# Patient Record
Sex: Female | Born: 1965
Health system: Southern US, Community
[De-identification: ages and names within clinical notes are randomized; demographics above are authoritative.]

## PROBLEM LIST (undated history)

## (undated) DIAGNOSIS — G40909 Epilepsy, unspecified, not intractable, without status epilepticus: Secondary | ICD-10-CM

## (undated) DIAGNOSIS — F419 Anxiety disorder, unspecified: Secondary | ICD-10-CM

## (undated) DIAGNOSIS — R569 Unspecified convulsions: Secondary | ICD-10-CM

## (undated) DIAGNOSIS — F32A Depression, unspecified: Secondary | ICD-10-CM

## (undated) DIAGNOSIS — G039 Meningitis, unspecified: Secondary | ICD-10-CM

## (undated) DIAGNOSIS — K219 Gastro-esophageal reflux disease without esophagitis: Secondary | ICD-10-CM

## (undated) DIAGNOSIS — G43909 Migraine, unspecified, not intractable, without status migrainosus: Secondary | ICD-10-CM

## (undated) DIAGNOSIS — D496 Neoplasm of unspecified behavior of brain: Secondary | ICD-10-CM

## (undated) DIAGNOSIS — F329 Major depressive disorder, single episode, unspecified: Secondary | ICD-10-CM

## (undated) HISTORY — DX: Gastro-esophageal reflux disease without esophagitis: K21.9

## (undated) HISTORY — DX: Neoplasm of unspecified behavior of brain: D49.6

## (undated) HISTORY — DX: Major depressive disorder, single episode, unspecified: F32.9

## (undated) HISTORY — DX: Anxiety disorder, unspecified: F41.9

## (undated) HISTORY — DX: Epilepsy, unspecified, not intractable, without status epilepticus: G40.909

## (undated) HISTORY — DX: Meningitis, unspecified: G03.9

## (undated) HISTORY — DX: Migraine, unspecified, not intractable, without status migrainosus: G43.909

## (undated) HISTORY — DX: Depression, unspecified: F32.A

---

## 1998-03-03 ENCOUNTER — Other Ambulatory Visit: Admission: RE | Admit: 1998-03-03 | Discharge: 1998-03-03 | Payer: Self-pay | Admitting: Obstetrics and Gynecology

## 1998-09-02 ENCOUNTER — Encounter: Payer: Self-pay | Admitting: Obstetrics and Gynecology

## 1998-09-02 ENCOUNTER — Ambulatory Visit (HOSPITAL_COMMUNITY): Admission: RE | Admit: 1998-09-02 | Discharge: 1998-09-02 | Payer: Self-pay | Admitting: Obstetrics and Gynecology

## 1998-09-04 ENCOUNTER — Inpatient Hospital Stay (HOSPITAL_COMMUNITY): Admission: AD | Admit: 1998-09-04 | Discharge: 1998-09-07 | Payer: Self-pay | Admitting: Obstetrics & Gynecology

## 1998-09-04 ENCOUNTER — Encounter: Payer: Self-pay | Admitting: Obstetrics

## 1998-09-09 ENCOUNTER — Inpatient Hospital Stay (HOSPITAL_COMMUNITY): Admission: AD | Admit: 1998-09-09 | Discharge: 1998-09-09 | Payer: Self-pay | Admitting: Obstetrics & Gynecology

## 1998-09-13 ENCOUNTER — Encounter (HOSPITAL_COMMUNITY): Admission: RE | Admit: 1998-09-13 | Discharge: 1998-09-20 | Payer: Self-pay | Admitting: Obstetrics & Gynecology

## 1998-09-19 ENCOUNTER — Encounter: Payer: Self-pay | Admitting: Obstetrics & Gynecology

## 1998-09-19 ENCOUNTER — Inpatient Hospital Stay (HOSPITAL_COMMUNITY): Admission: AD | Admit: 1998-09-19 | Discharge: 1998-10-13 | Payer: Self-pay | Admitting: Obstetrics & Gynecology

## 1998-09-21 ENCOUNTER — Encounter: Payer: Self-pay | Admitting: Nephrology

## 1998-09-23 ENCOUNTER — Encounter: Payer: Self-pay | Admitting: Nephrology

## 1998-09-24 ENCOUNTER — Encounter: Payer: Self-pay | Admitting: *Deleted

## 1998-09-25 ENCOUNTER — Encounter: Payer: Self-pay | Admitting: Nephrology

## 1998-09-28 ENCOUNTER — Encounter: Payer: Self-pay | Admitting: Obstetrics & Gynecology

## 1998-10-01 ENCOUNTER — Encounter: Payer: Self-pay | Admitting: Nephrology

## 1998-10-19 ENCOUNTER — Encounter: Admission: RE | Admit: 1998-10-19 | Discharge: 1998-10-19 | Payer: Self-pay | Admitting: Family Medicine

## 1998-10-27 ENCOUNTER — Emergency Department (HOSPITAL_COMMUNITY): Admission: EM | Admit: 1998-10-27 | Discharge: 1998-10-27 | Payer: Self-pay | Admitting: Emergency Medicine

## 1998-10-28 ENCOUNTER — Encounter: Admission: RE | Admit: 1998-10-28 | Discharge: 1998-10-28 | Payer: Self-pay | Admitting: Sports Medicine

## 1998-11-03 ENCOUNTER — Encounter: Admission: RE | Admit: 1998-11-03 | Discharge: 1998-11-03 | Payer: Self-pay | Admitting: Family Medicine

## 1998-11-08 ENCOUNTER — Encounter: Admission: RE | Admit: 1998-11-08 | Discharge: 1998-11-08 | Payer: Self-pay | Admitting: Family Medicine

## 1998-11-11 ENCOUNTER — Encounter: Admission: RE | Admit: 1998-11-11 | Discharge: 1998-11-11 | Payer: Self-pay | Admitting: Family Medicine

## 1998-11-16 ENCOUNTER — Encounter: Admission: RE | Admit: 1998-11-16 | Discharge: 1998-11-16 | Payer: Self-pay | Admitting: Family Medicine

## 1999-01-03 ENCOUNTER — Encounter: Admission: RE | Admit: 1999-01-03 | Discharge: 1999-01-03 | Payer: Self-pay | Admitting: Family Medicine

## 1999-01-18 ENCOUNTER — Encounter: Admission: RE | Admit: 1999-01-18 | Discharge: 1999-01-18 | Payer: Self-pay | Admitting: Sports Medicine

## 1999-02-08 ENCOUNTER — Encounter: Admission: RE | Admit: 1999-02-08 | Discharge: 1999-02-08 | Payer: Self-pay | Admitting: Family Medicine

## 1999-03-15 ENCOUNTER — Encounter: Admission: RE | Admit: 1999-03-15 | Discharge: 1999-03-15 | Payer: Self-pay | Admitting: Family Medicine

## 1999-03-31 ENCOUNTER — Other Ambulatory Visit: Admission: RE | Admit: 1999-03-31 | Discharge: 1999-03-31 | Payer: Self-pay | Admitting: Family Medicine

## 1999-03-31 ENCOUNTER — Encounter: Admission: RE | Admit: 1999-03-31 | Discharge: 1999-03-31 | Payer: Self-pay | Admitting: Family Medicine

## 1999-05-09 ENCOUNTER — Encounter: Admission: RE | Admit: 1999-05-09 | Discharge: 1999-05-09 | Payer: Self-pay | Admitting: Family Medicine

## 1999-05-31 ENCOUNTER — Encounter: Admission: RE | Admit: 1999-05-31 | Discharge: 1999-05-31 | Payer: Self-pay | Admitting: Family Medicine

## 1999-05-31 ENCOUNTER — Other Ambulatory Visit: Admission: RE | Admit: 1999-05-31 | Discharge: 1999-05-31 | Payer: Self-pay | Admitting: Family Medicine

## 2000-03-19 ENCOUNTER — Encounter: Admission: RE | Admit: 2000-03-19 | Discharge: 2000-03-19 | Payer: Self-pay | Admitting: Family Medicine

## 2000-05-08 ENCOUNTER — Encounter: Admission: RE | Admit: 2000-05-08 | Discharge: 2000-05-08 | Payer: Self-pay | Admitting: Sports Medicine

## 2000-06-21 ENCOUNTER — Encounter: Admission: RE | Admit: 2000-06-21 | Discharge: 2000-06-21 | Payer: Self-pay | Admitting: Family Medicine

## 2000-10-05 ENCOUNTER — Encounter: Admission: RE | Admit: 2000-10-05 | Discharge: 2000-10-05 | Payer: Self-pay | Admitting: Family Medicine

## 2000-12-06 ENCOUNTER — Emergency Department (HOSPITAL_COMMUNITY): Admission: EM | Admit: 2000-12-06 | Discharge: 2000-12-06 | Payer: Self-pay | Admitting: Emergency Medicine

## 2001-01-01 ENCOUNTER — Encounter: Admission: RE | Admit: 2001-01-01 | Discharge: 2001-01-01 | Payer: Self-pay | Admitting: Family Medicine

## 2001-10-24 ENCOUNTER — Encounter: Admission: RE | Admit: 2001-10-24 | Discharge: 2001-10-24 | Payer: Self-pay | Admitting: Family Medicine

## 2002-01-27 ENCOUNTER — Encounter: Admission: RE | Admit: 2002-01-27 | Discharge: 2002-01-27 | Payer: Self-pay | Admitting: Family Medicine

## 2002-02-20 ENCOUNTER — Ambulatory Visit (HOSPITAL_COMMUNITY): Admission: RE | Admit: 2002-02-20 | Discharge: 2002-02-20 | Payer: Self-pay | Admitting: Family Medicine

## 2002-03-14 ENCOUNTER — Encounter: Admission: RE | Admit: 2002-03-14 | Discharge: 2002-03-14 | Payer: Self-pay | Admitting: Family Medicine

## 2002-03-31 ENCOUNTER — Encounter: Admission: RE | Admit: 2002-03-31 | Discharge: 2002-03-31 | Payer: Self-pay | Admitting: Family Medicine

## 2002-09-01 ENCOUNTER — Emergency Department (HOSPITAL_COMMUNITY): Admission: EM | Admit: 2002-09-01 | Discharge: 2002-09-01 | Payer: Self-pay | Admitting: Emergency Medicine

## 2002-09-12 ENCOUNTER — Encounter: Admission: RE | Admit: 2002-09-12 | Discharge: 2002-09-12 | Payer: Self-pay | Admitting: Family Medicine

## 2002-12-09 ENCOUNTER — Encounter: Admission: RE | Admit: 2002-12-09 | Discharge: 2002-12-09 | Payer: Self-pay | Admitting: Sports Medicine

## 2003-01-08 ENCOUNTER — Encounter: Admission: RE | Admit: 2003-01-08 | Discharge: 2003-01-08 | Payer: Self-pay | Admitting: Family Medicine

## 2003-02-18 ENCOUNTER — Encounter: Admission: RE | Admit: 2003-02-18 | Discharge: 2003-02-18 | Payer: Self-pay | Admitting: Family Medicine

## 2003-02-23 ENCOUNTER — Ambulatory Visit (HOSPITAL_COMMUNITY): Admission: RE | Admit: 2003-02-23 | Discharge: 2003-02-23 | Payer: Self-pay | Admitting: Sports Medicine

## 2003-02-26 ENCOUNTER — Encounter: Admission: RE | Admit: 2003-02-26 | Discharge: 2003-02-26 | Payer: Self-pay | Admitting: Family Medicine

## 2003-03-04 ENCOUNTER — Encounter: Admission: RE | Admit: 2003-03-04 | Discharge: 2003-03-04 | Payer: Self-pay | Admitting: Sports Medicine

## 2003-06-29 ENCOUNTER — Encounter: Admission: RE | Admit: 2003-06-29 | Discharge: 2003-06-29 | Payer: Self-pay | Admitting: Family Medicine

## 2004-05-09 ENCOUNTER — Ambulatory Visit (HOSPITAL_COMMUNITY): Admission: RE | Admit: 2004-05-09 | Discharge: 2004-05-09 | Payer: Self-pay | Admitting: Family Medicine

## 2004-06-27 ENCOUNTER — Emergency Department (HOSPITAL_COMMUNITY): Admission: EM | Admit: 2004-06-27 | Discharge: 2004-06-27 | Payer: Self-pay | Admitting: Emergency Medicine

## 2004-06-27 IMAGING — CT CT HEAD W/O CM
1 of 2 series · 13 of 30 positions shown, 17 images · IV contrast (agent unspecified)
Comparison: Previous study of [DATE] and to the report of the MRI of the head of [DATE].

CLINICAL DATA: Dizzy.  Weakness.  Headache.  Seizure yesterday.  History of seizures. 
 CT HEAD WITHOUT CONTRAST:

[Series 2: brain · axial · 0.47mm/px · z∈[+163,+284]mm · 13 of 28 slices shown, 17 images]
[im 2/28  brain]
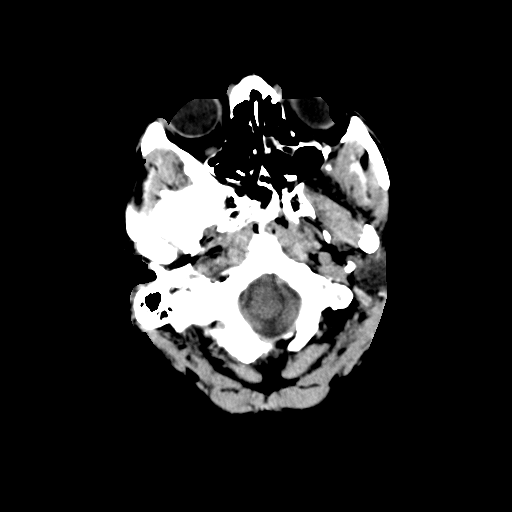
[im 2/28  bone]
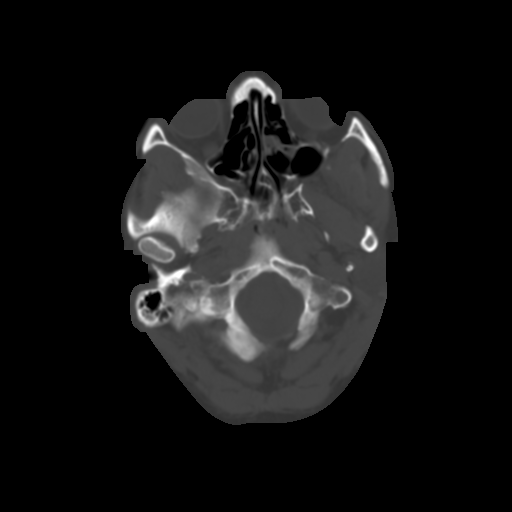
[im 4/28  brain]
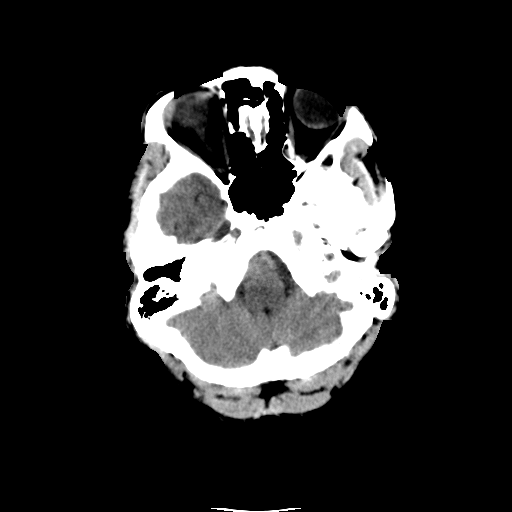
[im 6/28  brain]
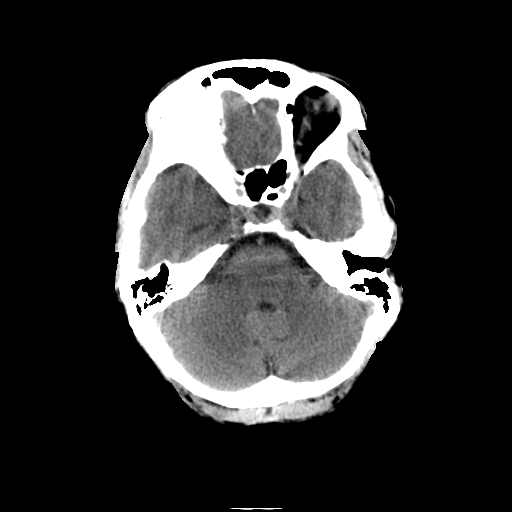
[im 8/28  brain]
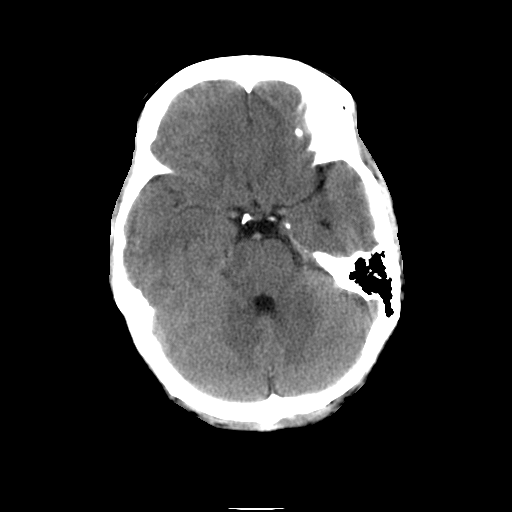
[im 10/28  brain]
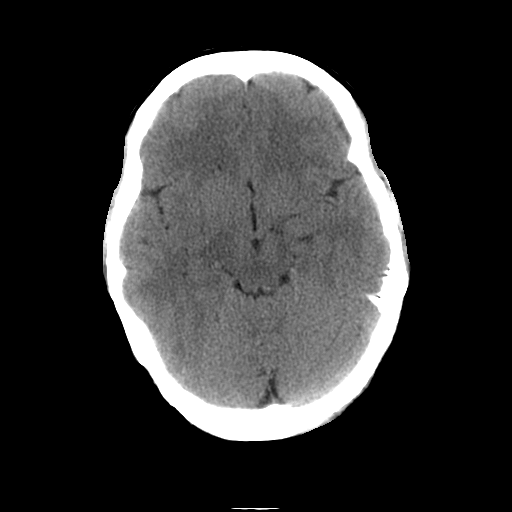
[im 10/28  bone]
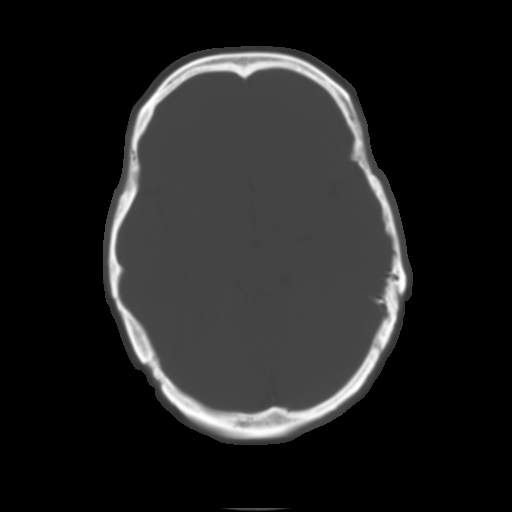
[im 12/28  brain]
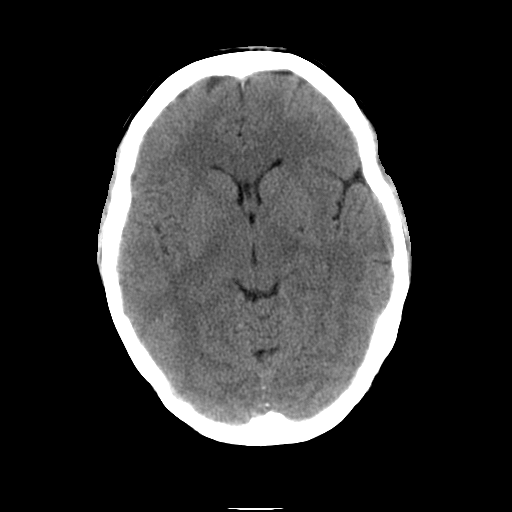
[im 14/28  brain]
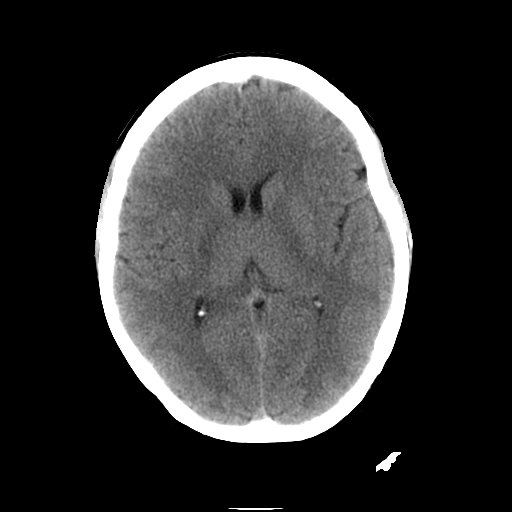
[im 16/28  brain]
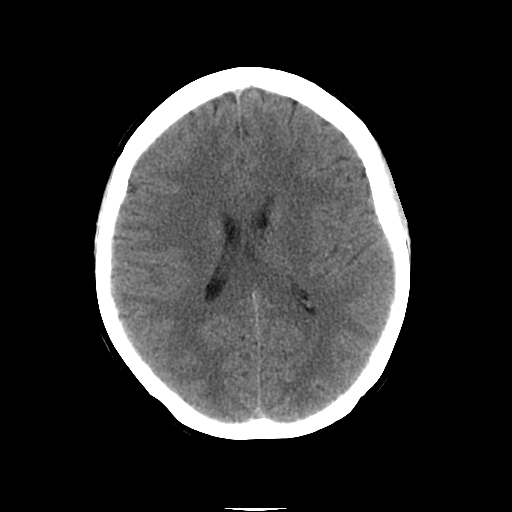
[im 18/28  brain]
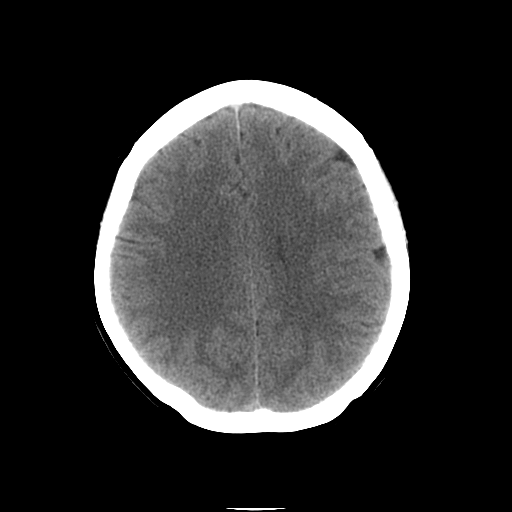
[im 18/28  bone]
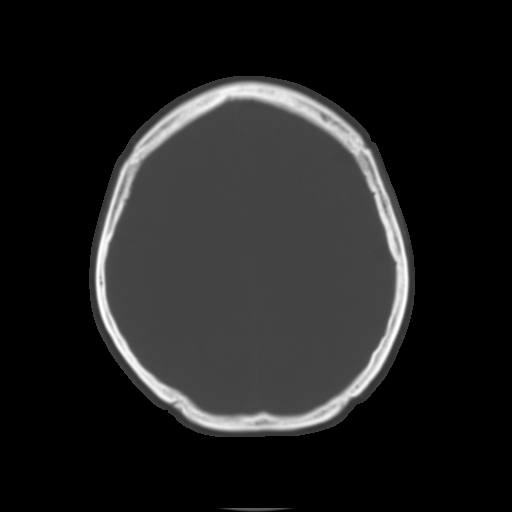
[im 20/28  brain]
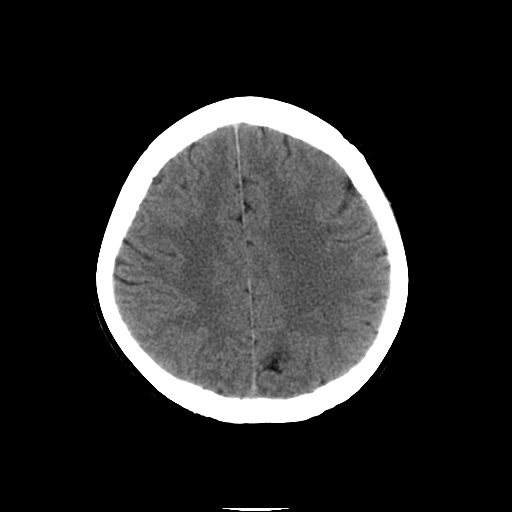
[im 22/28  brain]
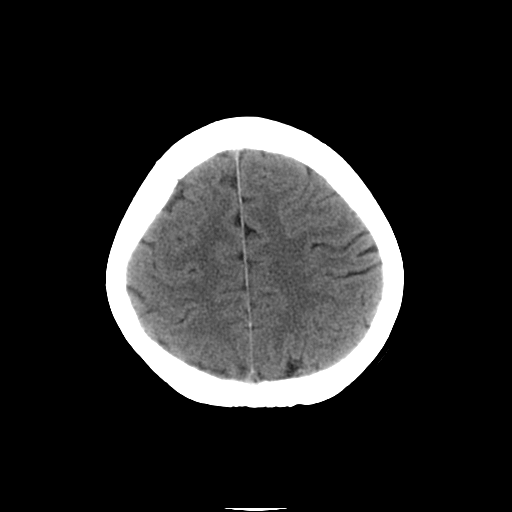
[im 24/28  brain]
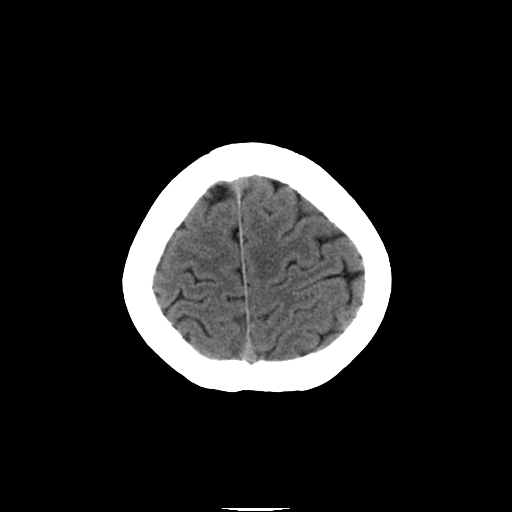
[im 26/28  brain]
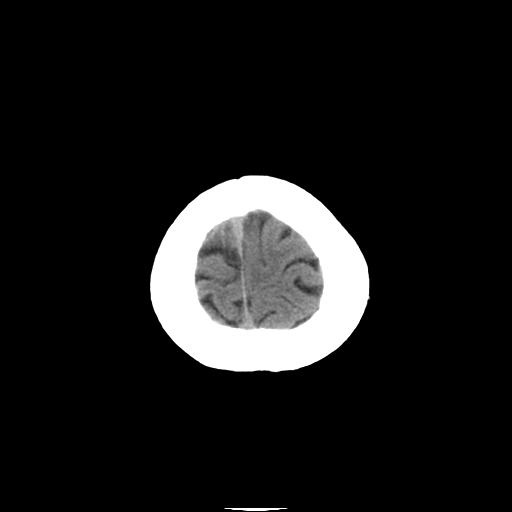
[im 26/28  bone]
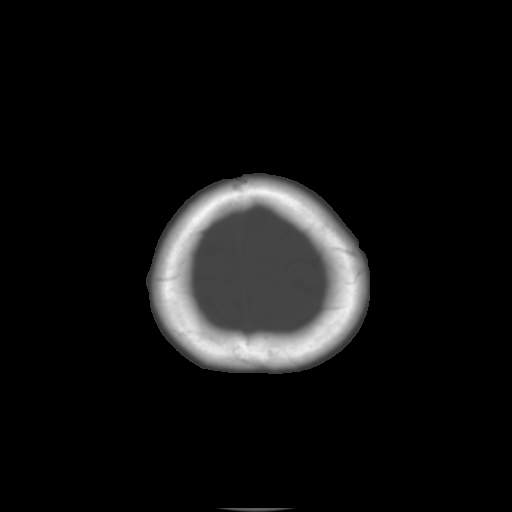

[13 of 30 positions shown; findings below may reference images not displayed]

A series of scans of the entire head are made without contrast and show that the area of low density in the left posteroparietal region near the vertex has not changed since [DATE].  There is no evidence of intracranial hemorrhage or mass.  There is no shift in the midline structures.  The ventricular system appears normal.  The bony calvarium is intact.  The base of the skull, internal auditory canals, and the paranasal sinuses appear normal as far as can be seen.
IMPRESSION: Stable area of low density, which may be an old area of brain atrophy, left posteroparietal region.  No acute intracranial findings are present.

## 2004-09-15 ENCOUNTER — Encounter (INDEPENDENT_AMBULATORY_CARE_PROVIDER_SITE_OTHER): Payer: Self-pay | Admitting: *Deleted

## 2004-09-15 LAB — CONVERTED CEMR LAB

## 2004-09-27 ENCOUNTER — Ambulatory Visit: Payer: Self-pay | Admitting: Family Medicine

## 2005-04-06 ENCOUNTER — Ambulatory Visit: Payer: Self-pay | Admitting: Sports Medicine

## 2005-04-08 ENCOUNTER — Emergency Department (HOSPITAL_COMMUNITY): Admission: EM | Admit: 2005-04-08 | Discharge: 2005-04-08 | Payer: Self-pay | Admitting: Emergency Medicine

## 2005-04-08 IMAGING — CR DG LUMBAR SPINE COMPLETE 4+V
5 series · 5 of 5 positions shown · non-contrast
Comparison: None.
 LUMBAR SPINE - 4 VIEW:
COMPARISON: None.

CLINICAL DATA: Low back pain.

[t l-spine a.p.]
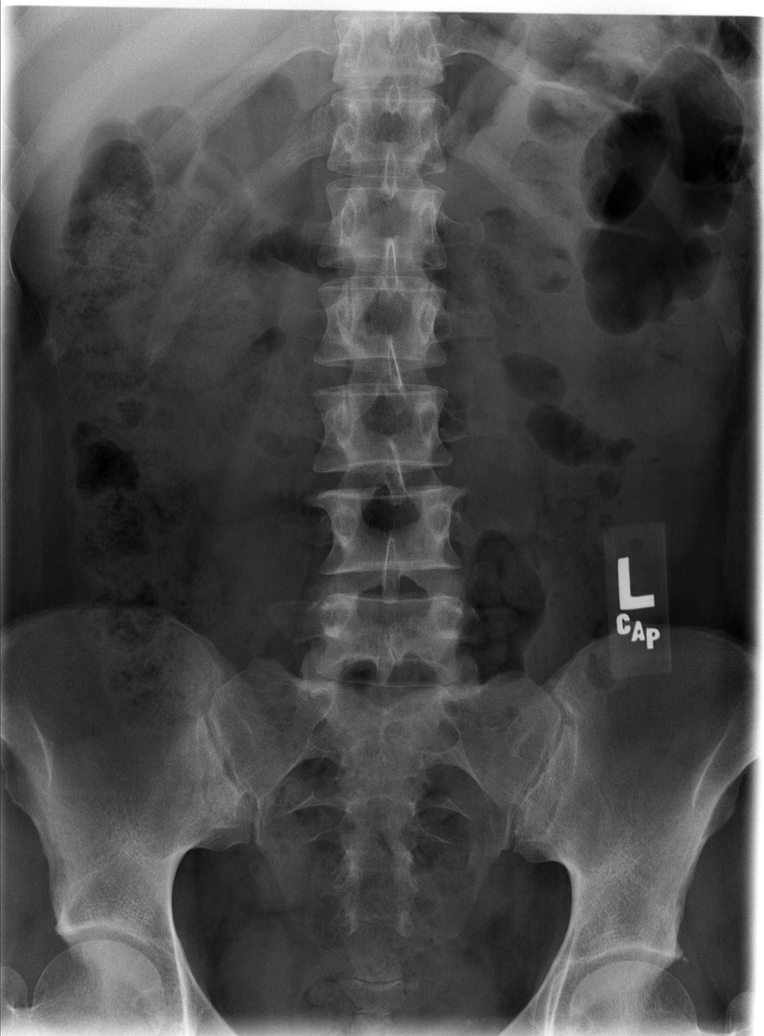

[t l-spine oblique exposure (1 of 2)]
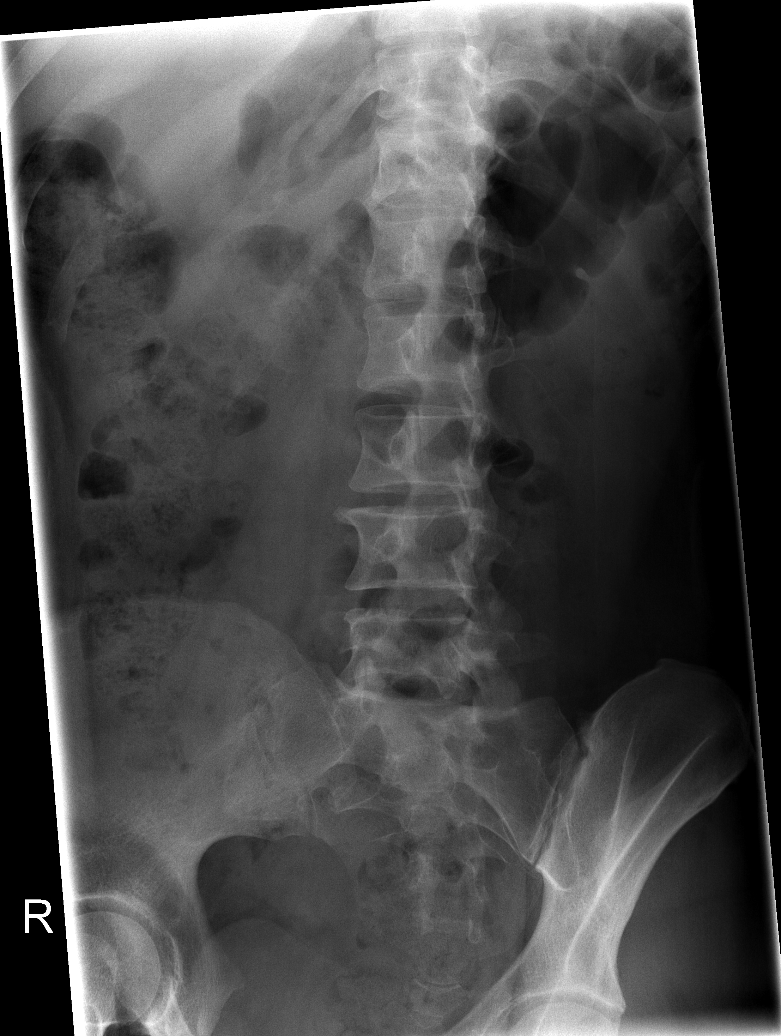

[t l-spine oblique exposure (2 of 2)]
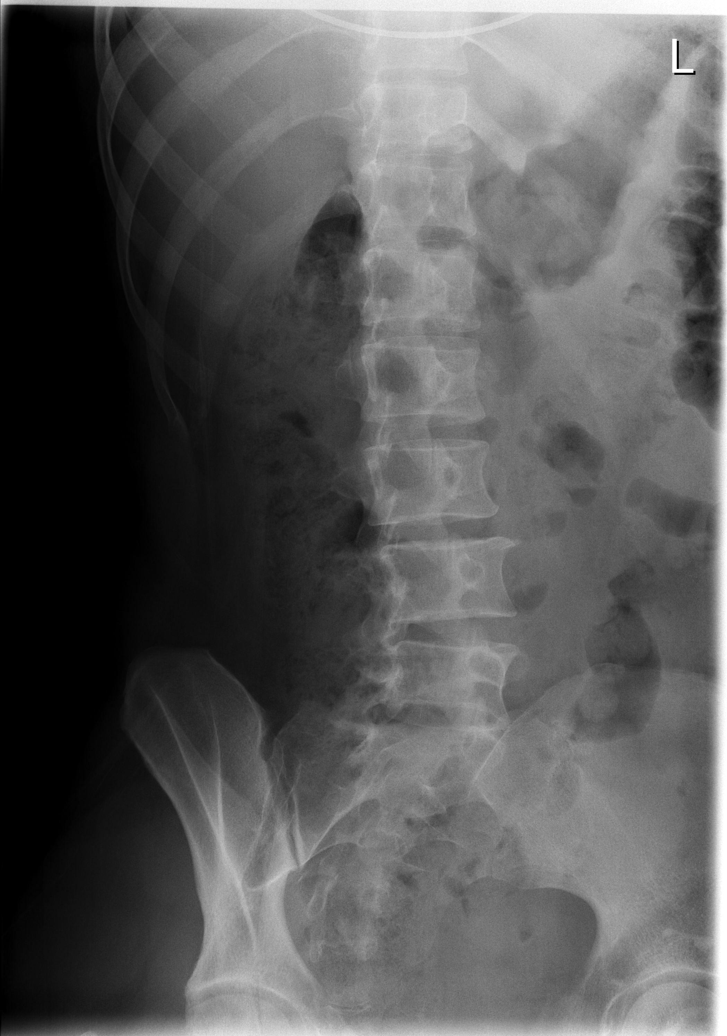

[t l-spine lat]
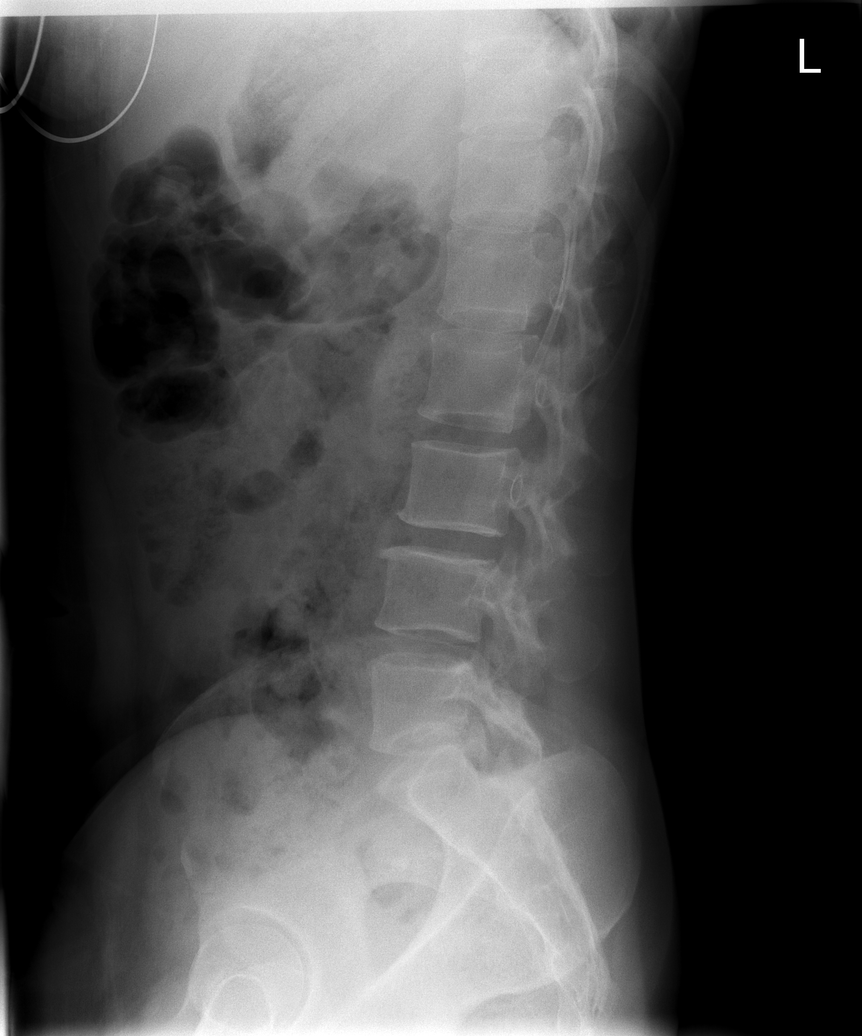

[t l-spine l5-s1 spot]
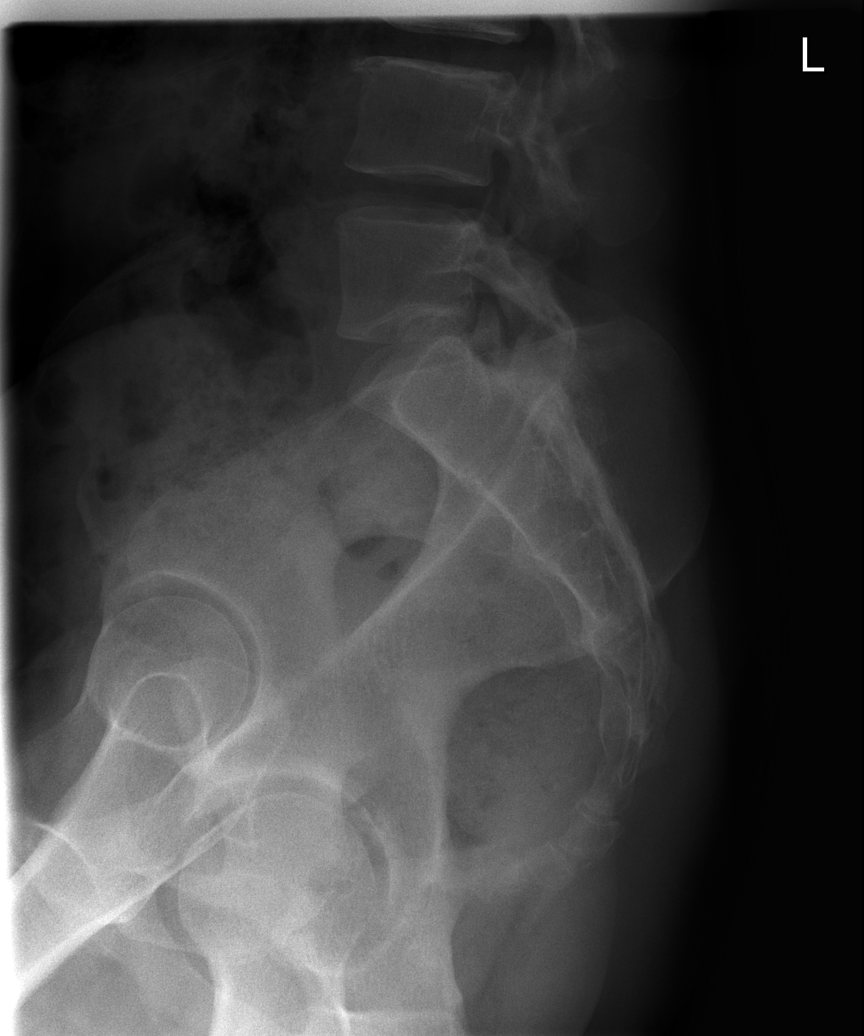

[5 of 5 positions shown; findings below may reference images not displayed]

FINDINGS: The alignment of the lumbar spine is normal.  
 The vertebral body heights and disk spaces are well preserved.
 No acute fractures are identified.
IMPRESSION: No acute findings.

## 2005-05-10 ENCOUNTER — Ambulatory Visit: Payer: Self-pay | Admitting: Family Medicine

## 2005-05-22 ENCOUNTER — Ambulatory Visit: Payer: Self-pay | Admitting: Sports Medicine

## 2005-05-22 ENCOUNTER — Encounter: Admission: RE | Admit: 2005-05-22 | Discharge: 2005-06-13 | Payer: Self-pay | Admitting: Family Medicine

## 2005-08-10 ENCOUNTER — Ambulatory Visit (HOSPITAL_COMMUNITY): Admission: RE | Admit: 2005-08-10 | Discharge: 2005-08-10 | Payer: Self-pay | Admitting: Family Medicine

## 2005-08-10 ENCOUNTER — Ambulatory Visit: Payer: Self-pay | Admitting: Family Medicine

## 2005-08-10 IMAGING — MG MM DIGITAL SCREENING BILAT
4 series · 4 of 4 positions shown · non-contrast
Comparison: none

DG SCREEN MAMMOGRAM BILATERAL
Bilateral CC and MLO view(s) were taken.

SCREENING MAMMOGRAM:
There is a fibroglandular pattern.  No masses or malignant type calcifications are identified.  
Compared with prior studies.

[R CC]
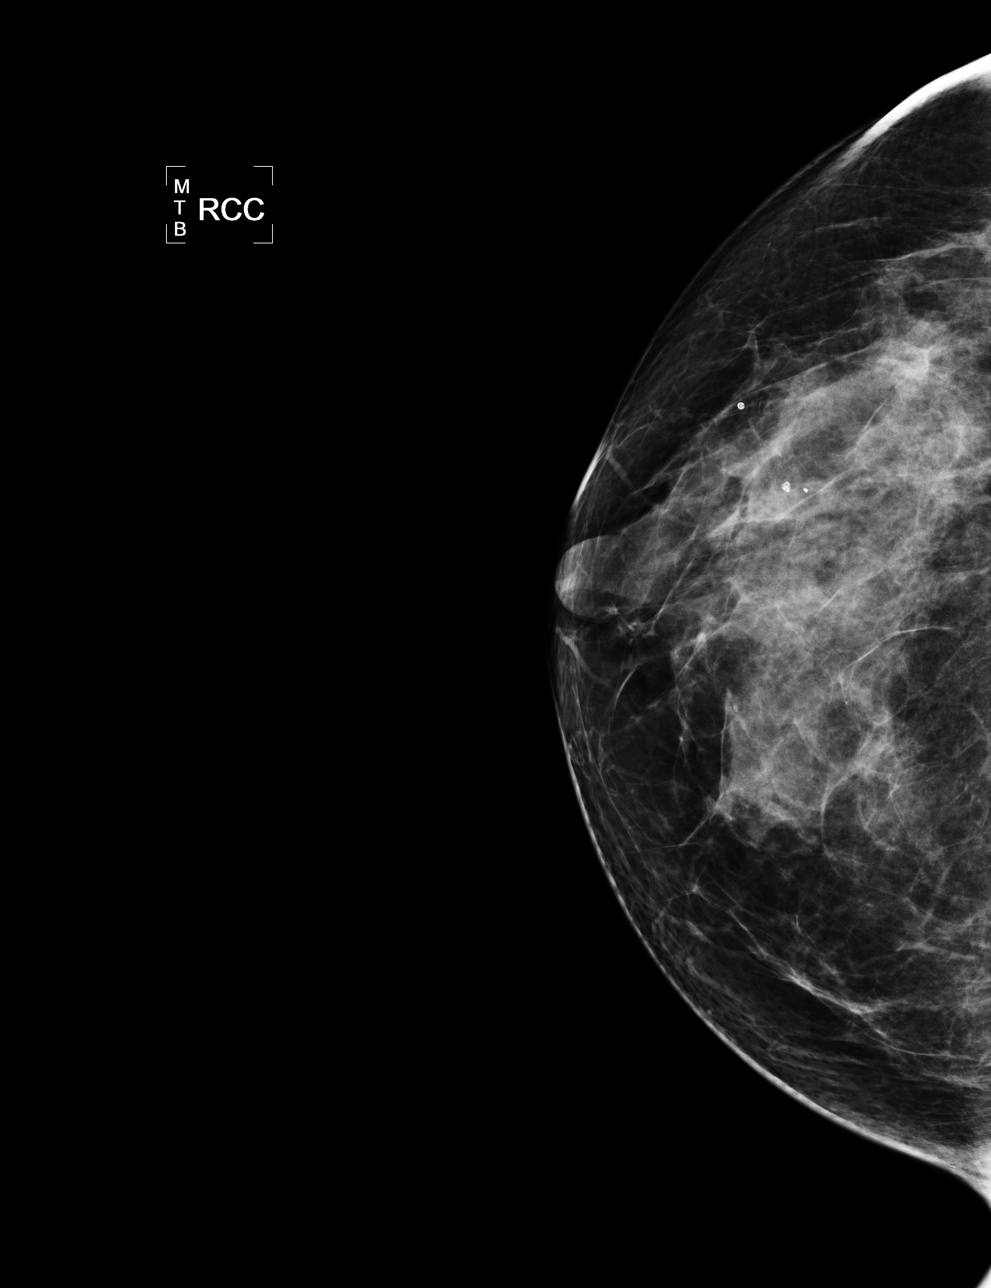

[R MLO]
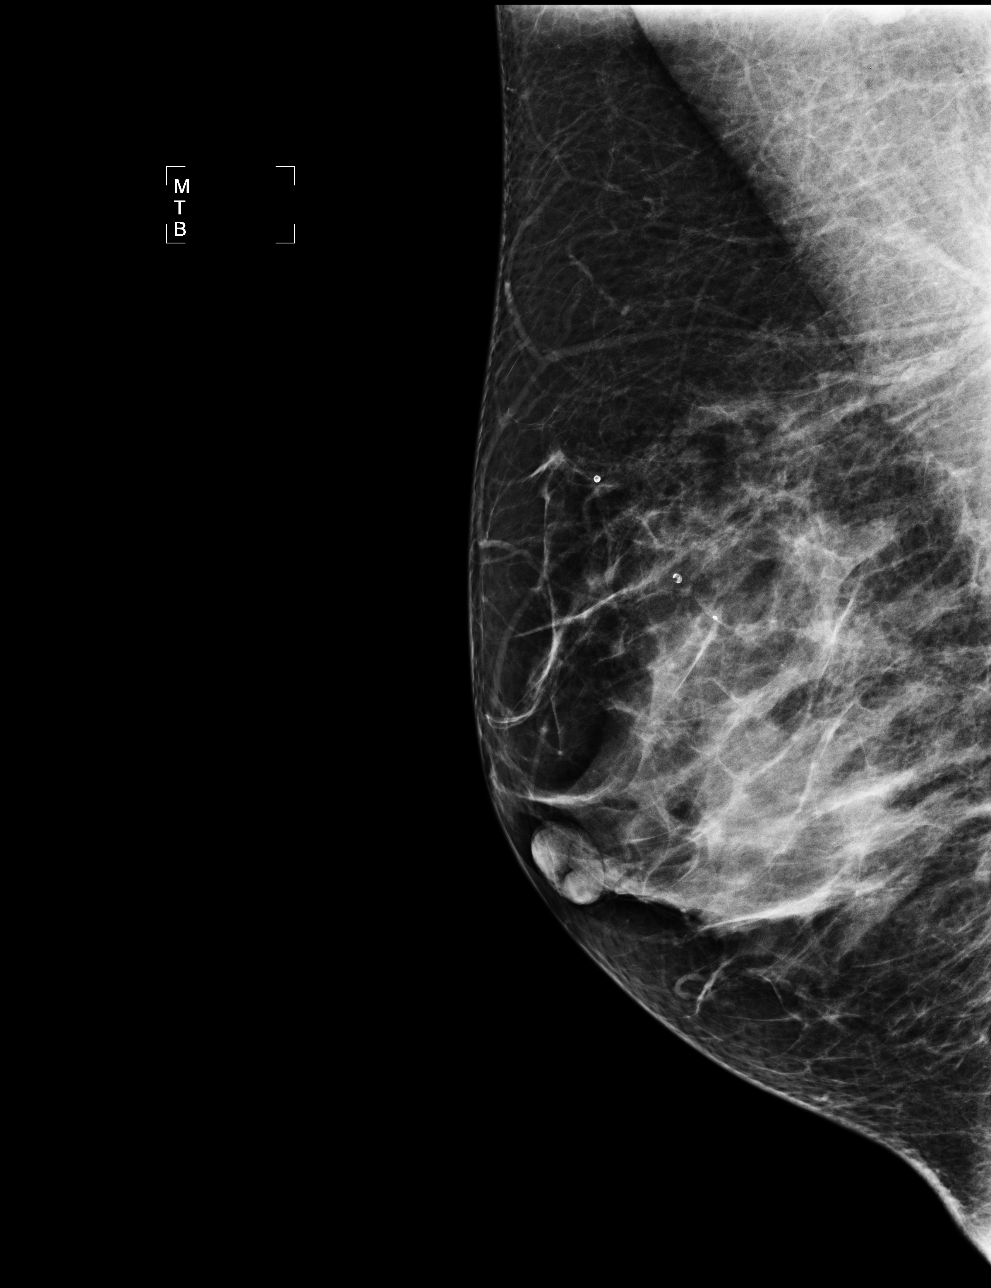

[L CC]
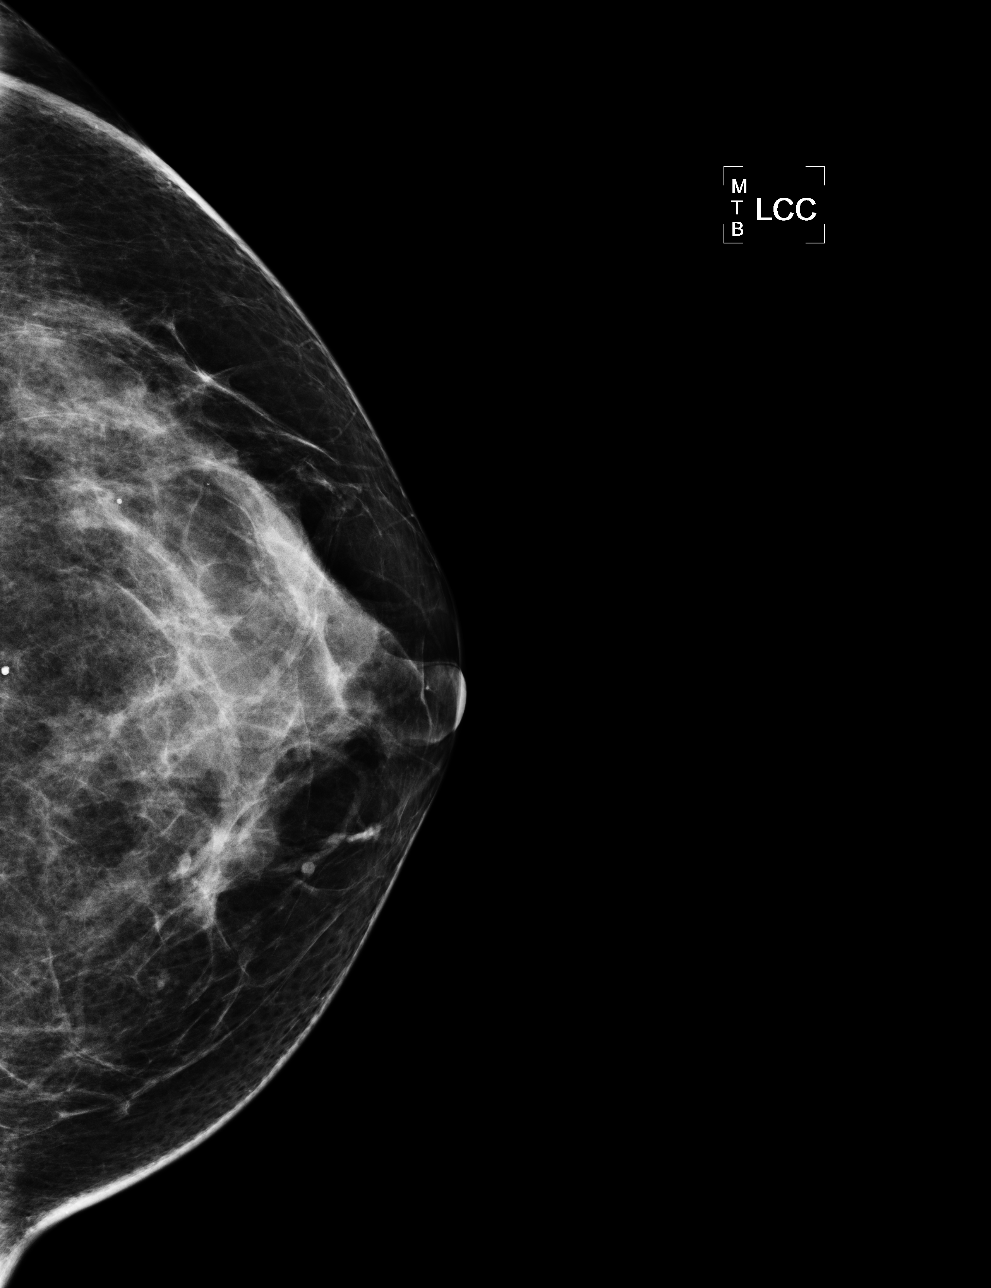

[L MLO]
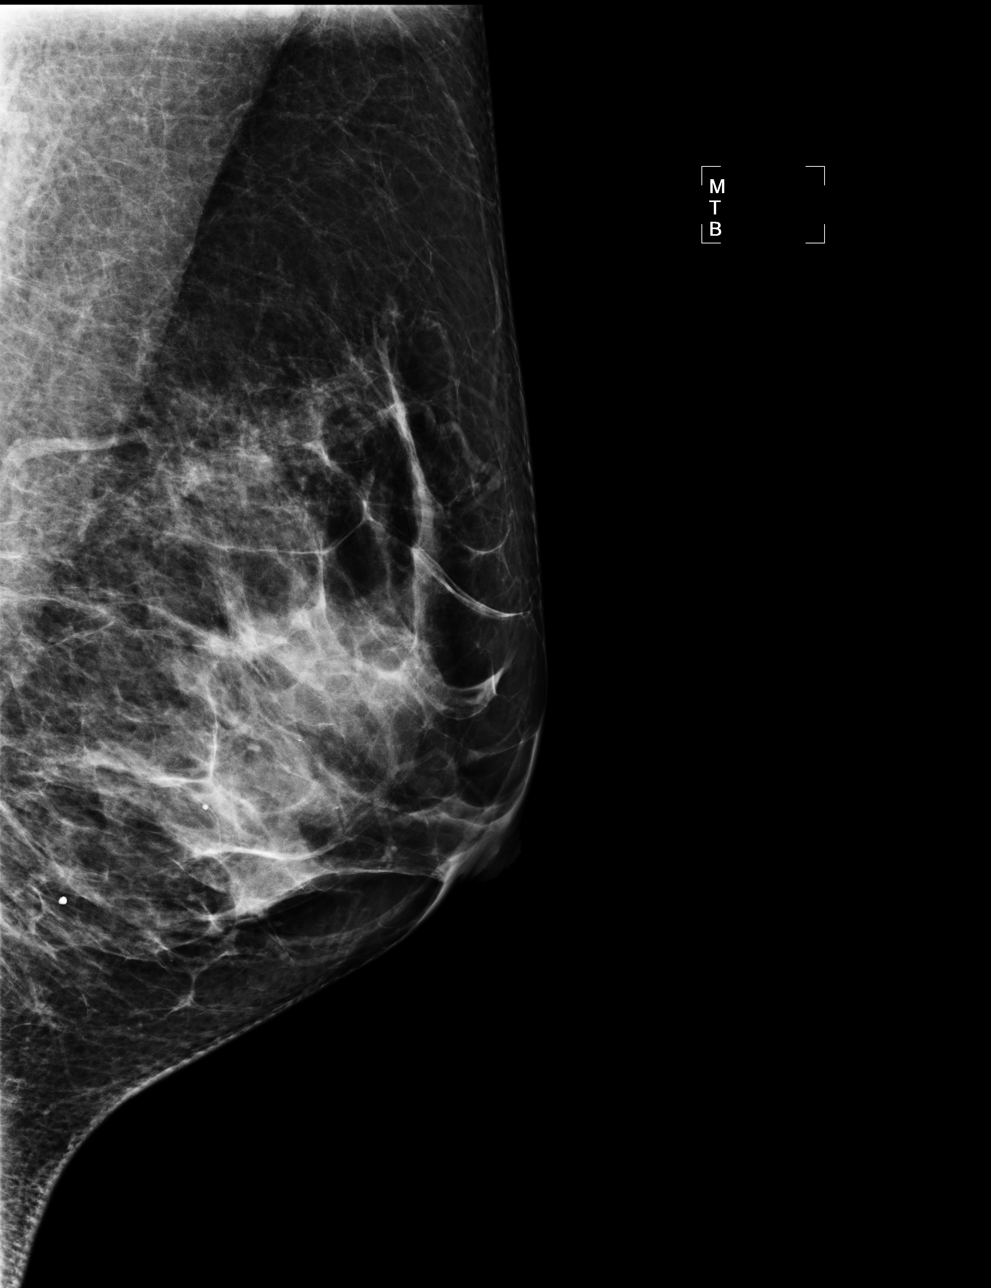

[4 of 4 positions shown; findings below may reference images not displayed]

IMPRESSION: No specific mammographic evidence of malignancy.  Next screening mammogram is recommended in one 
year.

ASSESSMENT: Negative - BI-RADS 1

Screening mammogram in 1 year.
ANALYZED BY COMPUTER AIDED DETECTION. , THIS PROCEDURE WAS A DIGITAL MAMMOGRAM.

## 2005-08-31 ENCOUNTER — Ambulatory Visit (HOSPITAL_COMMUNITY): Admission: RE | Admit: 2005-08-31 | Discharge: 2005-08-31 | Payer: Self-pay | Admitting: Sports Medicine

## 2005-09-05 ENCOUNTER — Ambulatory Visit (HOSPITAL_BASED_OUTPATIENT_CLINIC_OR_DEPARTMENT_OTHER): Admission: RE | Admit: 2005-09-05 | Discharge: 2005-09-05 | Payer: Self-pay | Admitting: Family Medicine

## 2005-09-10 ENCOUNTER — Ambulatory Visit: Payer: Self-pay | Admitting: Internal Medicine

## 2005-11-17 ENCOUNTER — Ambulatory Visit: Payer: Self-pay

## 2006-02-21 ENCOUNTER — Ambulatory Visit: Payer: Self-pay | Admitting: Family Medicine

## 2006-02-26 ENCOUNTER — Ambulatory Visit: Payer: Self-pay | Admitting: Sports Medicine

## 2006-05-11 ENCOUNTER — Encounter (INDEPENDENT_AMBULATORY_CARE_PROVIDER_SITE_OTHER): Payer: Self-pay | Admitting: *Deleted

## 2006-06-29 ENCOUNTER — Telehealth (INDEPENDENT_AMBULATORY_CARE_PROVIDER_SITE_OTHER): Payer: Self-pay | Admitting: Family Medicine

## 2006-07-04 ENCOUNTER — Telehealth (INDEPENDENT_AMBULATORY_CARE_PROVIDER_SITE_OTHER): Payer: Self-pay | Admitting: Family Medicine

## 2006-07-05 ENCOUNTER — Encounter (INDEPENDENT_AMBULATORY_CARE_PROVIDER_SITE_OTHER): Payer: Self-pay | Admitting: Family Medicine

## 2007-12-01 DIAGNOSIS — Z209 Contact with and (suspected) exposure to unspecified communicable disease: Secondary | ICD-10-CM | POA: Insufficient documentation

## 2011-03-14 DIAGNOSIS — G039 Meningitis, unspecified: Secondary | ICD-10-CM

## 2011-03-14 DIAGNOSIS — D496 Neoplasm of unspecified behavior of brain: Secondary | ICD-10-CM

## 2011-03-14 HISTORY — DX: Neoplasm of unspecified behavior of brain: D49.6

## 2011-03-14 HISTORY — DX: Meningitis, unspecified: G03.9

## 2011-04-11 ENCOUNTER — Emergency Department (HOSPITAL_COMMUNITY)
Admission: EM | Admit: 2011-04-11 | Discharge: 2011-04-11 | Disposition: A | Discharge status: Home / Self Care | Payer: PRIVATE HEALTH INSURANCE | Source: Home / Self Care | Attending: Family Medicine | Admitting: Family Medicine

## 2011-04-11 ENCOUNTER — Encounter (HOSPITAL_COMMUNITY): Payer: Self-pay | Admitting: Emergency Medicine

## 2011-04-11 DIAGNOSIS — J069 Acute upper respiratory infection, unspecified: Secondary | ICD-10-CM

## 2011-04-11 HISTORY — DX: Unspecified convulsions: R56.9

## 2011-04-11 MED ORDER — CETIRIZINE HCL 10 MG PO TABS: 10.0000 mg | ORAL_TABLET | Freq: Every day | ORAL | Status: DC

## 2011-04-11 MED ORDER — IPRATROPIUM BROMIDE 0.06 % NA SOLN: 2.0000 | Freq: Four times a day (QID) | NASAL | Status: DC

## 2011-04-11 NOTE — ED Provider Notes (Signed)
History     CSN: [id number]  Arrival date & time 04/11/11  1218   First MD Initiated Contact with Patient 04/11/11 1221      Chief Complaint  Patient presents with  . URI    (Consider location/radiation/quality/duration/timing/severity/associated sxs/prior treatment) Patient is a 46 y.o. female presenting with URI. The history is provided by the patient.  URI Primary symptoms do not include fever, sore throat, cough or wheezing. The current episode started 2 days ago. This is a new problem. The problem has not changed since onset. The onset of the illness is associated with exposure to sick contacts. Symptoms associated with the illness include congestion and rhinorrhea.    History reviewed. No pertinent past medical history.  History reviewed. No pertinent past surgical history.  No family history on file.  History  Substance Use Topics  . Smoking status: Not on file  . Smokeless tobacco: Not on file  . Alcohol Use: Not on file    OB History    Grav Para Term Preterm Abortions TAB SAB Ect Mult Living                  Review of Systems  Constitutional: Negative.  Negative for fever.  HENT: Positive for congestion, rhinorrhea and postnasal drip. Negative for sore throat.   Respiratory: Negative for cough and wheezing.   Gastrointestinal: Negative.     Allergies  Review of patient's allergies indicates no known allergies.  Home Medications   Current Outpatient Rx  Name Route Sig Dispense Refill  . CETIRIZINE HCL 10 MG PO TABS Oral Take 1 tablet (10 mg total) by mouth daily. 30 tablet 1  . IPRATROPIUM BROMIDE 0.06 % NA SOLN Nasal Place 2 sprays into the nose 4 (four) times daily. 15 mL 12    BP 93/58  Pulse 85  Temp(Src) 98.4 F (36.9 C) (Oral)  Resp 18  SpO2 100%  LMP 03/27/2011  Physical Exam  Nursing note and vitals reviewed. Constitutional: She appears well-developed and well-nourished.  HENT:  Head: Normocephalic.  Right Ear: External ear  normal.  Left Ear: External ear normal.  Nose: Mucosal edema and rhinorrhea present.  Mouth/Throat: Oropharynx is clear and moist.  Neck: Normal range of motion. Neck supple.  Pulmonary/Chest: Effort normal and breath sounds normal.  Lymphadenopathy:    She has no cervical adenopathy.  Skin: Skin is warm and dry.    ED Course  Procedures (including critical care time)  Labs Reviewed - No data to display No results found.   1. URI (upper respiratory infection)       MDM          Barkley Bruns, MD 04/11/11 [phone]

## 2011-04-11 NOTE — ED Notes (Signed)
Reports 2 day history of : head and throat congestion, runny nose, post nasal drainage, facial pressure and pain.  Also c/o sore throat, dry cough, denies fever.

## 2012-03-13 HISTORY — PX: CT RADIATION THERAPY GUIDE: HXRAD513

## 2012-03-13 HISTORY — PX: BRAIN SURGERY: SHX531

## 2015-08-13 DIAGNOSIS — K2100 Gastro-esophageal reflux disease with esophagitis, without bleeding: Secondary | ICD-10-CM | POA: Insufficient documentation

## 2015-08-17 DIAGNOSIS — A048 Other specified bacterial intestinal infections: Secondary | ICD-10-CM | POA: Insufficient documentation

## 2015-10-01 DIAGNOSIS — G43909 Migraine, unspecified, not intractable, without status migrainosus: Secondary | ICD-10-CM | POA: Insufficient documentation

## 2015-10-01 DIAGNOSIS — G40109 Localization-related (focal) (partial) symptomatic epilepsy and epileptic syndromes with simple partial seizures, not intractable, without status epilepticus: Secondary | ICD-10-CM | POA: Insufficient documentation

## 2015-10-01 LAB — BASIC METABOLIC PANEL
BUN: 14 (ref 4–21)
Creatinine: 0.8 (ref <=1.1)
GLUCOSE: 92
POTASSIUM: 3.8 (ref 3.4–5.3)
SODIUM: 142 (ref 137–147)

## 2015-10-01 LAB — HEPATIC FUNCTION PANEL
ALT: 20 (ref 7–35)
AST: 19 (ref 13–35)
Alkaline Phosphatase: 51 (ref 25–125)
BILIRUBIN, TOTAL: 0.6

## 2015-10-08 LAB — CBC AND DIFFERENTIAL
HEMATOCRIT: 37 (ref 36–46)
HEMOGLOBIN: 12.4 (ref 12.0–16.0)
Platelets: 269 (ref 150–399)
WBC: 5.4

## 2016-04-21 LAB — TSH: TSH: 1.63 (ref <=5.90)

## 2016-06-23 DIAGNOSIS — M7989 Other specified soft tissue disorders: Secondary | ICD-10-CM | POA: Insufficient documentation

## 2016-06-23 DIAGNOSIS — F339 Major depressive disorder, recurrent, unspecified: Secondary | ICD-10-CM | POA: Insufficient documentation

## 2016-07-07 DIAGNOSIS — D329 Benign neoplasm of meninges, unspecified: Secondary | ICD-10-CM | POA: Insufficient documentation

## 2016-07-07 LAB — HM PAP SMEAR: HM PAP: NEGATIVE

## 2016-11-03 MED FILL — levETIRAcetam 500 MG TABS: 500 | 90 days supply | Qty: 180 | Fill #0

## 2016-11-03 MED FILL — TOPIRAMATE 50 MG TABLET: 50 | 90 days supply | Qty: 180 | Fill #0

## 2017-01-29 MED FILL — TOPIRAMATE 50 MG TABLET: 50 | 90 days supply | Qty: 180 | Fill #1

## 2017-02-12 ENCOUNTER — Ambulatory Visit: Payer: Self-pay | Admitting: Family Medicine

## 2017-02-12 ENCOUNTER — Encounter: Payer: Self-pay | Admitting: Family Medicine

## 2017-02-12 ENCOUNTER — Other Ambulatory Visit: Payer: Self-pay | Admitting: *Deleted

## 2017-02-12 VITALS — BP 92/60 | HR 86 | Temp 98.2°F | Ht 62.5 in | Wt 135.5 lb

## 2017-02-12 DIAGNOSIS — G8929 Other chronic pain: Secondary | ICD-10-CM | POA: Insufficient documentation

## 2017-02-12 DIAGNOSIS — D171 Benign lipomatous neoplasm of skin and subcutaneous tissue of trunk: Secondary | ICD-10-CM | POA: Insufficient documentation

## 2017-02-12 DIAGNOSIS — Z23 Encounter for immunization: Secondary | ICD-10-CM

## 2017-02-12 DIAGNOSIS — G40109 Localization-related (focal) (partial) symptomatic epilepsy and epileptic syndromes with simple partial seizures, not intractable, without status epilepticus: Secondary | ICD-10-CM

## 2017-02-12 DIAGNOSIS — F339 Major depressive disorder, recurrent, unspecified: Secondary | ICD-10-CM

## 2017-02-12 DIAGNOSIS — D329 Benign neoplasm of meninges, unspecified: Secondary | ICD-10-CM

## 2017-02-12 DIAGNOSIS — M545 Low back pain, unspecified: Secondary | ICD-10-CM | POA: Insufficient documentation

## 2017-02-12 MED ORDER — TOPIRAMATE 50 MG PO TABS: 50.0000 mg | ORAL_TABLET | Freq: Two times a day (BID) | ORAL | 1 refills | Status: DC

## 2017-02-12 MED ORDER — SERTRALINE HCL 50 MG PO TABS: 150.0000 mg | ORAL_TABLET | Freq: Every day | ORAL | 5 refills | Status: DC

## 2017-02-12 MED ORDER — SUMATRIPTAN SUCCINATE 100 MG PO TABS: ORAL_TABLET | ORAL | 1 refills | Status: DC

## 2017-02-12 MED FILL — SERTRALINE HCL 50 MG TABLET: 50 | 60 days supply | Qty: 180 | Fill #0

## 2017-02-12 MED FILL — SUMATRIPTAN SUCC 100 MG TAB: 100 | 30 days supply | Qty: 10 | Fill #0

## 2017-02-12 NOTE — Progress Notes (Signed)
Subjective  CC:  Chief Complaint  Patient presents with  . Establish Care    Self pay    HPI: Victoria Bishop is a 51 y.o. female who presents to [place] [facility] today to establish care with me as a new patient. She recently moved back to [place] from [place]. She was followed by [facility] in the past.  She has the following concerns or needs:   New dx of depression and anxiety since march of 2018 - started on sertraline which has been helpful. Reports sxs of low mood but more related to constant worry interfering with sleep. She and daughter relate much of this to being in [place] away from her family who live here. She spent most days alone while her husband was at work. She denies a prior h/o mood disorder; she denies prior RX meds. No SI. The sertraline has improved her anxiety but not completely. Still with sleep problems. Her phq-9 and GAD were positive.  Depression screen PHQ 2/9 02/12/2017  Decreased Interest 2  Down, Depressed, Hopeless 2  PHQ - 2 Score 4  Altered sleeping 2  Tired, decreased energy 1  Change in appetite 1  Feeling bad or failure about yourself  0  Trouble concentrating 1  Moving slowly or fidgety/restless 3  Suicidal thoughts 1  PHQ-9 Score 13  Difficult doing work/chores Very difficult   GAD 7 : Generalized Anxiety Score 02/12/2017  Nervous, Anxious, on Edge 2  Control/stop worrying 2  Worry too much - different things 2  Trouble relaxing 1  Restless 1  Easily annoyed or irritable 2  Afraid - awful might happen 1  Total GAD 7 Score 11  Anxiety Difficulty Very difficult     Seizure d/o - needs to see a neurologist. She stopped keppra due to mood swings; hasn't taken since February. Last sleep deprived EEG was normal: 02/2016. This is secondary to meningioma. She requests [facility], Dr. [name]. She has been to [place] in the distant past.  Migraines on topamax but says are not well controlled. Having 2-3 / month. Unilateral with  n/v. Has imitrex for rescue. Triggers: stress and lack of sleep.  GERD is well controlled with diet. Zantac prn. HM is up to date with last CPE in April but due for mammogram and flu shot. I've personally reviewed recent office visit notes, hospital notes, associated labs and imaging reports and/or pertinent outside office records via chart review or CareEverywhere. She is now working [employer] and reports a neg TB test and td shot then. She will get the records. ROS: + for LBP on right. H/o OA and sciatica. Thinks it is related to "cyst" on right low back - uses voltaren gel prn which helps.   We updated and reviewed the patient's past history in detail and it is documented below.  Patient Active Problem List   Diagnosis Date Noted  . Chronic right-sided low back pain without sciatica 02/12/2017  . Lipoma of torso 02/12/2017  . Meningioma ([place]) 07/07/2016  . Major depression, recurrent, chronic ([place]) 06/23/2016  . Soft tissue mass 06/23/2016  . 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 Migraine without status migrainosus, not intractable 10/01/2015  . Helicobacter pylori (H. pylori) infection 08/17/2015  . Gastroesophageal reflux disease with esophagitis 08/13/2015  . Pterygium 08/23/2009    Health Maintenance  Topic Date Due  . Hepatitis C Screening  Mar 21, 1965  . HIV Screening  01/10/1979  . MAMMOGRAM  03/24/2016  .  PAP SMEAR  06/22/2020  . TETANUS/TDAP  02/09/2021  . COLONOSCOPY  04/29/2025  . INFLUENZA VACCINE  Completed   Immunization History  Administered Date(s) Administered  . Influenza Split 11/29/2007, 01/22/2009, 12/31/2009, 12/23/2010  . Influenza,inj,Quad PF,6+ Mos 12/31/2014, 02/12/2017  . Influenza,inj,quad, With Preservative 01/21/2016  . Tdap 02/10/2011   Current Meds  Medication Sig  . acetaminophen (TYLENOL) 325 MG tablet Take by mouth.  . diclofenac sodium (VOLTAREN) 1 % GEL APPLY 2G THREE TIMES DAILY AS NEEDED FOR PAIN.  . ranitidine (ZANTAC) 150 MG  capsule Take 150 mg by mouth as needed.   . sertraline (ZOLOFT) 50 MG tablet Take 3 tablets (150 mg total) by mouth at bedtime.  . SUMAtriptan (IMITREX) 100 MG tablet Take 1 tab by mouth at first sign of migraine-May repeat in 2 hrs-MAX 2 tabs/24 hrs  . topiramate (TOPAMAX) 50 MG tablet Take 1 tablet (50 mg total) by mouth 2 (two) times daily.  . [DISCONTINUED] sertraline (ZOLOFT) 50 MG tablet Take 75 mg by mouth daily.   . [DISCONTINUED] SUMAtriptan (IMITREX) 100 MG tablet Take 1 tab by mouth at first sign of migraine-May repeat in 2 hrs-MAX 2 tabs/24 hrs  . [DISCONTINUED] topiramate (TOPAMAX) 50 MG tablet TAKE 1 TABLET BY MOUTH EVERY 12 HOURS WILL NEED LOCAL PROVIDER FOR FURTHER REFILLS    Allergies: Patient has No Known Allergies. Past Medical History Patient  has a past medical history of Anxiety, Brain tumor ([place]) (2013), Depression, Epilepsy ([place]), GERD (gastroesophageal reflux disease), Meningitis (2013), Migraines, and Seizures ([place]). Past Surgical History Patient  has a past surgical history that includes Cesarean section; Brain surgery (2014); and CT RADIATION THERAPY GUIDE (2014). Family History: Patient family history includes Anxiety disorder in her daughter and son; Hearing loss in her son; Hyperlipidemia in her father; Hypertension in her father and mother; Other in her daughter. Social History:  Patient  reports that  has never smoked. she has never used smokeless tobacco. She reports that she does not drink alcohol or use drugs.  Review of Systems: Constitutional: negative for fever or malaise Ophthalmic: negative for photophobia, double vision or loss of vision Cardiovascular: negative for chest pain, dyspnea on exertion, or new LE swelling Respiratory: negative for SOB or persistent cough Gastrointestinal: negative for abdominal pain, change in bowel habits or melena Genitourinary: negative for dysuria or gross hematuria Musculoskeletal: negative for new gait disturbance  or muscular weakness Integumentary: negative for new or persistent rashes Neurological: negative for TIA or stroke symptoms Psychiatric: negative for SI or delusions, positive for worry Allergic/Immunologic: negative for hives  Patient Care Team    Relationship Specialty Notifications Start End  Leamon Arnt, MD PCP - General Family Medicine  02/12/17     Objective  Vitals: BP 92/60 (BP Location: Left Arm, Patient Position: Sitting, Cuff Size: Normal)   Pulse 86   Temp 98.2 F (36.8 C) (Oral)   Ht 5' 2.5" (1.588 m)   Wt 135 lb 8 oz (61.5 kg)   LMP 07/12/2014 (LMP Unknown)   SpO2 96%   BMI 24.39 kg/m  General:  Well developed, well nourished, no acute distress  Psych:  Alert and oriented,normal mood and affect HEENT:  Normocephalic, atraumatic, non-icteric sclera, PERRL, oropharynx is without mass or exudate, supple neck without adenopathy, mass or thyromegaly Cardiovascular:  RRR without gallop, rub or murmur, nondisplaced PMI Respiratory:  Good breath sounds bilaterally, CTAB with normal respiratory effort Gastrointestinal: normal bowel sounds, soft, non-tender, no noted masses. No HSM MSK: no  deformities, contusions. Joints are without erythema or swelling, neg sLR bilaterall. Skin:  Warm, no rashes or suspicious lesions noted, right low back with 3cm mobile subdermal mass c/w lipoma, nontender Neurologic:    Mental status is normal. Gross motor and sensory exams are normal. Normal gait  Assessment  1. Major depression, recurrent, chronic ([place])   2. Meningioma ([place])   3. 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 Chronic right-sided low back pain without sciatica   5. Lipoma of torso   6. Need for prophylactic vaccination and inoculation against influenza      Plan    Depression with anxiety:  Recommend increasing zoloft to 150mg  nightly (reports it helps her sleep). Reevaluate in 4-6 weeks. Add doxepin at night if sleep still a problem. See avs  Seizure d/o, meningioma and  migraines: refilled meds and refer to neuro for management recommendations.   LBP likely msk; doubt related to lipoma/cyst. Educated. Tylenol as needed.   Follow up:  Return in about 6 weeks (around 03/26/2017) for follow up on your mood.  Commons side effects, risks, benefits, and alternatives for medications and treatment plan prescribed today were discussed, and the patient expressed understanding of the given instructions. Patient is instructed to call or message via MyChart if he/she has any questions or concerns regarding our treatment plan. No barriers to understanding were identified. We discussed Red Flag symptoms and signs in detail. Patient expressed understanding regarding what to do in case of urgent or emergency type symptoms.   Medication list was reconciled, printed and provided to the patient in AVS. Patient instructions and summary information was reviewed with the patient as documented in the AVS. This note was prepared with assistance of Dragon voice recognition software. Occasional wrong-word or sound-a-like substitutions may have occurred due to the inherent limitations of voice recognition software  Orders Placed This Encounter  Procedures  . Flu Vaccine QUAD 36+ mos IM  . Ambulatory referral to Neurology   Meds ordered this encounter  Medications  . sertraline (ZOLOFT) 50 MG tablet    Sig: Take 3 tablets (150 mg total) by mouth at bedtime.    Dispense:  180 tablet    Refill:  5  . SUMAtriptan (IMITREX) 100 MG tablet    Sig: Take 1 tab by mouth at first sign of migraine-May repeat in 2 hrs-MAX 2 tabs/24 hrs    Dispense:  10 tablet    Refill:  1  . topiramate (TOPAMAX) 50 MG tablet    Sig: Take 1 tablet (50 mg total) by mouth 2 (two) times daily.    Dispense:  60 tablet    Refill:  1

## 2017-02-12 NOTE — Patient Instructions (Signed)
Please return in 4-6 weeks to recheck your mood and medication.   It was a pleasure meeting you! Thank you for choosing [place] to meet your healthcare needs! I truly look forward to working with you.  When you're feeling too down to do anything, try these 10 Little things!  Take a shower. Even if you plan to stay in all day long and not see a soul, take a shower. It takes the most effort to hop in to the shower but once you do, you'll feel immediate results. It will wake you up and you'll be feeling much fresher (and cleaner too).  Brush and floss your teeth. Give your teeth a good brushing with a floss finish. It's a small task but it feels so good and you can check 'taking care of your health' off the list of things to do.  Do something small on your list. Most of [place] have some small thing we would like to get done (load of laundry, sew a button, email a friend). Doing one of these things will make you feel like you've accomplished something.  Drink water. Drinking water is easy right? It's also really beneficial for your health so keep a glass beside you all day and take sips often. It gives you energy and prevents you from boredom eating.  Do some floor exercises. The last thing you want to do is exercise but it might be just the thing you need the most. Keep it simple and do exercises that involve sitting or laying on the floor. Even the smallest of exercises release chemicals in the brain that make you feel good. Yoga stretches or core exercises are going to make you feel good with minimal effort.  Make your bed. Making your bed takes a few minutes but it's productive and you'll feel relieved when it's done. An unmade bed is a huge visual reminder that you're having an unproductive day. Do it and consider it your housework for the day.  Put on some nice clothes. Take the sweatpants off even if you don't plan to go anywhere. Put on clothes that make you feel good. Take a look in the mirror  so your brain recognizes the sweatpants have been replaced with clothes that make you look great. It's an instant confidence booster.  Wash the dishes. A pile of dirty dishes in the sink is a reflection of your mood. It's possible that if you wash up the dishes, your mood will follow suit. It's worth a try.  Cook a real meal. If you have the luxury to have a "do nothing" day, you have time to make a real meal for yourself. Make a meal that you love to eat. The process is good to get you out of the funk and the food will ensure you have more energy for tomorrow.  Write out your thoughts by hand. When you hand write, you stimulate your brain to focus on the moment that you're in so make yourself comfortable and write whatever comes into your mind. Put those thoughts out on paper so they stop spinning around in your head. Those thoughts might be the very thing holding you down.

## 2017-02-22 ENCOUNTER — Encounter: Payer: Self-pay | Admitting: Neurology

## 2017-03-14 ENCOUNTER — Encounter: Payer: Self-pay | Admitting: Family Medicine

## 2017-03-30 ENCOUNTER — Ambulatory Visit: Payer: Self-pay | Admitting: Family Medicine

## 2017-04-02 ENCOUNTER — Ambulatory Visit: Payer: Self-pay | Admitting: Family Medicine

## 2017-04-02 ENCOUNTER — Encounter: Payer: Self-pay | Admitting: Family Medicine

## 2017-04-02 VITALS — BP 110/62 | HR 93 | Temp 98.2°F | Ht 63.0 in | Wt 141.0 lb

## 2017-04-02 DIAGNOSIS — M545 Low back pain, unspecified: Secondary | ICD-10-CM

## 2017-04-02 DIAGNOSIS — G8929 Other chronic pain: Secondary | ICD-10-CM

## 2017-04-02 DIAGNOSIS — F339 Major depressive disorder, recurrent, unspecified: Secondary | ICD-10-CM

## 2017-04-02 DIAGNOSIS — G40109 Localization-related (focal) (partial) symptomatic epilepsy and epileptic syndromes with simple partial seizures, not intractable, without status epilepticus: Secondary | ICD-10-CM

## 2017-04-02 MED ORDER — TOPIRAMATE 50 MG PO TABS: 50.0000 mg | ORAL_TABLET | Freq: Two times a day (BID) | ORAL | 1 refills | Status: DC

## 2017-04-02 NOTE — Progress Notes (Signed)
Subjective  CC:  Chief Complaint  Patient presents with  . Depression    HPI: Victoria Bishop is a 52 y.o. female who presents to the office today to address the problems listed above in the chief complaint, mood problems.  Last visit 6 weeks ago we increased zoloft dose to help with depression. She also is now living with family. She is MUCH better. See phq-9 below. No AEs from zoloft. Sleep is better. C/o of occ headache. Has NP appt with Dr. [name] in march for meningioma and headaches and seizure disorder. No significant seizures since our last visit.  She denies current suicidal or homicidal plan or intent. Feeling much better now that she is no longer alone and has familial support.  Depression screen [facility] 2/9 04/02/2017 02/12/2017  Decreased Interest 0 2  Down, Depressed, Hopeless 0 2  PHQ - 2 Score 0 4  Altered sleeping 1 2  Tired, decreased energy 0 1  Change in appetite 0 1  Feeling bad or failure about yourself  0 0  Trouble concentrating 0 1  Moving slowly or fidgety/restless 1 3  Suicidal thoughts 0 1  PHQ-9 Score 2 13  Difficult doing work/chores Not difficult at all Very difficult    Back pain is much bette as well.   I reviewed the patients updated PMH, FH, and SocHx.    Patient Active Problem List   Diagnosis Date Noted  . Chronic right-sided low back pain without sciatica 02/12/2017  . Lipoma of torso 02/12/2017  . Meningioma ([place]) 07/07/2016  . Major depression, recurrent, chronic ([place]) 06/23/2016  . Soft tissue mass 06/23/2016  . 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 Migraine without status migrainosus, not intractable 10/01/2015  . Helicobacter pylori (H. pylori) infection 08/17/2015  . Gastroesophageal reflux disease with esophagitis 08/13/2015  . Pterygium 08/23/2009   Current Meds  Medication Sig  . acetaminophen (TYLENOL) 325 MG tablet Take by mouth.  . diclofenac sodium (VOLTAREN) 1 % GEL APPLY 2G THREE TIMES DAILY AS NEEDED FOR PAIN.  .  ranitidine (ZANTAC) 150 MG capsule Take 150 mg by mouth as needed.   . sertraline (ZOLOFT) 50 MG tablet Take 3 tablets (150 mg total) by mouth at bedtime.  . SUMAtriptan (IMITREX) 100 MG tablet Take 1 tab by mouth at first sign of migraine-May repeat in 2 hrs-MAX 2 tabs/24 hrs  . topiramate (TOPAMAX) 50 MG tablet Take 1 tablet (50 mg total) by mouth 2 (two) times daily.  . [DISCONTINUED] topiramate (TOPAMAX) 50 MG tablet Take 1 tablet (50 mg total) by mouth 2 (two) times daily.    Allergies: Patient has No Known Allergies. Family history:  Patient family history includes Anxiety disorder in her daughter and son; Hearing loss in her son; Hyperlipidemia in her father; Hypertension in her father and mother; Other in her daughter. Social History   Socioeconomic History  . Marital status: Married    Spouse name: None  . Number of children: None  . Years of education: None  . Highest education level: None  Social Needs  . Financial resource strain: None  . Food insecurity - worry: None  . Food insecurity - inability: None  . Transportation needs - medical: None  . Transportation needs - non-medical: None  Occupational History  . Occupation: food Metallurgist: [employer]  Tobacco Use  . Smoking status: Never Smoker  . Smokeless tobacco: Never Used  Substance and Sexual Activity  . Alcohol use: No  .  Drug use: No  . Sexual activity: Yes    Birth control/protection: None  Other Topics Concern  . None  Social History Narrative   Originally from [place], non smoker and non drinker, lives with husband and a son; 2 children (daughter and son), daughter passed at age 37      Review of Systems: Constitutional: Negative for fever malaise or anorexia Cardiovascular: negative for chest pain Respiratory: negative for SOB or persistent cough Gastrointestinal: negative for abdominal pain  Objective  Vitals: BP 110/62 (BP Location: Left Arm, Patient Position: Sitting,  Cuff Size: Normal)   Pulse 93   Temp 98.2 F (36.8 C) (Oral)   Ht 5\' 3"  (1.6 m)   Wt 141 lb (64 kg)   LMP 07/12/2014 (LMP Unknown)   SpO2 96%   BMI 24.98 kg/m  General: no acute distress, well appearing, no apparent distress, well groomed Psych:  Alert and oriented x 3,normal mood, behavior, speech, dress, and thought processes. normal affect and smiling and more interactive Cardiovascular:  RRR without murmur or gallop. no peripheral edema Respiratory:  Good breath sounds bilaterally, CTAB with normal respiratory effort Skin:  Warm, no rashes   Assessment  1. Major depression, recurrent, chronic (HCC)   2. 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 Chronic right-sided low back pain without sciatica      Plan   Depression:  Much improved. Continue current dose of zoloft x 6-12 months.  F/u with neuro for seizure d/o, headaches, and meningioma.   Back pain is improved. Continue supportive care.  Reviewed concept of mood problems caused by biochemical imbalance of neurotransmitters and rationale for treatment with medications and therapy.   Counseling given: pt was instructed to contact office, on-call physician or crisis Hotline if symptoms worsen significantly. If patient develops any suicidal or homicidal thoughts, she is directed to the ER immediately.   Follow up: defers cpe until gets insurance, hopefully in next 4-6 months    Commons side effects, risks, benefits, and alternatives for medications and treatment plan prescribed today were discussed, and the patient expressed understanding of the given instructions. Patient is instructed to call or message via MyChart if he/she has any questions or concerns regarding our treatment plan. No barriers to understanding were identified. We discussed Red Flag symptoms and signs in detail. Patient expressed understanding regarding what to do in case of urgent or emergency type symptoms.   Medication list was reconciled, printed and provided to  the patient in AVS. Patient instructions and summary information was reviewed with the patient as documented in the AVS. This note was prepared with assistance of Dragon voice recognition software. Occasional wrong-word or sound-a-like substitutions may have occurred due to the inherent limitations of voice recognition software  No orders of the defined types were placed in this encounter.  Meds ordered this encounter  Medications  . topiramate (TOPAMAX) 50 MG tablet    Sig: Take 1 tablet (50 mg total) by mouth 2 (two) times daily.    Dispense:  60 tablet    Refill:  1

## 2017-04-02 NOTE — Patient Instructions (Signed)
Please return in 4-6 months for your complete physical, come fasting.  If you have any questions or concerns, please don't hesitate to send me a message via MyChart or call the office at [phone]. Thank you for visiting with [place] today! It's our pleasure caring for you.  Keep your appointment with the neurologist.

## 2017-04-27 MED FILL — SERTRALINE HCL 50 MG TABLET: 50 | 60 days supply | Qty: 180 | Fill #1

## 2017-05-04 MED FILL — TOPIRAMATE 50 MG TABLET: 50 | 30 days supply | Qty: 60 | Fill #0

## 2017-05-11 ENCOUNTER — Ambulatory Visit (INDEPENDENT_AMBULATORY_CARE_PROVIDER_SITE_OTHER): Payer: Self-pay | Admitting: Neurology

## 2017-05-11 ENCOUNTER — Other Ambulatory Visit: Payer: Self-pay

## 2017-05-11 ENCOUNTER — Encounter: Payer: Self-pay | Admitting: Neurology

## 2017-05-11 VITALS — BP 120/66 | HR 70 | Ht 62.0 in | Wt 140.0 lb

## 2017-05-11 DIAGNOSIS — G43009 Migraine without aura, not intractable, without status migrainosus: Secondary | ICD-10-CM

## 2017-05-11 DIAGNOSIS — G40209 Localization-related (focal) (partial) symptomatic epilepsy and epileptic syndromes with complex partial seizures, not intractable, without status epilepticus: Secondary | ICD-10-CM

## 2017-05-11 MED ORDER — DIVALPROEX SODIUM ER 250 MG PO TB24: ORAL_TABLET | ORAL | 6 refills | Status: DC

## 2017-05-11 MED ORDER — SUMATRIPTAN SUCCINATE 100 MG PO TABS: ORAL_TABLET | ORAL | 5 refills | Status: DC

## 2017-05-11 MED ORDER — TOPIRAMATE 50 MG PO TABS: 50.0000 mg | ORAL_TABLET | Freq: Two times a day (BID) | ORAL | 6 refills | Status: DC

## 2017-05-11 MED FILL — SUMATRIPTAN SUCC 100 MG TAB: 100 | 30 days supply | Qty: 10 | Fill #0

## 2017-05-11 MED FILL — DIVALPROEX SOD ER 250 MG TA: 250 | 30 days supply | Qty: 30 | Fill #0

## 2017-05-11 NOTE — Patient Instructions (Signed)
1. Start Depakote ER 250mg : Take 1 tablet every night 2. Continue Topamax 50mg  twice a day 3. Take sumatriptan as needed for migraine. Do not take more than 2-3 a week 4. Keep a calendar of your seizures 5. Follow-up in 3 months, call for any changes  Seizure Precautions: 1. If medication has been prescribed for you to prevent seizures, take it exactly as directed.  Do not stop taking the medicine without talking to your doctor first, even if you have not had a seizure in a long time.   2. Avoid activities in which a seizure would cause danger to yourself or to others.  Don't operate dangerous machinery, swim alone, or climb in high or dangerous places, such as on ladders, roofs, or girders.  Do not drive unless your doctor says you may.  3. If you have any warning that you may have a seizure, lay down in a safe place where you can't hurt yourself.    4.  No driving for 6 months from last seizure, as per [facility].   Please refer to the following link on the [place] website for more information: http://www.epilepsyfoundation.org/answerplace/Social/driving/drivingu.cfm   5.  Maintain good sleep hygiene. Avoid alcohol.  6.  Contact your doctor if you have any problems that may be related to the medicine you are taking.  7.  Call 911 and bring the patient back to the ED if:        A.  The seizure lasts longer than 5 minutes.       B.  The patient doesn't awaken shortly after the seizure  C.  The patient has new problems such as difficulty seeing, speaking or moving  D.  The patient was injured during the seizure  E.  The patient has a temperature over 102 F (39C)  F.  The patient vomited and now is having trouble breathing

## 2017-05-11 NOTE — Progress Notes (Signed)
NEUROLOGY CONSULTATION NOTE  Victoria Bishop MRN: [id number] DOB: [birth year]  Referring provider: Dr. Billey Chang Primary care provider: Dr. Billey Chang  Reason for consult:  Establish care for epilepsy  Dear Dr Jonni Sanger:  Thank you for your kind referral of Victoria Bishop for consultation of the above symptoms. Although her history is well known to you, please allow me to reiterate it for the purpose of our medical record. The patient was accompanied to the clinic by her daughter who also provides collateral information and helps with Spanish translation. The patient could speak and understand English but needed some help at times. Records and images were personally reviewed where available.  HISTORY OF PRESENT ILLNESS: This is a 52 year old right-handed woman presenting for migraines, meningioma resection, and seizures, presenting to establish care. She recently moved from [place], records from her Neurologist in [place], [place] were reviewed. She started having seizures in July 2000 while still living in [place]. At that time she had eclampsia during delivery, she had pain radiating to her head then lost awareness. She was admitted to the hospital for a month and had renal failure requiring dialysis. Per report, 3 days later, she had seizures. There is an MRI brain report from July 2000 was concerning for PRES, reporting abnormal foci of signal intensity involving the cortical and subcortical regions of the posterior parietal and occipital lobes in a fairly symmetrical fashion. The largest area of abnormal signal involves the left posterior parietal lobe. Initially she was having episodes of loss of consciousness with convulsions. She was initially started on Tegretol and Dilantin immediately post-partum, and had been very well controlled on carbamazepine XR for several years. There is an EEG report from 11/2005 quoted from [facility] reporting "epileptiform activity generalized, although there was not  significant associated clinical motor activity." She had an EEG in [place] in 04/2006 reporting occasional sharp and slow wave complexes that appear epileptiform over the left anterior temporal regions. They deny any convulsions since 2000. Since then, she has been having recurrent stereotyped episodes starting with left-sided numbness and tingling on the arm, leg, face, and left side of her tongue. The left side of her head "feels like it is going weak." She describes electric shocks. There is no jerking or twitching but she describes them as spasms, then she feels weaker on the left side after. She cannot concentrate when this occurs, she speaks but slows down a lot, keeps saying "uhm" for several minutes. Apparently she can still function and do things when this occurs. She is sleepy afterwards and wakes up feeling better, but her daughter notices it takes a couple of days to return to her normal baseline. There is no associated headaches. She has difficulty saying how often they are, sometimes occurring twice a week, other times every other week. Last episode was 1.5 weeks ago. When she has them at night, she has really bad dreams. Her husband thinks she is having nightmares but when she wakes up she feels different. She denies any olfactory/gustatory hallucinations, rising epigastric sensation. She has noticed some gaps in time, her daughter has to repeat things some times.   Records from [place] were reviewed. She was transitioned off carbamazepine to zonisamide in 2010 due to concerns for osteoporosis, however had abdominal pain and stomach burning, and was switched to Levetiracetam. 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 She reports that headaches worsened after the surgery and was started on Topiramate in 2015. She  feels this has helped with the headaches but not the seizures. She is taking Topiramate 50mg  BID. Higher doses in the past caused side effects. She reports stopping the Keppra last February 2018 because her mood swings were horrible and anxiety was heightened. This has increased seizures frequency, but the anxiety and mood swings are better. Her Zoloft dose was also increased. She takes sumatriptan around twice a week on average for the migraines. Sleep is good. She denies any dizziness, diplopia, dysarthria/dysphagia, neck/back pain, bowel/bladder dysfunction. They have noticed she has a cough that would not stop if she has mint or a drink. She  Diagnostic Data: 03/20/2016 EEG normal wake and sleep EEG MRI brain 09/02/2015: Stable to slightly decreased size of rounded, well-circumscribed, homogenously  enhancing, extradural right cerebellar hemisphere lesion, again most consistent with a meningioma; this measures approximately 1.2 x 1.2 x 1.3 cm (AP x ML x CC) today. No new lesions suspicious for meningioma are seen. 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 She had meningioma resection in 2013. Otherwise she had a normal birth and early development.  There is no history of febrile convulsions, CNS infections such as meningitis/encephalitis, significant traumatic brain injury, or family history of seizures.  Prior AEDs: Tegretol, Dilantin, Keppra, Zonisamide  PAST MEDICAL HISTORY: Past Medical History:  Diagnosis Date  . Anxiety   . Brain tumor ([place]) 2013  . Depression   . Epilepsy ([place])   . GERD (gastroesophageal reflux disease)   . Meningitis 2013  . Migraines   . Seizures ([place])     PAST SURGICAL HISTORY: Past Surgical History:    Procedure Laterality Date  . BRAIN SURGERY  2014  . CESAREAN SECTION    . CT RADIATION THERAPY GUIDE  2014    MEDICATIONS: Current Outpatient Medications on File Prior to Visit  Medication Sig Dispense Refill  . acetaminophen (TYLENOL) 325 MG tablet Take by mouth.    . diclofenac sodium (VOLTAREN) 1 % GEL APPLY 2G THREE TIMES DAILY AS NEEDED FOR PAIN.    . ranitidine (ZANTAC) 150 MG capsule Take 150 mg by mouth as needed.     . sertraline (ZOLOFT) 50 MG tablet Take 3 tablets (150 mg total) by mouth at bedtime. 180 tablet 5  . SUMAtriptan (IMITREX) 100 MG tablet Take 1 tab by mouth at first sign of migraine-May repeat in 2 hrs-MAX 2 tabs/24 hrs 10 tablet 1  . topiramate (TOPAMAX) 50 MG tablet Take 1 tablet (50 mg total) by mouth 2 (two) times daily. 60 tablet 1   No current facility-administered medications on file prior to visit.     ALLERGIES: No Known Allergies  FAMILY HISTORY: Family History  Problem Relation Age of Onset  . Hypertension Mother   . Hypertension Father   . Hyperlipidemia Father   . Anxiety disorder Daughter   . Hearing loss Son   . Anxiety disorder Son   . Other Daughter        vader syndrome  . Diabetes Neg Hx   . Cancer Neg Hx     SOCIAL HISTORY: Social History   Socioeconomic History  .  Marital status: Married    Spouse name: Not on file  . Number of children: Not on file  . Years of education: Not on file  . Highest education level: Not on file  Social Needs  . Financial resource strain: Not on file  . Food insecurity - worry: Not on file  . Food insecurity - inability: Not on file  . Transportation needs - medical: Not on file  . Transportation needs - non-medical: Not on file  Occupational History  . Occupation: food Metallurgist: [employer]  Tobacco Use  . Smoking status: Never Smoker  . Smokeless tobacco: Never Used  Substance and Sexual Activity  . Alcohol use: No  . Drug use: No  . Sexual activity: Yes     Birth control/protection: None  Other Topics Concern  . Not on file  Social History Narrative   Originally from [place], non smoker and non drinker, lives with husband and a son; 2 children (daughter and son), daughter passed at age 9     REVIEW OF SYSTEMS: Constitutional: No fevers, chills, or sweats, no generalized fatigue, change in appetite Eyes: No visual changes, double vision, eye pain Ear, nose and throat: No hearing loss, ear pain, nasal congestion, sore throat Cardiovascular: No chest pain, palpitations Respiratory:  No shortness of breath at rest or with exertion, wheezes GastrointestinaI: No nausea, vomiting, diarrhea, abdominal pain, fecal incontinence Genitourinary:  No dysuria, urinary retention or frequency Musculoskeletal:  No neck pain, back pain Integumentary: No rash, pruritus, skin lesions Neurological: as above Psychiatric: No depression, insomnia, anxiety Endocrine: No palpitations, fatigue, diaphoresis, mood swings, change in appetite, change in weight, increased thirst Hematologic/Lymphatic:  No anemia, purpura, petechiae. Allergic/Immunologic: no itchy/runny eyes, nasal congestion, recent allergic reactions, rashes  PHYSICAL EXAM: Vitals:   05/11/17 1403  BP: 120/66  Pulse: 70  SpO2: 97%   General: No acute distress Head:  Normocephalic/atraumatic Eyes: Fundoscopic exam shows bilateral sharp discs, no vessel changes, exudates, or hemorrhages Neck: supple, no paraspinal tenderness, full range of motion Back: No paraspinal tenderness Heart: regular rate and rhythm Lungs: Clear to auscultation bilaterally. Vascular: No carotid bruits. Skin/Extremities: No rash, no edema Neurological Exam: Mental status: alert and oriented to person, place, and time, no dysarthria or aphasia, Fund of knowledge is appropriate.  Recent and remote memory are intact.  Attention and concentration are normal.    Able to name objects and repeat phrases. Cranial nerves: CN I:  not tested CN II: pupils equal, round and reactive to light, visual fields intact, fundi unremarkable. CN III, IV, VI:  full range of motion, no nystagmus, no ptosis CN V: facial sensation intact CN VII: upper and lower face symmetric CN VIII: hearing intact to finger rub CN IX, X: gag intact, uvula midline CN XI: sternocleidomastoid and trapezius muscles intact CN XII: tongue midline Bulk & Tone: normal, no fasciculations. Motor: 5/5 throughout with no pronator drift. Sensation: intact to light touch, cold, pin, vibration and joint position sense.  No extinction to double simultaneous stimulation.  Romberg test negative Deep Tendon Reflexes: asymmetric reflexes brisk +2 on left UE, +1 right UE, +2 both LE, negative Hoffman sign, no ankle clonus Plantar responses: downgoing bilaterally Cerebellar: no incoordination on finger to nose, heel to shin. No dysdiadochokinesia Gait: narrow-based and steady, able to tandem walk adequately. Tremor: none  IMPRESSION: This is a 52 year old right-handed woman with a history of migraines, meningioma resection, and seizures suggestive of focal seizures without impairment  of awareness, possibly arising from the right hemisphere, describing left-sided numbness and post-ictal weakness on the left side. However,a prior EEG reported left anterior temporal epileptiform discharges. 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 She has also had headaches after meningioma surgery. She has been sensitive to various medications, she stopped Keppra and is on Topamaax 50mg  BID. Higher doses caused side effects. We discussed adding on low dose Depakote ER 250mg  qhs for seizure and headache prophylaxis, this may help with mood as well. Side effects were discussed. We may uptitrate as tolerated. She takes prn sumatriptan for migraines and knows to minimize to 2-3 a week to avoid rebound headaches. [place] driving laws were discussed  with the patient, and she knows to stop driving after a seizure with loss of awareness, until 6 months seizure-free. She was advised to keep a calendar of her seizures and follow-up in 3 months. She knows to call for any changes.   Thank you for allowing me to participate in the care of this patient. Please do not hesitate to call for any questions or concerns.   Ellouise Newer, M.D.  CC: Dr. Jonni Sanger

## 2017-05-27 ENCOUNTER — Encounter: Payer: Self-pay | Admitting: Neurology

## 2017-06-01 ENCOUNTER — Telehealth: Payer: Self-pay | Admitting: Neurology

## 2017-06-01 ENCOUNTER — Other Ambulatory Visit: Payer: Self-pay

## 2017-06-01 DIAGNOSIS — G40209 Localization-related (focal) (partial) symptomatic epilepsy and epileptic syndromes with complex partial seizures, not intractable, without status epilepticus: Secondary | ICD-10-CM

## 2017-06-01 DIAGNOSIS — G43009 Migraine without aura, not intractable, without status migrainosus: Secondary | ICD-10-CM

## 2017-06-01 MED ORDER — TOPIRAMATE 50 MG PO TABS: 100.0000 mg | ORAL_TABLET | Freq: Two times a day (BID) | ORAL | 6 refills | Status: DC

## 2017-06-01 NOTE — Telephone Encounter (Signed)
Rx sent to pharmacy  topamax 50mg , #120, 6 refills. Sig - take 2 tablets twice daily.

## 2017-06-01 NOTE — Telephone Encounter (Signed)
If patient is at the pharmacy, call can be transferred to refill team.  1.     Which medications need to be refilled? (please list name of each medication and dose if know) Topamax  2.     Which pharmacy/location (including street and city if local pharmacy) is medication to be sent to? [facility]  3.     Do they need a 30 or 90 day supply? Needing new dosage sen to pharmacy

## 2017-06-06 MED FILL — TOPIRAMATE 50 MG TABLET: 50 | 30 days supply | Qty: 120 | Fill #0

## 2017-07-05 MED FILL — SERTRALINE HCL 50 MG TABLET: 50 | 60 days supply | Qty: 180 | Fill #2

## 2017-07-18 MED FILL — TOPIRAMATE 50 MG TABLET: 50 | 30 days supply | Qty: 120 | Fill #1

## 2017-08-15 ENCOUNTER — Ambulatory Visit: Payer: Self-pay | Admitting: Neurology

## 2017-08-20 ENCOUNTER — Encounter: Payer: Self-pay | Admitting: Neurology

## 2017-08-24 ENCOUNTER — Ambulatory Visit (INDEPENDENT_AMBULATORY_CARE_PROVIDER_SITE_OTHER): Payer: Self-pay | Admitting: Neurology

## 2017-08-24 ENCOUNTER — Other Ambulatory Visit: Payer: Self-pay

## 2017-08-24 ENCOUNTER — Encounter: Payer: Self-pay | Admitting: Neurology

## 2017-08-24 DIAGNOSIS — G43009 Migraine without aura, not intractable, without status migrainosus: Secondary | ICD-10-CM

## 2017-08-24 DIAGNOSIS — G40209 Localization-related (focal) (partial) symptomatic epilepsy and epileptic syndromes with complex partial seizures, not intractable, without status epilepticus: Secondary | ICD-10-CM

## 2017-08-24 MED ORDER — TOPIRAMATE 50 MG PO TABS: 100.0000 mg | ORAL_TABLET | Freq: Two times a day (BID) | ORAL | 6 refills | Status: DC

## 2017-08-24 MED FILL — TOPIRAMATE 50 MG TABLET: 50 | 30 days supply | Qty: 120 | Fill #0

## 2017-08-24 NOTE — Patient Instructions (Signed)
1. Continue Topamax 50mg : Take 2 tablets twice a day 2. Keep a calendar of your headaches and seizures. If more than 4 times a month, would add on low dose gabapentin. Call our office for an update in 3 months 3. Follow-up in 6 months, call for any changes  Seizure Precautions: 1. If medication has been prescribed for you to prevent seizures, take it exactly as directed.  Do not stop taking the medicine without talking to your doctor first, even if you have not had a seizure in a long time.   2. Avoid activities in which a seizure would cause danger to yourself or to others.  Don't operate dangerous machinery, swim alone, or climb in high or dangerous places, such as on ladders, roofs, or girders.  Do not drive unless your doctor says you may.  3. If you have any warning that you may have a seizure, lay down in a safe place where you can't hurt yourself.    4.  No driving for 6 months from last seizure, as per [facility].   Please refer to the following link on the [place] website for more information: http://www.epilepsyfoundation.org/answerplace/Social/driving/drivingu.cfm   5.  Maintain good sleep hygiene. Avoid alcohol.  6.  Contact your doctor if you have any problems that may be related to the medicine you are taking.  7.  Call 911 and bring the patient back to the ED if:        A.  The seizure lasts longer than 5 minutes.       B.  The patient doesn't awaken shortly after the seizure  C.  The patient has new problems such as difficulty seeing, speaking or moving  D.  The patient was injured during the seizure  E.  The patient has a temperature over 102 F (39C)  F.  The patient vomited and now is having trouble breathing

## 2017-08-24 NOTE — Progress Notes (Signed)
NEUROLOGY FOLLOW UP OFFICE NOTE  ROUX BRANDY [id number] 08-Aug-1965  HISTORY OF PRESENT ILLNESS: I had the pleasure of seeing Victoria Bishop in follow-up in the neurology clinic on 08/24/2017.  The patient was last seen 3 months ago for recurrent seizures and migraines. She is alone in the office today. On her last visit, she reported continued seizures and migraines on Topamax 50mg  BID. Depakote was added, but she stopped it after 2 weeks due to fatigue, inability to function at work, drowsiness. Topamax dose was increased to 100mg  BID. She reports the episodes of left-sided symptoms are better. She feels the headaches are also better, sometimes she can go with only 2 headaches a month, sometimes more frequent. Las week she had 2 migraines in a week, took Ibuprofen, then Imitrex, which helped. She sometimes wakes up with her head feeling heavy and dizzy. She has not been able to have follow-up MRI for meningioma due to insurance issues. She denies any vision changes, she has occasional tingling in both hands, no falls.   HPI 05/11/2017: This is a 52 yo RH woman presenting for migraines, meningioma resection, and seizures. She recently moved from [place], records from her Neurologist in [place], [place] were reviewed. She started having seizures in July 2000 while still living in [place]. At that time she had eclampsia during delivery, she had pain radiating to her head then lost awareness. She was admitted to the hospital for a month and had renal failure requiring dialysis. Per report, 3 days later, she had seizures. There is an MRI brain report from July 2000 was concerning for PRES, reporting abnormal foci of signal intensity involving the cortical and subcortical regions of the posterior parietal and occipital lobes in a fairly symmetrical fashion. The largest area of abnormal signal involves the left posterior parietal lobe. Initially she was having episodes of loss of consciousness with convulsions.  She was initially started on Tegretol and Dilantin immediately post-partum, and had been very well controlled on carbamazepine XR for several years. There is an EEG report from 11/2005 quoted from [place] reporting "epileptiform activity generalized, although there was not significant associated clinical motor activity." She had an EEG in [place] in 04/2006 reporting occasional sharp and slow wave complexes that appear epileptiform over the left anterior temporal regions. They deny any convulsions since 2000. Since then, she has been having recurrent stereotyped episodes starting with left-sided numbness and tingling on the arm, leg, face, and left side of her tongue. The left side of her head "feels like it is going weak." She describes electric shocks. There is no jerking or twitching but she describes them as spasms, then she feels weaker on the left side after. She cannot concentrate when this occurs, she speaks but slows down a lot, keeps saying "uhm" for several minutes. Apparently she can still function and do things when this occurs. She is sleepy afterwards and wakes up feeling better, but her daughter notices it takes a couple of days to return to her normal baseline. There is no associated headaches. She has difficulty saying how often they are, sometimes occurring twice a week, other times every other week. Last episode was 1.5 weeks ago. When she has them at night, she has really bad dreams. Her husband thinks she is having nightmares but when she wakes up she feels different. She denies any olfactory/gustatory hallucinations, rising epigastric sensation. She has noticed some gaps in time, her daughter has to repeat things some times.   Records from [place]  were reviewed. She was transitioned off carbamazepine to zonisamide in 2010 due to concerns for osteoporosis, however had abdominal pain and stomach burning, and was switched to Levetiracetam. 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 She reports that headaches worsened after the surgery and was started on Topiramate in 2015. She feels this has helped with the headaches but not the seizures. She is taking Topiramate 50mg  BID. Higher doses in the past caused side effects. She reports stopping the Keppra last February 2018 because her mood swings were horrible and anxiety was heightened. This has increased seizures frequency, but the anxiety and mood swings are better. Her Zoloft dose was also increased. She takes sumatriptan around twice a week on average for the migraines. Sleep is good. She denies any dizziness, diplopia, dysarthria/dysphagia, neck/back pain, bowel/bladder dysfunction. They have noticed she has a cough that would not stop if she has mint or a drink. She  Diagnostic Data: 03/20/2016 EEG normal wake and sleep EEG MRI brain 09/02/2015: Stable to slightly decreased size of rounded, well-circumscribed, homogenously  enhancing, extradural right cerebellar hemisphere lesion, again most consistent with a meningioma; this measures approximately 1.2 x 1.2 x 1.3 cm (AP x ML x CC) today. No new lesions suspicious for meningioma are seen. 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 She had meningioma resection in 2013. Otherwise she had a normal birth and early development.  There is no history of febrile convulsions, CNS infections such as meningitis/encephalitis, significant traumatic brain injury, or family history of seizures.  Prior AEDs: Tegretol, Dilantin, Keppra, Zonisamide  PAST MEDICAL  HISTORY: Past Medical History:  Diagnosis Date  . Anxiety   . Brain tumor ([place]) 2013  . Depression   . Epilepsy ([place])   . GERD (gastroesophageal reflux disease)   . Meningitis 2013  . Migraines   . Seizures ([facility])     MEDICATIONS: Current Outpatient Medications on File Prior to Visit  Medication Sig Dispense Refill  . acetaminophen (TYLENOL) 325 MG tablet Take by mouth.    . diclofenac sodium (VOLTAREN) 1 % GEL APPLY 2G THREE TIMES DAILY AS NEEDED FOR PAIN.    Marland Kitchen divalproex (DEPAKOTE ER) 250 MG 24 hr tablet Take 1 tablet every night 30 tablet 6  . ranitidine (ZANTAC) 150 MG capsule Take 150 mg by mouth as needed.     . sertraline (ZOLOFT) 50 MG tablet Take 3 tablets (150 mg total) by mouth at bedtime. 180 tablet 5  . SUMAtriptan (IMITREX) 100 MG tablet Take 1 tab by mouth at first sign of migraine-May repeat in 2 hrs-MAX 2 tabs/24 hrs 10 tablet 5  . topiramate (TOPAMAX) 50 MG tablet Take 2 tablets (100 mg total) by mouth 2 (two) times daily. 120 tablet 6   No current facility-administered medications on file prior to visit.     ALLERGIES: No Known Allergies  FAMILY HISTORY: Family History  Problem Relation Age of Onset  . Hypertension Mother   . Hypertension Father   . Hyperlipidemia Father   . Anxiety disorder Daughter   . Hearing loss Son   . Anxiety disorder  Son   . Other Daughter        vader syndrome  . Diabetes Neg Hx   . Cancer Neg Hx     SOCIAL HISTORY: Social History   Socioeconomic History  . Marital status: Married    Spouse name: Not on file  . Number of children: Not on file  . Years of education: Not on file  . Highest education level: Not on file  Occupational History  . Occupation: food Metallurgist: [employer]  . Financial resource strain: Not on file  . Food insecurity:    Worry: Not on file    Inability: Not on file  . Transportation needs:    Medical: Not on file    Non-medical: Not on file  Tobacco  Use  . Smoking status: Never Smoker  . Smokeless tobacco: Never Used  Substance and Sexual Activity  . Alcohol use: No  . Drug use: No  . Sexual activity: Yes    Birth control/protection: None  Lifestyle  . Physical activity:    Days per week: Not on file    Minutes per session: Not on file  . Stress: Not on file  Relationships  . Social connections:    Talks on phone: Not on file    Gets together: Not on file    Attends religious service: Not on file    Active member of club or organization: Not on file    Attends meetings of clubs or organizations: Not on file    Relationship status: Not on file  . Intimate partner violence:    Fear of current or ex partner: Not on file    Emotionally abused: Not on file    Physically abused: Not on file    Forced sexual activity: Not on file  Other Topics Concern  . Not on file  Social History Narrative   Originally from [place], non smoker and non drinker, lives with husband and a son; 3 children (2 daughters and 1 son), 1 daughter passed at age 81       Lives in 1 story home   9th grade education   Works with [employer]: Constitutional: No fevers, chills, or sweats, no generalized fatigue, change in appetite Eyes: No visual changes, double vision, eye pain Ear, nose and throat: No hearing loss, ear pain, nasal congestion, sore throat Cardiovascular: No chest pain, palpitations Respiratory:  No shortness of breath at rest or with exertion, wheezes GastrointestinaI: No nausea, vomiting, diarrhea, abdominal pain, fecal incontinence Genitourinary:  No dysuria, urinary retention or frequency Musculoskeletal:  No neck pain, back pain Integumentary: No rash, pruritus, skin lesions Neurological: as above Psychiatric: No depression, insomnia, anxiety Endocrine: No palpitations, fatigue, diaphoresis, mood swings, change in appetite, change in weight, increased thirst Hematologic/Lymphatic:  No anemia,  purpura, petechiae. Allergic/Immunologic: no itchy/runny eyes, nasal congestion, recent allergic reactions, rashes  PHYSICAL EXAM: Vitals:   08/24/17 1521  BP: 92/60  Pulse: 88  SpO2: 98%   General: No acute distress Head:  Normocephalic/atraumatic Neck: supple, no paraspinal tenderness, full range of motion Back: No paraspinal tenderness Heart: regular rate and rhythm Lungs: Clear to auscultation bilaterally. Vascular: No carotid bruits. Skin/Extremities: No rash, no edema Neurological Exam: Mental status: alert and oriented to person, place, and time, no dysarthria or aphasia, Fund of knowledge is appropriate.  Recent and remote memory are intact.  Attention and concentration are normal.  Able to name objects and repeat phrases. Cranial nerves: CN I: not tested CN II: pupils equal, round and reactive to light, visual fields intact CN III, IV, VI:  full range of motion, no nystagmus, no ptosis CN V: facial sensation intact CN VII: upper and lower face symmetric CN VIII: hearing intact to finger rub CN IX, X: gag intact, uvula midline CN XI: sternocleidomastoid and trapezius muscles intact CN XII: tongue midline Bulk & Tone: normal, no fasciculations. Motor: 5/5 throughout with no pronator drift. Sensation: intact to light touch.  Romberg test negative Deep Tendon Reflexes: +2 throughout Plantar responses: downgoing bilaterally Cerebellar: no incoordination on finger to nose testing Gait: narrow-based and steady, able to tandem walk adequately. Tremor: none  IMPRESSION: This is a 52 yo RH woman with a history of migraines, meningioma resection, and seizures suggestive of focal seizures without impairment of awareness, possibly arising from the right hemisphere, describing left-sided numbness and post-ictal weakness on the left side. However,a prior EEG reported left anterior temporal epileptiform discharges. 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 She has also had headaches after meningioma surgery.She reports an improvement in symptoms with Topamax 100mg  BID. She had side effects on higher doses in the past. We discussed keeping a calendar of her headaches and seizures, she will update [place] in 3 months, if increasing in frequency, we will add on low dose gabapentin qhs. She is aware of [place] driving laws to stop driving  after a seizure with loss of awareness, until 6 months seizure-free. She will follow-up in 6 months and knows to call for any changes.   Thank you for allowing me to participate in her care.  Please do not hesitate to call for any questions or concerns.  The duration of this appointment visit was 26 minutes of face-to-face time with the patient.  Greater than 50% of this time was spent in counseling, explanation of diagnosis, planning of further management, and coordination of care.   Victoria Bishop, M.D.   CC: Dr. Jonni Sanger

## 2017-08-28 MED FILL — SERTRALINE HCL 50 MG TABLET: 50 | 60 days supply | Qty: 180 | Fill #3

## 2017-09-14 MED FILL — TOPIRAMATE 50 MG TABLET: 50 | 30 days supply | Qty: 120 | Fill #1

## 2017-10-30 MED FILL — SERTRALINE HCL 50 MG TABLET: 50 | 60 days supply | Qty: 180 | Fill #4

## 2017-11-15 MED FILL — TOPIRAMATE 50 MG TABLET: 50 | 30 days supply | Qty: 120 | Fill #2

## 2017-12-31 MED FILL — SERTRALINE HCL 50 MG TABLET: 50 | 60 days supply | Qty: 180 | Fill #5

## 2018-01-07 MED FILL — TOPIRAMATE 50 MG TABLET: 50 | 30 days supply | Qty: 120 | Fill #3

## 2018-01-08 ENCOUNTER — Encounter: Payer: Self-pay | Admitting: Family Medicine

## 2018-01-08 ENCOUNTER — Other Ambulatory Visit: Payer: Self-pay

## 2018-01-08 ENCOUNTER — Ambulatory Visit: Payer: 59 | Admitting: Family Medicine

## 2018-01-08 VITALS — BP 96/62 | HR 75 | Temp 98.2°F | Ht 62.0 in | Wt 143.0 lb

## 2018-01-08 DIAGNOSIS — L509 Urticaria, unspecified: Secondary | ICD-10-CM

## 2018-01-08 DIAGNOSIS — Z23 Encounter for immunization: Secondary | ICD-10-CM

## 2018-01-08 MED ORDER — RANITIDINE HCL 150 MG PO CAPS: 150.0000 mg | ORAL_CAPSULE | Freq: Two times a day (BID) | ORAL | 2 refills | Status: DC

## 2018-01-08 MED FILL — raNITIdine HCL 150 MG TABS: 150 | 30 days supply | Qty: 60 | Fill #0

## 2018-01-08 NOTE — Progress Notes (Signed)
Subjective  CC:  Chief Complaint  Patient presents with  . Rash    on stomach and sides x 1 day, had some SOB yesterday but none today, treated with Benadryl     HPI: Victoria Bishop is a 52 y.o. female who presents to the office today to address the problems listed above in the chief complaint.  Pt has hives. Started yesterday after her shower. She used a new body wash. No other possible allergens identified: no new foods or meds. Reports she felt some tightness in her throat yesterday but resolved with benadryl. She took two doses. Hives responded but are back today. Itching. Otherwise feels fine. No h/o urticaria.    Assessment  1. Urticaria   2. Need for immunization against influenza      Plan   urticaria:  Possible due to body wash. Stable now. Start zyrtec, zantac and benadryl. F/u if worsens. To ER for SOB.   Follow up: Return in about 6 weeks (around 02/19/2018) for complete physical.  She now has insurance   Orders Placed This Encounter  Procedures  . Flu Vaccine QUAD 36+ mos IM   Meds ordered this encounter  Medications  . ranitidine (ZANTAC) 150 MG capsule    Sig: Take 1 capsule (150 mg total) by mouth 2 (two) times daily.    Dispense:  60 capsule    Refill:  2      I reviewed the patients updated PMH, FH, and SocHx.    Patient Active Problem List   Diagnosis Date Noted  . Chronic right-sided low back pain without sciatica 02/12/2017  . Lipoma of torso 02/12/2017  . Meningioma ([place]) 07/07/2016  . Major depression, recurrent, chronic ([place]) 06/23/2016  . Soft tissue mass 06/23/2016  . 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 Migraine without status migrainosus, not intractable 10/01/2015  . Helicobacter pylori (H. pylori) infection 08/17/2015  . Gastroesophageal reflux disease with esophagitis 08/13/2015  . Pterygium 08/23/2009   Current Meds  Medication Sig  . ranitidine (ZANTAC) 150 MG capsule Take 1 capsule (150 mg total) by mouth 2 (two) times  daily.  . sertraline (ZOLOFT) 50 MG tablet Take 3 tablets (150 mg total) by mouth at bedtime.  . SUMAtriptan (IMITREX) 100 MG tablet Take 1 tab by mouth at first sign of migraine-May repeat in 2 hrs-MAX 2 tabs/24 hrs  . topiramate (TOPAMAX) 50 MG tablet Take 2 tablets (100 mg total) by mouth 2 (two) times daily.  . [DISCONTINUED] ranitidine (ZANTAC) 150 MG capsule Take 150 mg by mouth as needed.     Allergies: Patient has No Known Allergies. Family History: Patient family history includes Anxiety disorder in her daughter and son; Hearing loss in her son; Hyperlipidemia in her father; Hypertension in her father and mother; Other in her daughter. Social History:  Patient  reports that she has never smoked. She has never used smokeless tobacco. She reports that she does not drink alcohol or use drugs.  Review of Systems: Constitutional: Negative for fever malaise or anorexia Cardiovascular: negative for chest pain Respiratory: negative for SOB or persistent cough Gastrointestinal: negative for abdominal pain  Objective  Vitals: BP 96/62   Pulse 75   Temp 98.2 F (36.8 C)   Ht 5\' 2"  (1.575 m)   Wt 143 lb (64.9 kg)   LMP 07/12/2014 (LMP Unknown)   BMI 26.16 kg/m  General: no acute distress , A&Ox3 HEENT: PEERL, conjunctiva normal, no angioedema, Oropharynx moist,neck is supple Cardiovascular:  RRR without  murmur or gallop.  Respiratory:  Good breath sounds bilaterally, CTAB with normal respiratory effort Skin:  Warm, hives in patches on torso. Scratching.      Commons side effects, risks, benefits, and alternatives for medications and treatment plan prescribed today were discussed, and the patient expressed understanding of the given instructions. Patient is instructed to call or message via MyChart if he/she has any questions or concerns regarding our treatment plan. No barriers to understanding were identified. We discussed Red Flag symptoms and signs in detail. Patient expressed  understanding regarding what to do in case of urgent or emergency type symptoms.   Medication list was reconciled, printed and provided to the patient in AVS. Patient instructions and summary information was reviewed with the patient as documented in the AVS. This note was prepared with assistance of Dragon voice recognition software. Occasional wrong-word or sound-a-like substitutions may have occurred due to the inherent limitations of voice recognition software

## 2018-01-08 NOTE — Patient Instructions (Addendum)
Please return in 1-3 months for your annual complete physical; please come fasting.  Start Zyrtec 10mg  nightly ranitidine twice a day for a week.  You may add benadryl every 4 hours if needed.    If you have any questions or concerns, please don't hesitate to send me a message via MyChart or call the office at [phone]. Thank you for visiting with [place] today! It's our pleasure caring for you.   Ronchas. Hives Las ronchas (urticaria) son reas enrojecidas e hinchadas en la piel que ocasionan picazn. Las ronchas pueden aparecer en cualquier parte del cuerpo y variar en tamao. Pueden ser del tamao de la punta de un bolgrafo o mucho ms grandes. Las ronchas suelen mejorar en el transcurso de 24horas (ronchas [place]). En otros casos, despus de que desaparecen, aparecen ronchas nuevas. [place] prolongarse [place] (ronchas crnicas). Las ronchas aparecen como consecuencia de una reaccin del cuerpo a un agente irritante o a algo a lo que usted es alrgico (factor desencadenante). Cuando se expone a un factor desencadenante, su cuerpo libera una sustancia qumica (histamina) que causa enrojecimiento, picazn e hinchazn. Las ronchas se pueden formar inmediatamente despus de exponerse a un factor desencadenante o bien al cabo de varias horas. No se transmiten de persona a persona (no son contagiosas). Las ronchas pueden empeorar al rascarse, Field seismologist ejercicios y por estrs emocional. Cules son las causas? Las causas de esta afeccin incluyen lo siguiente:  [place] a determinadas comidas o ingredientes.  Las picaduras o mordeduras de insectos.  Exposicin al polen o a la caspa de las mascotas.  Contacto con el ltex o con sustancias qumicas.  Exposicin a la luz del sol, al calor o al fro.  Actividad fsica.  El estrs.  Algunas enfermedades y tratamientos pueden ocasionar ronchas. Estos incluyen los siguientes:  Virus, incluido el virus de la  gripe.  Infecciones bacterianas, como infecciones en las vas urinarias y Teacher, adult education.  Trastornos, como la vasculitis, el lupus o la enfermedad tiroidea.  Determinados medicamentos.  Vacunas contra la Buyer, retail.  Transfusiones de [place].  Algunas veces, se desconocen los factores que causan las ronchas (ronchas idiopticas). Qu incrementa el riesgo? Es ms probable que esta afeccin se manifieste en:  Mujeres.  Personas que padecen [employer], en especial a los ctricos, la [place], los [place], el man, los frutos secos o los mariscos.  Personas que son alrgicas a lo siguiente: ? Medicamentos. ? Ltex. ? Insectos. ? Animales. ? Polen.  Personas que padecen ciertas afecciones, como el lupus o la enfermedad tiroidea.  Cules son los signos o los sntomas? El principal sntoma de esta afeccin es la presencia de parches o pequeos bultos elevados de color rojo o blanco en la piel que ocasionan picazn. Estas reas:  Pueden convertirse en ronchas grandes e inflamadas.  Pueden cambiar de forma y [place] rpida y repetida.  Pueden aparecer en forma de ronchas separadas o ronchas que se conectan y abarcan una zona extensa de la piel.  Pueden causar escozor o volverse dolorosas.  Pueden volverse blancas (palidecer) al presionar en el centro.  En casos graves, las [place], los pies y el rostro tambin pueden hincharse. Esto podra ocurrir si las ronchas se desarrollan ms profundamente en la piel. Cmo se diagnostica? Esta afeccin se diagnostica en funcin de los sntomas, los antecedentes mdicos y un examen fsico. Podran realizarle pruebas de [place], [place] o piel para descubrir qu es lo que causa las ronchas y Market researcher  problemas de salud. El mdico tambin podra tomar una pequea [place] de piel del rea afectada y analizarla con un microscopio (biopsia). Cmo se trata? El tratamiento depende de la gravedad de la enfermedad. El  mdico podra recomendarle aplicar paos mojados con agua fra (compresas fras) o tomar duchas con agua fra para Barrister's clerk. A veces, las ronchas se tratan con medicamentos, como por ejemplo:  Antihistamnicos.  Corticoides.  Antibiticos.  Medicamentos inyectables (omalizumab). El mdico podra recetar esto si usted presenta ronchas idiopticas y contina teniendo sntomas aun despus del tratamiento con antihistamnicos.  En los casos ms graves, podra ser necesario aplicar una inyeccin de adrenalina (epinefrina) para prevenir una reaccin alrgica potencialmente mortal (anafilaxis). Siga estas instrucciones en su casa: Medicamentos  Tome o aplquese los medicamentos de venta libre y recetados solamente como se lo haya indicado el mdico.  Si le recetaron un antibitico, tmelo como se lo haya indicado el mdico. No deje de tomar los antibiticos aunque comience a Sports administrator. Cuidado de la piel  Aplique compresas fras en las zonas afectadas.  No se rasque ni se refriegue la piel. Instrucciones generales  No se duche ni tome baos de inmersin con agua caliente. Podra empeorar la picazn.  No use ropa ajustada.  Use pantalla solar y ropa protectora cuando est al Alexis Goodell.  Evite las sustancias que causan las ronchas. Lleve un registro para Patent attorney de aquello que le ocasiona ronchas. Escriba los siguientes datos: ? Los medicamentos que toma. ? Lo que usted come y bebe. ? Los productos que [place] en la piel.  Concurra a todas las visitas de control como se lo haya indicado el mdico. Esto es importante. Comunquese con un mdico si:  Los sntomas no se [employer].  Le duelen las articulaciones o estas se inflaman. Solicite ayuda de inmediato si:  Tiene fiebre.  Siente dolor en el abdomen.  Tiene la lengua o los labios hinchados.  Tiene los prpados hinchados.  Siente el pecho o la garganta cerrados.  Tiene  problemas para respirar o tragar. Estos sntomas pueden representar un problema grave que constituye Engineer, maintenance (IT). No espere hasta que los sntomas desaparezcan. Solicite atencin mdica de inmediato. Comunquese con el servicio de emergencias de su localidad (911 en los [place]). No conduzca por sus propios medios [employer] Financial. Esta informacin no tiene Marine scientist el consejo del mdico. Asegrese de hacerle al mdico cualquier pregunta que tenga. Document Released: 02/27/2005 Document Revised: 06/02/2016 Document Reviewed: 12/16/2014 Elsevier Interactive Patient Education  2018 [employer].

## 2018-01-22 ENCOUNTER — Telehealth: Payer: 59 | Admitting: Nurse Practitioner

## 2018-01-22 DIAGNOSIS — M545 Low back pain, unspecified: Secondary | ICD-10-CM

## 2018-01-22 MED ORDER — CYCLOBENZAPRINE HCL 10 MG PO TABS: 10.0000 mg | ORAL_TABLET | Freq: Three times a day (TID) | ORAL | 1 refills | Status: DC | PRN

## 2018-01-22 MED ORDER — NAPROXEN 500 MG PO TABS: 500.0000 mg | ORAL_TABLET | Freq: Two times a day (BID) | ORAL | 1 refills | Status: DC

## 2018-01-22 NOTE — Progress Notes (Signed)

## 2018-01-23 MED ORDER — NAPROXEN 500 MG PO TABS: 500.0000 mg | ORAL_TABLET | Freq: Two times a day (BID) | ORAL | 1 refills | Status: DC

## 2018-01-23 MED ORDER — CYCLOBENZAPRINE HCL 10 MG PO TABS: 10.0000 mg | ORAL_TABLET | Freq: Three times a day (TID) | ORAL | 1 refills | Status: DC | PRN

## 2018-01-23 MED FILL — NAPROXEN 500 MG TABLET: 500 | 30 days supply | Qty: 60 | Fill #0

## 2018-01-23 MED FILL — CYCLOBENZAPRINE 10 MG TAB: 10 | 10 days supply | Qty: 30 | Fill #0

## 2018-01-23 NOTE — Addendum Note (Signed)
Addended by: Chevis Pretty on: 01/23/2018 08:11 AM   Modules accepted: Orders

## 2018-02-20 ENCOUNTER — Other Ambulatory Visit: Payer: Self-pay | Admitting: Family Medicine

## 2018-02-20 MED FILL — TOPIRAMATE 50 MG TABLET: 50 | 30 days supply | Qty: 120 | Fill #4

## 2018-02-20 MED FILL — SERTRALINE HCL 50 MG TABLET: 50 | 10 days supply | Qty: 30 | Fill #0

## 2018-03-14 ENCOUNTER — Encounter: Payer: Self-pay | Admitting: Family Medicine

## 2018-03-14 ENCOUNTER — Ambulatory Visit (INDEPENDENT_AMBULATORY_CARE_PROVIDER_SITE_OTHER): Payer: 59 | Admitting: Family Medicine

## 2018-03-14 ENCOUNTER — Other Ambulatory Visit: Payer: Self-pay

## 2018-03-14 VITALS — BP 116/60 | HR 88 | Temp 97.8°F | Resp 16 | Ht 62.25 in | Wt 146.6 lb

## 2018-03-14 DIAGNOSIS — Z1239 Encounter for other screening for malignant neoplasm of breast: Secondary | ICD-10-CM

## 2018-03-14 DIAGNOSIS — K2100 Gastro-esophageal reflux disease with esophagitis, without bleeding: Secondary | ICD-10-CM

## 2018-03-14 DIAGNOSIS — Z0000 Encounter for general adult medical examination without abnormal findings: Secondary | ICD-10-CM

## 2018-03-14 DIAGNOSIS — G40109 Localization-related (focal) (partial) symptomatic epilepsy and epileptic syndromes with simple partial seizures, not intractable, without status epilepticus: Secondary | ICD-10-CM

## 2018-03-14 DIAGNOSIS — F419 Anxiety disorder, unspecified: Secondary | ICD-10-CM

## 2018-03-14 DIAGNOSIS — F339 Major depressive disorder, recurrent, unspecified: Secondary | ICD-10-CM

## 2018-03-14 MED ORDER — HYDROXYZINE HCL 25 MG PO TABS: 25.0000 mg | ORAL_TABLET | Freq: Three times a day (TID) | ORAL | 2 refills | Status: DC | PRN

## 2018-03-14 MED ORDER — SERTRALINE HCL 50 MG PO TABS: 150.0000 mg | ORAL_TABLET | Freq: Every day | ORAL | 3 refills | Status: DC

## 2018-03-14 NOTE — Progress Notes (Signed)
Subjective  Chief Complaint  Patient presents with  . Annual Exam  . Depression    HPI: Victoria Bishop is a 53 y.o. female who presents to [place] at [facility] today for a Female Wellness Visit. She also has the concerns and/or needs as listed above in the chief complaint. These will be addressed in addition to the Health Maintenance Visit.   Wellness Visit: annual visit with health maintenance review and exam without Pap   HM: due mammogram. imms are up to date. Eating well. Has gained a few pounds.   Has paperwork that needs to be filled out to get her license renewed. Likely due to seizure d/o.  Chronic disease f/u and/or acute problem visit: (deemed necessary to be done in addition to the wellness visit):  Depression; reports depression is well controlled with sertraline but having anxiety at work. Becomes itchy; no rash. Not sure why anxiety is acting up.  GAD 7 : Generalized Anxiety Score 03/14/2018 02/12/2017  Nervous, Anxious, on Edge 2 2  Control/stop worrying 0 2  Worry too much - different things 0 2  Trouble relaxing 0 1  Restless 0 1  Easily annoyed or irritable 0 2  Afraid - awful might happen 0 1  Total GAD 7 Score 2 11  Anxiety Difficulty Not difficult at all Very difficult    Depression screen [facility] 2/9 03/14/2018 04/02/2017 02/12/2017  Decreased Interest 0 0 2  Down, Depressed, Hopeless 0 0 2  PHQ - 2 Score 0 0 4  Altered sleeping 0 1 2  Tired, decreased energy 0 0 1  Change in appetite 1 0 1  Feeling bad or failure about yourself  0 0 0  Trouble concentrating 1 0 1  Moving slowly or fidgety/restless 1 1 3   Suicidal thoughts 0 0 1  PHQ-9 Score 3 2 13   Difficult doing work/chores Somewhat difficult Not difficult at all Very difficult    GERD: doing well on meds.   Assessment  1. Annual physical exam   2. Breast cancer screening   3. Gastroesophageal reflux disease with esophagitis   4. Major depression, recurrent, chronic ([place])     5. Anxiety   6. Partial epilepsy [facility])      Plan  Female Wellness Visit:  Age appropriate Health Maintenance and Prevention measures were discussed with patient. Included topics are cancer screening recommendations, ways to keep healthy (see AVS) including dietary and exercise recommendations, regular eye and dental care, use of seat belts, and avoidance of moderate alcohol use and tobacco use. Ordered a mammogram.  BMI: discussed patient's BMI and encouraged positive lifestyle modifications to help get to or maintain a target BMI.  HM needs and immunizations were addressed and ordered. See below for orders. See HM and immunization section for updates.  Routine labs and screening tests ordered including cmp, cbc and lipids where appropriate.  Discussed recommendations regarding Vit D and calcium supplementation (see AVS)  Chronic disease management visit and/or acute problem visit:  Depression and anxiety. Refilled sertraline. Added hydroxyzine.  GERD:This medical condition is well controlled. There are no signs of complications, medication side effects, or red flags. Patient is instructed to continue the current treatment plan without change in therapies or medications.   Seizure d/o: per neuro.   Follow up: No follow-ups on file.  Orders Placed This Encounter  Procedures  . MM DIGITAL SCREENING BILATERAL  . CBC with Differential/Platelet  . Comprehensive metabolic panel  . Lipid panel  .  HIV Antibody (routine testing w rflx)  . Hepatitis C antibody  . TSH   Meds ordered this encounter  Medications  . sertraline (ZOLOFT) 50 MG tablet    Sig: Take 3 tablets (150 mg total) by mouth at bedtime.    Dispense:  270 tablet    Refill:  3  . hydrOXYzine (ATARAX/VISTARIL) 25 MG tablet    Sig: Take 1 tablet (25 mg total) by mouth 3 (three) times daily as needed for anxiety or itching.    Dispense:  60 tablet    Refill:  2      Lifestyle: Body mass index is 26.6 kg/m. Wt  Readings from Last 3 Encounters:  03/14/18 146 lb 9.6 oz (66.5 kg)  01/08/18 143 lb (64.9 kg)  08/24/17 140 lb (63.5 kg)    Patient Active Problem List   Diagnosis Date Noted  . Chronic right-sided low back pain without sciatica 02/12/2017  . Lipoma of torso 02/12/2017  . Meningioma ([place]) 07/07/2016    Overview:  MRI 08/2015 [facility] Findings: Stable to slightly decreased size of rounded, well-circumscribed,  homogenously  enhancing, extradural right cerebellar hemisphere lesion, again most  consistent  with a meningioma; this measures approximately 1.2 x 1.2 x 1.3 cm (AP x ML  x CC)  today. No new lesions suspicious for meningioma are seen. 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 The ventricles are of normal size, shape, and contour for the patient's  age. The  brain stem, cerebellum, and cerebral hemispheres have a normal  morphologic  appearance as well as MR signal intensity on all pulse sequences. No  abnormal  enhancement is evident. There are no areas of restricted diffusion on  diffusion  weighted imaging to suggest an acute infarct. The visualized portions of  the  orbits, calvarium, paranasal sinuses, and skull base demonstrate no other  significant abnormality. Overview:  MRI 08/2015 [facility]  Findings: Stable to slightly decreased size of rounded, well-circumscribed, homogenously  enhancing, extradural right cerebellar hemisphere lesion, again most consistent  with a meningioma; this measures approximately 1.2 x 1.2 x 1.3 cm (AP x ML x CC)  today. No new lesions suspicious for meningioma are seen. 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  The ventricles are of normal size, shape, and contour for the patient's age. The  brain stem, cerebellum, and cerebral hemispheres have a normal morphologic  appearance as well as MR signal intensity on all pulse sequences. No abnormal  enhancement is evident. There are no areas of restricted diffusion on diffusion  weighted imaging to suggest an acute infarct. The visualized portions of the  orbits, calvarium, paranasal sinuses, and skull base demonstrate no other  significant abnormality.   . Major depression, recurrent, chronic ([place]) 06/23/2016  . Soft tissue mass 06/23/2016    Overview:  Left low back  Overview:  Left low back    . Partial epilepsy ([place]) 10/01/2015  . Migraine without status migrainosus, not intractable 10/01/2015  . Helicobacter pylori (H. pylori) infection 08/17/2015  . Gastroesophageal reflux disease with esophagitis 08/13/2015  . Pterygium 08/23/2009   Health Maintenance  Topic Date Due  . Hepatitis C Screening  1966/01/25  .  HIV Screening  01/10/1979  . MAMMOGRAM  03/24/2016  . TETANUS/TDAP  02/09/2021  . PAP SMEAR-Modifier  07/07/2021  . COLONOSCOPY  04/29/2025  . INFLUENZA VACCINE  Completed   Immunization History  Administered Date(s) Administered  . Influenza Split 11/29/2007, 01/22/2009, 12/31/2009, 12/23/2010  . Influenza,inj,Quad PF,6+ Mos 12/31/2014, 02/12/2017, 01/08/2018  . Influenza,inj,quad, With Preservative 01/21/2016  . Influenza-Unspecified 03/17/2008  . PPD Test 11/29/2007  . Tdap 02/10/2011   We updated and reviewed the patient's past history in detail and it is documented below. Allergies: Patient has No Known Allergies. Past Medical History Patient  has a past medical history of Anxiety, Brain tumor ([place]) (2013), Depression, Epilepsy ([place]), GERD (gastroesophageal reflux disease), Meningitis (2013), Migraines, and Seizures ([place]). Past Surgical History Patient  has a past surgical history that includes Cesarean  section; Brain surgery (2014); and CT RADIATION THERAPY GUIDE (2014). Family History: Patient family history includes Anxiety disorder in her daughter and son; Hearing loss in her son; Hyperlipidemia in her father; Hypertension in her father and mother; Other in her daughter. Social History:  Patient  reports that she has never smoked. She has never used smokeless tobacco. She reports that she does not drink alcohol or use drugs.  Review of Systems: Constitutional: negative for fever or malaise Ophthalmic: negative for photophobia, double vision or loss of vision Cardiovascular: negative for chest pain, dyspnea on exertion, or new LE swelling Respiratory: negative for SOB or persistent cough Gastrointestinal: negative for abdominal pain, change in bowel habits or melena Genitourinary: negative for dysuria or gross hematuria, no abnormal uterine bleeding or disharge Musculoskeletal: negative for new gait disturbance or muscular weakness Integumentary: negative for new or persistent rashes, no breast lumps Neurological: negative for TIA or stroke symptoms Psychiatric: negative for SI or delusions Allergic/Immunologic: negative for hives  Patient Care Team    Relationship Specialty Notifications Start End  Leamon Arnt, MD PCP - General Family Medicine  02/12/17   Cameron Sprang, MD Consulting Physician Neurology  03/14/18     Objective  Vitals: BP 116/60   Pulse 88   Temp 97.8 F (36.6 C)   Resp 16   Ht 5' 2.25" (1.581 m)   Wt 146 lb 9.6 oz (66.5 kg)   LMP 07/12/2014 (LMP Unknown)   SpO2 98%   BMI 26.60 kg/m  General:  Well developed, well nourished, no acute distress  Psych:  Alert and orientedx3,normal mood and affect HEENT:  Normocephalic, atraumatic, non-icteric sclera, PERRL, oropharynx is clear without mass or exudate, supple neck without adenopathy, mass or thyromegaly Cardiovascular:  Normal S1, S2, RRR without gallop, rub or murmur, nondisplaced PMI Respiratory:  Good  breath sounds bilaterally, CTAB with normal respiratory effort Gastrointestinal: normal bowel sounds, soft, non-tender, no noted masses. No HSM MSK: no deformities, contusions. Joints are without erythema or swelling. Spine and CVA region are nontender Skin:  Warm, no rashes or suspicious lesions noted Neurologic:    Mental status is normal. CN 2-11 are normal. Gross motor and sensory exams are normal. Normal gait. No tremor Breast Exam: No mass, skin retraction or nipple discharge is appreciated in either breast.inverted nipples bilaterally. No axillary adenopathy. Fibrocystic changes are not noted    Commons side effects, risks, benefits, and alternatives for medications and treatment plan prescribed today were discussed, and the patient expressed understanding of the given instructions. Patient is instructed to call or message via MyChart if he/she has any questions or concerns regarding our treatment plan. No barriers to understanding were  identified. We discussed Red Flag symptoms and signs in detail. Patient expressed understanding regarding what to do in case of urgent or emergency type symptoms.   Medication list was reconciled, printed and provided to the patient in AVS. Patient instructions and summary information was reviewed with the patient as documented in the AVS. This note was prepared with assistance of Dragon voice recognition software. Occasional wrong-word or sound-a-like substitutions may have occurred due to the inherent limitations of voice recognition software

## 2018-03-14 NOTE — Patient Instructions (Signed)
Please return in 3 months for Recheck of your anxiety.  You may start hydroxyzine up to 3x/day as needed for anxiety or itching.   If you have any questions or concerns, please don't hesitate to send me a message via MyChart or call the office at [phone]. Thank you for visiting with [place] today! It's our pleasure caring for you.  Please do these things to maintain good health!   Exercise at least 30-45 minutes a day,  4-5 days a week.   Eat a low-fat diet with lots of fruits and vegetables, up to 7-9 servings per day.  Drink plenty of water daily. Try to drink 8 8oz glasses per day.  Seatbelts can save your life. Always wear your seatbelt.  Place Smoke Detectors on every level of your home and check batteries every year.  Schedule an appointment with an eye doctor for an eye exam every 1-2 years  Safe sex - use condoms to protect yourself from STDs if you could be exposed to these types of infections. Use birth control if you do not want to become pregnant and are sexually active.  Avoid heavy alcohol use. If you drink, keep it to less than 2 drinks/day and not every day.  [place].  Choose someone you trust that could speak for you if you became unable to speak for yourself.  Depression is common in our stressful world.If you're feeling down or losing interest in things you normally enjoy, please come in for a visit.  If anyone is threatening or hurting you, please get help. Physical or Emotional Violence is never OK.

## 2018-03-15 ENCOUNTER — Encounter: Payer: Self-pay | Admitting: *Deleted

## 2018-03-15 ENCOUNTER — Other Ambulatory Visit: Payer: Self-pay | Admitting: Family Medicine

## 2018-03-15 LAB — CBC WITH DIFFERENTIAL/PLATELET
BASOS PCT: 0.9 % (ref 0.0–3.0)
Basophils Absolute: 0 10*3/uL (ref 0.0–0.1)
EOS PCT: 0.9 % (ref 0.0–5.0)
Eosinophils Absolute: 0 10*3/uL (ref 0.0–0.7)
HEMATOCRIT: 37.8 % (ref 36.0–46.0)
Hemoglobin: 12.9 g/dL (ref 12.0–15.0)
LYMPHS PCT: 39.9 % (ref 12.0–46.0)
Lymphs Abs: 2.2 10*3/uL (ref 0.7–4.0)
MCHC: 34.2 g/dL (ref 30.0–36.0)
MCV: 91.1 fl (ref 78.0–100.0)
MONOS PCT: 5.1 % (ref 3.0–12.0)
Monocytes Absolute: 0.3 10*3/uL (ref 0.1–1.0)
NEUTROS ABS: 2.9 10*3/uL (ref 1.4–7.7)
Neutrophils Relative %: 53.2 % (ref 43.0–77.0)
Platelets: 278 10*3/uL (ref 150.0–400.0)
RBC: 4.15 Mil/uL (ref 3.87–5.11)
RDW: 12.8 % (ref 11.5–15.5)
WBC: 5.5 10*3/uL (ref 4.0–10.5)

## 2018-03-15 LAB — HEPATITIS C ANTIBODY
Hepatitis C Ab: NONREACTIVE
SIGNAL TO CUT-OFF: 0.02 (ref <1.00)

## 2018-03-15 LAB — LIPID PANEL
CHOL/HDL RATIO: 6
CHOLESTEROL: 264 mg/dL — AB (ref 0–200)
HDL: 46.3 mg/dL (ref >39.00)
NonHDL: 217.78
TRIGLYCERIDES: 310 mg/dL — AB (ref 0.0–149.0)
VLDL: 62 mg/dL — ABNORMAL HIGH (ref 0.0–40.0)

## 2018-03-15 LAB — COMPREHENSIVE METABOLIC PANEL
ALT: 18 U/L (ref 0–35)
AST: 20 U/L (ref 0–37)
Albumin: 4.3 g/dL (ref 3.5–5.2)
Alkaline Phosphatase: 77 U/L (ref 39–117)
BUN: 14 mg/dL (ref 6–23)
CALCIUM: 9.4 mg/dL (ref 8.4–10.5)
CHLORIDE: 107 meq/L (ref 96–112)
CO2: 24 mEq/L (ref 19–32)
CREATININE: 0.87 mg/dL (ref 0.40–1.20)
GFR: 72.07 mL/min (ref >60.00)
Glucose, Bld: 92 mg/dL (ref 70–99)
POTASSIUM: 3.8 meq/L (ref 3.5–5.1)
Sodium: 140 mEq/L (ref 135–145)
Total Bilirubin: 0.4 mg/dL (ref 0.2–1.2)
Total Protein: 6.8 g/dL (ref 6.0–8.3)

## 2018-03-15 LAB — HIV ANTIBODY (ROUTINE TESTING W REFLEX): HIV 1&2 Ab, 4th Generation: NONREACTIVE

## 2018-03-15 LAB — LDL CHOLESTEROL, DIRECT: Direct LDL: 173 mg/dL

## 2018-03-15 LAB — TSH: TSH: 1.38 u[IU]/mL (ref 0.35–4.50)

## 2018-03-18 ENCOUNTER — Other Ambulatory Visit: Payer: Self-pay | Admitting: *Deleted

## 2018-03-18 MED ORDER — SERTRALINE HCL 50 MG PO TABS: 150.0000 mg | ORAL_TABLET | Freq: Every day | ORAL | 3 refills | Status: DC

## 2018-03-18 MED ORDER — HYDROXYZINE HCL 25 MG PO TABS: 25.0000 mg | ORAL_TABLET | Freq: Three times a day (TID) | ORAL | 2 refills | Status: DC | PRN

## 2018-03-18 MED FILL — SERTRALINE HCL 50 MG TABLET: 50 | 90 days supply | Qty: 270 | Fill #0

## 2018-03-18 MED FILL — hydrOXYzine HCL 25 MG TABS: 25 | 20 days supply | Qty: 60 | Fill #0

## 2018-03-22 ENCOUNTER — Telehealth: Payer: Self-pay | Admitting: Family Medicine

## 2018-03-22 NOTE — Progress Notes (Signed)
Pt's daughter, Baxter Kail, given results per Dr Rosendo Gros Andy,"Please call patient: lab results look mostly good; however cholesterol is elevated: recommend a low fat diet and starting simvastatin 10mg  nightly. Please order if she is willing to take. We will recheck in office in 3 months. Please schedule OV if not already scheduled (f/u lipids and anxiety)."; she verbalizes understanding and verifies that the pt uses [facility]; she also schedules 3 month follow up appointment for lipids and anxiety with Dr Jonni Sanger, Adah Salvage, 06/17/2018 at 0845; will route to office for notification; unable to chart in result note because no encounter created.

## 2018-03-22 NOTE — Telephone Encounter (Signed)
Please advise 

## 2018-03-22 NOTE — Telephone Encounter (Signed)
Pt's daughter, Baxter Kail, given results per Dr Rosendo Gros Andy,"Please call patient: lab results look mostly good; however cholesterol is elevated: recommend a low fat diet and starting simvastatin 10mg  nightly. Please order if she is willing to take. We will recheck in office in 3 months. Please schedule OV if not already scheduled (f/u lipids and anxiety)."; she verbalizes understanding and verifies that the pt uses [facility]; she also schedules 3 month follow up appointment for lipids and anxiety with Dr Jonni Sanger, Adah Salvage, 06/17/2018 at 0845; will route to office for notification; unable to chart in result note because no encounter created.

## 2018-03-27 ENCOUNTER — Telehealth: Payer: Self-pay

## 2018-03-27 NOTE — Telephone Encounter (Signed)
Attempted to contact pt to let her know that DMV forms have been completed.  They need to be signed by pt prior to be sent to Department of Transportation. Number listed on chart is not working.  Will send MyChart message.

## 2018-03-29 ENCOUNTER — Encounter: Payer: Self-pay | Admitting: Family Medicine

## 2018-04-02 MED FILL — TOPIRAMATE 50 MG TABLET: 50 | 30 days supply | Qty: 120 | Fill #5

## 2018-04-04 ENCOUNTER — Other Ambulatory Visit: Payer: Self-pay

## 2018-04-04 ENCOUNTER — Encounter (HOSPITAL_COMMUNITY): Payer: Self-pay

## 2018-04-04 ENCOUNTER — Ambulatory Visit (HOSPITAL_COMMUNITY)
Admission: EM | Admit: 2018-04-04 | Discharge: 2018-04-04 | Disposition: A | Discharge status: Home / Self Care | Payer: 59 | Attending: Internal Medicine | Admitting: Internal Medicine

## 2018-04-04 DIAGNOSIS — E876 Hypokalemia: Secondary | ICD-10-CM

## 2018-04-04 DIAGNOSIS — R51 Headache: Secondary | ICD-10-CM

## 2018-04-04 DIAGNOSIS — B9789 Other viral agents as the cause of diseases classified elsewhere: Secondary | ICD-10-CM

## 2018-04-04 DIAGNOSIS — E860 Dehydration: Secondary | ICD-10-CM

## 2018-04-04 DIAGNOSIS — R42 Dizziness and giddiness: Secondary | ICD-10-CM

## 2018-04-04 DIAGNOSIS — R05 Cough: Secondary | ICD-10-CM

## 2018-04-04 DIAGNOSIS — J060 Acute laryngopharyngitis: Secondary | ICD-10-CM | POA: Insufficient documentation

## 2018-04-04 LAB — BASIC METABOLIC PANEL
Anion gap: 7 (ref 5–15)
BUN: 11 mg/dL (ref 6–20)
CO2: 22 mmol/L (ref 22–32)
Calcium: 8.7 mg/dL — ABNORMAL LOW (ref 8.9–10.3)
Chloride: 110 mmol/L (ref 98–111)
Creatinine, Ser: 0.87 mg/dL (ref 0.44–1.00)
GFR calc Af Amer: >60 mL/min (ref >60)
GFR calc non Af Amer: >60 mL/min (ref >60)
Glucose, Bld: 122 mg/dL — ABNORMAL HIGH (ref 70–99)
Potassium: 3.3 mmol/L — ABNORMAL LOW (ref 3.5–5.1)
Sodium: 139 mmol/L (ref 135–145)

## 2018-04-04 LAB — CBC
HCT: 37.6 % (ref 36.0–46.0)
Hemoglobin: 12.5 g/dL (ref 12.0–15.0)
MCH: 29.8 pg (ref 26.0–34.0)
MCHC: 33.2 g/dL (ref 30.0–36.0)
MCV: 89.7 fL (ref 80.0–100.0)
Platelets: 210 10*3/uL (ref 150–400)
RBC: 4.19 MIL/uL (ref 3.87–5.11)
RDW: 12.4 % (ref 11.5–15.5)
WBC: 6.7 10*3/uL (ref 4.0–10.5)
nRBC: 0 % (ref 0.0–0.2)

## 2018-04-04 LAB — POCT RAPID STREP A: Streptococcus, Group A Screen (Direct): NEGATIVE

## 2018-04-04 MED ORDER — ACETAMINOPHEN 325 MG PO TABS
ORAL_TABLET | ORAL | Status: AC
  Filled 2018-04-04: qty 2

## 2018-04-04 MED ORDER — ACETAMINOPHEN 325 MG PO TABS
650.0000 mg | ORAL_TABLET | Freq: Once | ORAL | Status: AC
  Administered 2018-04-04: 650 mg via ORAL

## 2018-04-04 MED ORDER — SALINE SPRAY 0.65 % NA SOLN: 1.0000 | NASAL | 0 refills | Status: DC | PRN

## 2018-04-04 MED ORDER — BENZOCAINE-MENTHOL 15-2.3 MG MT LOZG: 1.0000 | LOZENGE | OROMUCOSAL | 0 refills | Status: DC | PRN

## 2018-04-04 MED ORDER — SODIUM CHLORIDE 0.9 % IV BOLUS
1000.0000 mL | Freq: Once | INTRAVENOUS | Status: AC
  Administered 2018-04-04: 1000 mL via INTRAVENOUS

## 2018-04-04 NOTE — ED Triage Notes (Signed)
Pt cc sore throat, nausea, body aches, coughing and fever x 4 days.

## 2018-04-04 NOTE — ED Provider Notes (Signed)
[place]    CSN: [id number] Arrival date & time: 04/04/18  0803     History   Chief Complaint Chief Complaint  Patient presents with  . Sore Throat    HPI Victoria Bishop is a 53 y.o. female with a history of seizures controlled with Topamax, depression controlled with Zoloft comes to the urgent care with a four-day history of sore throat, headaches, dizziness and cough not productive of sputum.  Patient symptoms started insidiously and is getting progressively worse.  She describes subjective fever without chills.  She has not tried any medications so far.  Sore throat is not associated with nausea, vomiting or diarrhea.  She admits to sick contact (patient's husband was sick for about 7 days and is beginning to get better).Marland Kitchen   HPI  Past Medical History:  Diagnosis Date  . Anxiety   . Brain tumor ([place]) 2013  . Depression   . Epilepsy ([place])   . GERD (gastroesophageal reflux disease)   . Meningitis 2013  . Migraines   . Seizures [facility] [address])     Patient Active Problem List   Diagnosis Date Noted  . Chronic right-sided low back pain without sciatica 02/12/2017  . Lipoma of torso 02/12/2017  . Meningioma ([place]) 07/07/2016  . Major depression, recurrent, chronic ([place]) 06/23/2016  . Soft tissue mass 06/23/2016  . Partial epilepsy ([place]) 10/01/2015  . Migraine without status migrainosus, not intractable 10/01/2015  . Helicobacter pylori (H. pylori) infection 08/17/2015  . Gastroesophageal reflux disease with esophagitis 08/13/2015  . Pterygium 08/23/2009    Past Surgical History:  Procedure Laterality Date  . BRAIN SURGERY  2014  . CESAREAN SECTION    . CT RADIATION THERAPY GUIDE  2014    OB History   No obstetric history on file.      Home Medications    Prior to Admission medications   Medication Sig Start Date End Date Taking? Authorizing Provider  acetaminophen (TYLENOL) 325 MG tablet Take by mouth.    [provider]  Benzocaine-Menthol  (CEPACOL) 15-2.3 MG LOZG Use as directed 1 tablet in the mouth or throat as needed. 04/04/18   Rayya Yagi, Myrene Galas, MD  cyclobenzaprine (FLEXERIL) 10 MG tablet Take 1 tablet (10 mg total) by mouth 3 (three) times daily as needed for muscle spasms. 01/23/18   Hassell Done Mary-Margaret, FNP  hydrOXYzine (ATARAX/VISTARIL) 25 MG tablet Take 1 tablet (25 mg total) by mouth 3 (three) times daily as needed for anxiety or itching. 03/18/18   Leamon Arnt, MD  naproxen (NAPROSYN) 500 MG tablet Take 1 tablet (500 mg total) by mouth 2 (two) times daily with a meal. 01/23/18   Hassell Done, Mary-Margaret, FNP  ranitidine (ZANTAC) 150 MG capsule Take 1 capsule (150 mg total) by mouth 2 (two) times daily. 01/08/18   Leamon Arnt, MD  sertraline (ZOLOFT) 50 MG tablet Take 3 tablets (150 mg total) by mouth at bedtime. 03/18/18   Leamon Arnt, MD  sodium chloride (OCEAN) 0.65 % SOLN nasal spray Place 1 spray into both nostrils as needed for congestion. 04/04/18   LampteyMyrene Galas, MD  SUMAtriptan (IMITREX) 100 MG tablet Take 1 tab by mouth at first sign of migraine-May repeat in 2 hrs-MAX 2 tabs/24 hrs 05/11/17   Cameron Sprang, MD  topiramate (TOPAMAX) 50 MG tablet Take 2 tablets (100 mg total) by mouth 2 (two) times daily. 08/24/17   Cameron Sprang, MD    Family History Family History  Problem Relation  Age of Onset  . Hypertension Mother   . Hypertension Father   . Hyperlipidemia Father   . Anxiety disorder Daughter   . Hearing loss Son   . Anxiety disorder Son   . Other Daughter        vader syndrome  . Diabetes Neg Hx   . Cancer Neg Hx     Social History Social History   Tobacco Use  . Smoking status: Never Smoker  . Smokeless tobacco: Never Used  Substance Use Topics  . Alcohol use: No  . Drug use: No     Allergies   Patient has no known allergies.   Review of Systems Review of Systems  Constitutional: Negative for diaphoresis and unexpected weight change.  HENT: Positive for rhinorrhea.  Negative for sinus pressure and tinnitus.   Eyes: Negative for photophobia and pain.  Respiratory: Negative for stridor.   Cardiovascular: Negative for chest pain and leg swelling.  Gastrointestinal: Negative for constipation and diarrhea.  Endocrine: Negative for cold intolerance and heat intolerance.  Genitourinary: Negative for difficulty urinating and dysuria.  Neurological: Negative for tremors, seizures and speech difficulty.  Hematological: Bruises/bleeds easily.  Psychiatric/Behavioral: Negative for confusion, dysphoric mood and hallucinations.     Physical Exam Triage Vital Signs ED Triage Vitals  Enc Vitals Group     BP 04/04/18 0823 100/68     Pulse Rate 04/04/18 0823 (!) 110     Resp 04/04/18 0823 16     Temp 04/04/18 0823 99.8 F (37.7 C)     Temp Source 04/04/18 0823 Oral     SpO2 04/04/18 0823 98 %     Weight 04/04/18 0822 144 lb (65.3 kg)     Height --      Head Circumference --      Peak Flow --      Pain Score 04/04/18 0822 8     Pain Loc --      Pain Edu? --      Excl. in [place]? --    No data found.  Updated Vital Signs BP 100/68 (BP Location: Left Arm)   Pulse (!) 110   Temp 99.8 F (37.7 C) (Oral)   Resp 16   Wt 65.3 kg   LMP 07/12/2014 (LMP Unknown)   SpO2 98%   BMI 26.13 kg/m   Visual Acuity Right Eye Distance:   Left Eye Distance:   Bilateral Distance:    Right Eye Near:   Left Eye Near:    Bilateral Near:     Physical Exam Constitutional:      General: She is in acute distress.     Appearance: She is ill-appearing.  HENT:     Head: Normocephalic.     Right Ear: Tympanic membrane normal.     Nose: Congestion present.     Mouth/Throat:     Mouth: Mucous membranes are moist.     Pharynx: Uvula midline. Posterior oropharyngeal erythema present.     Tonsils: Swelling: 0 on the right. 0 on the left.  Eyes:     Conjunctiva/sclera: Conjunctivae normal.  Neck:     Musculoskeletal: Normal range of motion.     Thyroid: No thyromegaly.   Cardiovascular:     Rate and Rhythm: Tachycardia present.     Heart sounds: Normal heart sounds. No murmur. No friction rub. No gallop.   Pulmonary:     Effort: No respiratory distress.     Breath sounds: No wheezing, rhonchi or rales.  Abdominal:  General: There is no distension.     Palpations: Abdomen is soft.     Tenderness: There is no abdominal tenderness. There is no rebound.  Lymphadenopathy:     Cervical: No cervical adenopathy.  Skin:    General: Skin is warm.     Capillary Refill: Capillary refill takes less than 2 seconds.     Findings: No rash.  Neurological:     Mental Status: She is alert and oriented to person, place, and time.  Psychiatric:        Behavior: Behavior normal.    Acute viral illness with clinical dehydration: -IV fluids-normal saline 1 L bolus - Cultures pending - Symptom management with over-the-counter Tylenol/NSAIDs -Cepacol lozenges, saline nasal spray  Hypokalemia: Encourage Gatorade Liberalize oral fluid intake  Treatment in urgent care: Normal saline bolus was given CBC showed a normal WBC, basic metabolic panel showed hypokalemia of 3.3 Patient advised to optimize fluid intake  UC Treatments / Results  Labs (all labs ordered are listed, but only abnormal results are displayed) Labs Reviewed  BASIC METABOLIC PANEL - Abnormal; Notable for the following components:      Result Value   Potassium 3.3 (*)    Glucose, Bld 122 (*)    Calcium 8.7 (*)    All other components within normal limits  CULTURE, GROUP A STREP [facility])  CBC  POCT RAPID STREP A    EKG None  Radiology No results found.  Procedures Procedures (including critical care time)  Medications Ordered in UC Medications  sodium chloride 0.9 % bolus 1,000 mL (0 mLs Intravenous Stopped 04/04/18 1009)  acetaminophen (TYLENOL) tablet 650 mg (650 mg Oral Given 04/04/18 0941)    Initial Impression / Assessment and Plan / UC Course  I have reviewed the triage  vital signs and the nursing notes.  Pertinent labs & imaging results that were available during my care of the patient were reviewed by me and considered in my medical decision making (see chart for details).     1. Final Clinical Impressions(s) / UC Diagnoses   Final diagnoses:  Acute laryngopharyngitis   Discharge Instructions   None    ED Prescriptions    Medication Sig Dispense Auth. Provider   sodium chloride (OCEAN) 0.65 % SOLN nasal spray Place 1 spray into both nostrils as needed for congestion. 60 mL Seraj Dunnam, Myrene Galas, MD   Benzocaine-Menthol (CEPACOL) 15-2.3 MG LOZG Use as directed 1 tablet in the mouth or throat as needed. 16 lozenge Joram Venson, Myrene Galas, MD     Controlled Substance Prescriptions [place] Controlled Substance Registry consulted? No   Chase Picket, MD 04/04/18 1640

## 2018-04-05 MED FILL — SUMAtriptan SUCCINATE 100 M: 100 | 30 days supply | Qty: 10 | Fill #1

## 2018-04-06 LAB — CULTURE, GROUP A STREP (THRC)

## 2018-04-09 ENCOUNTER — Encounter: Payer: Self-pay | Admitting: Neurology

## 2018-04-09 ENCOUNTER — Ambulatory Visit: Payer: 59 | Admitting: Neurology

## 2018-04-09 VITALS — BP 90/60 | HR 94 | Ht 62.0 in | Wt 142.2 lb

## 2018-04-09 DIAGNOSIS — G43009 Migraine without aura, not intractable, without status migrainosus: Secondary | ICD-10-CM | POA: Diagnosis not present

## 2018-04-09 DIAGNOSIS — G40209 Localization-related (focal) (partial) symptomatic epilepsy and epileptic syndromes with complex partial seizures, not intractable, without status epilepticus: Secondary | ICD-10-CM | POA: Diagnosis not present

## 2018-04-09 MED ORDER — SUMATRIPTAN SUCCINATE 100 MG PO TABS: ORAL_TABLET | ORAL | 5 refills | Status: DC

## 2018-04-09 MED ORDER — TOPIRAMATE 50 MG PO TABS: ORAL_TABLET | ORAL | 11 refills | Status: DC

## 2018-04-09 NOTE — Patient Instructions (Signed)
1. Increase Topamax 50mg : Take 3 tablets twice a day 2. Keep a calendar of your seizures 3. Follow-up in 6 months, call for any changes  Seizure Precautions: 1. If medication has been prescribed for you to prevent seizures, take it exactly as directed.  Do not stop taking the medicine without talking to your doctor first, even if you have not had a seizure in a long time.   2. Avoid activities in which a seizure would cause danger to yourself or to others.  Don't operate dangerous machinery, swim alone, or climb in high or dangerous places, such as on ladders, roofs, or girders.  Do not drive unless your doctor says you may.  3. If you have any warning that you may have a seizure, lay down in a safe place where you can't hurt yourself.    4.  No driving for 6 months from last seizure, as per [facility].   Please refer to the following link on the [place] website for more information: http://www.epilepsyfoundation.org/answerplace/Social/driving/drivingu.cfm   5.  Maintain good sleep hygiene. Avoid alcohol.  6.  Contact your doctor if you have any problems that may be related to the medicine you are taking.  7.  Call 911 and bring the patient back to the ED if:        A.  The seizure lasts longer than 5 minutes.       B.  The patient doesn't awaken shortly after the seizure  C.  The patient has new problems such as difficulty seeing, speaking or moving  D.  The patient was injured during the seizure  E.  The patient has a temperature over 102 F (39C)  F.  The patient vomited and now is having trouble breathing

## 2018-04-09 NOTE — Progress Notes (Signed)
NEUROLOGY FOLLOW UP OFFICE NOTE  Victoria Bishop [id number]  HISTORY OF PRESENT ILLNESS: I had the pleasure of seeing Victoria Bishop in follow-up in the neurology clinic on 04/09/2018.  The patient was last seen 7 months ago for recurrent seizures and migraines. She is alone in the office today and is not clearly forthcoming/poor historian. She is taking Topamax 100mg  BID without side effects. She reports continued seizures which she thinks occurs when she misses a dose. She was told by her husband that she had a seizure in her sleep last 12/27. Prior to this was 3-4 months ago. She was reporting episodes of left face and arm tingling/numbness followed by drowsiness. She reports this last occurred 3 weeks ago, occurring around once a month. The headaches are not as bad. She is having a bad one now due to sinus issues. She denies any episodes of confusion, she denies any seizures at work. No falls.   HPI 05/11/2017: This is a 53 yo RH woman presenting for migraines, meningioma resection, and seizures. She recently moved from [place], records from her Neurologist in [place], [place] were reviewed. She started having seizures in July 2000 while still living in [place]. At that time she had eclampsia during delivery, she had pain radiating to her head then lost awareness. She was admitted to the hospital for a month and had renal failure requiring dialysis. Per report, 3 days later, she had seizures. There is an MRI brain report from July 2000 was concerning for PRES, reporting abnormal foci of signal intensity involving the cortical and subcortical regions of the posterior parietal and occipital lobes in a fairly symmetrical fashion. The largest area of abnormal signal involves the left posterior parietal lobe. Initially she was having episodes of loss of consciousness with convulsions. She was initially started on Tegretol and Dilantin immediately post-partum, and had been very well controlled on  carbamazepine XR for several years. There is an EEG report from 11/2005 quoted from [facility] reporting "epileptiform activity generalized, although there was not significant associated clinical motor activity." She had an EEG in [place] in 04/2006 reporting occasional sharp and slow wave complexes that appear epileptiform over the left anterior temporal regions. They deny any convulsions since 2000. Since then, she has been having recurrent stereotyped episodes starting with left-sided numbness and tingling on the arm, leg, face, and left side of her tongue. The left side of her head "feels like it is going weak." She describes electric shocks. There is no jerking or twitching but she describes them as spasms, then she feels weaker on the left side after. She cannot concentrate when this occurs, she speaks but slows down a lot, keeps saying "uhm" for several minutes. Apparently she can still function and do things when this occurs. She is sleepy afterwards and wakes up feeling better, but her daughter notices it takes a couple of days to return to her normal baseline. There is no associated headaches. She has difficulty saying how often they are, sometimes occurring twice a week, other times every other week. Last episode was 1.5 weeks ago. When she has them at night, she has really bad dreams. Her husband thinks she is having nightmares but when she wakes up she feels different. She denies any olfactory/gustatory hallucinations, rising epigastric sensation. She has noticed some gaps in time, her daughter has to repeat things some times.   Records from [place] were reviewed. She was transitioned off carbamazepine to zonisamide in 2010 due to concerns for osteoporosis,  however had abdominal pain and stomach burning, and was switched to Levetiracetam. 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 She reports that headaches worsened after the surgery and was started on Topiramate in 2015. She feels this has helped with the headaches but not the seizures. She is taking Topiramate 50mg  BID. Higher doses in the past caused side effects. She reports stopping the Keppra last February 2018 because her mood swings were horrible and anxiety was heightened. This has increased seizures frequency, but the anxiety and mood swings are better. Her Zoloft dose was also increased. She takes sumatriptan around twice a week on average for the migraines. Sleep is good. She denies any dizziness, diplopia, dysarthria/dysphagia, neck/back pain, bowel/bladder dysfunction. They have noticed she has a cough that would not stop if she has mint or a drink. She  Diagnostic Data: 03/20/2016 EEG normal wake and sleep EEG MRI brain 09/02/2015: Stable to slightly decreased size of rounded, well-circumscribed, homogenously  enhancing, extradural right cerebellar hemisphere lesion, again most consistent with a meningioma; this measures approximately 1.2 x 1.2 x 1.3 cm (AP x ML x CC) today. No new lesions suspicious for meningioma are seen. 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 She had meningioma resection in 2013. Otherwise she had a normal birth and early development.  There is no history of febrile convulsions, CNS infections such as meningitis/encephalitis, significant traumatic brain injury, or family history of seizures.  Prior AEDs: Tegretol, Dilantin, Keppra, Zonisamide  PAST MEDICAL HISTORY: Past Medical History:  Diagnosis Date  . Anxiety   . Brain tumor ([place]) 2013  . Depression   . Epilepsy  ([place])   . GERD (gastroesophageal reflux disease)   . Meningitis 2013  . Migraines   . Seizures ([place])     MEDICATIONS: Current Outpatient Medications on File Prior to Visit  Medication Sig Dispense Refill  . acetaminophen (TYLENOL) 325 MG tablet Take by mouth.    . Benzocaine-Menthol (CEPACOL) 15-2.3 MG LOZG Use as directed 1 tablet in the mouth or throat as needed. 16 lozenge 0  . cyclobenzaprine (FLEXERIL) 10 MG tablet Take 1 tablet (10 mg total) by mouth 3 (three) times daily as needed for muscle spasms. 30 tablet 1  . hydrOXYzine (ATARAX/VISTARIL) 25 MG tablet Take 1 tablet (25 mg total) by mouth 3 (three) times daily as needed for anxiety or itching. 60 tablet 2  . naproxen (NAPROSYN) 500 MG tablet Take 1 tablet (500 mg total) by mouth 2 (two) times daily with a meal. 60 tablet 1  . ranitidine (ZANTAC) 150 MG capsule Take 1 capsule (150 mg total) by mouth 2 (two) times daily. 60 capsule 2  . sertraline (ZOLOFT) 50 MG tablet Take 3 tablets (150 mg total) by mouth at bedtime. 270 tablet 3  . sodium chloride (OCEAN) 0.65 % SOLN nasal spray Place 1 spray into both nostrils as needed for congestion. 60 mL 0  . SUMAtriptan (IMITREX) 100 MG tablet Take 1 tab by mouth at first sign of migraine-May repeat in 2 hrs-MAX 2 tabs/24 hrs 10 tablet 5  . topiramate (TOPAMAX) 50 MG  tablet Take 2 tablets (100 mg total) by mouth 2 (two) times daily. 120 tablet 6   No current facility-administered medications on file prior to visit.     ALLERGIES: No Known Allergies  FAMILY HISTORY: Family History  Problem Relation Age of Onset  . Hypertension Mother   . Hypertension Father   . Hyperlipidemia Father   . Anxiety disorder Daughter   . Hearing loss Son   . Anxiety disorder Son   . Other Daughter        vader syndrome  . Diabetes Neg Hx   . Cancer Neg Hx     SOCIAL HISTORY: Social History   Socioeconomic History  . Marital status: Married    Spouse name: Not on file  . Number of children:  Not on file  . Years of education: Not on file  . Highest education level: Not on file  Occupational History  . Occupation: food Metallurgist: [employer]  . Financial resource strain: Not on file  . Food insecurity:    Worry: Not on file    Inability: Not on file  . Transportation needs:    Medical: Not on file    Non-medical: Not on file  Tobacco Use  . Smoking status: Never Smoker  . Smokeless tobacco: Never Used  Substance and Sexual Activity  . Alcohol use: No  . Drug use: No  . Sexual activity: Yes    Birth control/protection: None  Lifestyle  . Physical activity:    Days per week: Not on file    Minutes per session: Not on file  . Stress: Not on file  Relationships  . Social connections:    Talks on phone: Not on file    Gets together: Not on file    Attends religious service: Not on file    Active member of club or organization: Not on file    Attends meetings of clubs or organizations: Not on file    Relationship status: Not on file  . Intimate partner violence:    Fear of current or ex partner: Not on file    Emotionally abused: Not on file    Physically abused: Not on file    Forced sexual activity: Not on file  Other Topics Concern  . Not on file  Social History Narrative   Originally from [place], non smoker and non drinker, lives with husband and a son; 3 children (2 daughters and 1 son), 1 daughter passed at age 70       Lives in 1 story home   9th grade education   Works with [employer]: Constitutional: No fevers, chills, or sweats, no generalized fatigue, change in appetite Eyes: No visual changes, double vision, eye pain Ear, nose and throat: No hearing loss, ear pain, nasal congestion, sore throat Cardiovascular: No chest pain, palpitations Respiratory:  No shortness of breath at rest or with exertion, wheezes GastrointestinaI: No nausea, vomiting, diarrhea, abdominal pain, fecal  incontinence Genitourinary:  No dysuria, urinary retention or frequency Musculoskeletal:  No neck pain, back pain Integumentary: No rash, pruritus, skin lesions Neurological: as above Psychiatric: No depression, insomnia, anxiety Endocrine: No palpitations, fatigue, diaphoresis, mood swings, change in appetite, change in weight, increased thirst Hematologic/Lymphatic:  No anemia, purpura, petechiae. Allergic/Immunologic: no itchy/runny eyes, nasal congestion, recent allergic reactions, rashes  PHYSICAL EXAM: Vitals:   04/09/18 1449  BP: 90/60  Pulse: 94  SpO2: 97%  General: No acute distress Head:  Normocephalic/atraumatic Neck: supple, no paraspinal tenderness, full range of motion Back: No paraspinal tenderness Heart: regular rate and rhythm Lungs: Clear to auscultation bilaterally. Vascular: No carotid bruits. Skin/Extremities: No rash, no edema Neurological Exam: Mental status: alert and oriented to person, place, and time, no dysarthria or aphasia, Fund of knowledge is appropriate.  Recent and remote memory are intact.  Attention and concentration are normal.    Able to name objects and repeat phrases. Cranial nerves: CN I: not tested CN II: pupils equal, round and reactive to light, visual fields intact CN III, IV, VI:  full range of motion, no nystagmus, no ptosis CN V: facial sensation intact CN VII: upper and lower face symmetric CN VIII: hearing intact to conversation Bulk & Tone: normal, no fasciculations. Motor: 5/5 throughout with no pronator drift. Sensation: intact to light touch.  Romberg test negative Deep Tendon Reflexes: +2 throughout Plantar responses: downgoing bilaterally Cerebellar: no incoordination on finger to nose testing Gait: narrow-based and steady, able to tandem walk adequately. Tremor: none  IMPRESSION: This is a 53 yo RH woman with a history of migraines, meningioma resection, and seizures suggestive of focal seizures without impairment  of awareness, possibly arising from the right hemisphere, describing left-sided numbness and post-ictal weakness on the left side. However,a prior EEG reported left anterior temporal epileptiform discharges. 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 She is taking Topamax 100mg  BID with continued report of seizures, both nocturnal (unclear if convulsive) and focal seizures in the daytime. Increase Topamax to 150mg  BID. She reports the headaches are better. She is aware of [place] driving laws to stop driving  after a seizure with loss of awareness, until 6 months seizure-free. She will follow-up in 6 months and knows to call for any changes.   Thank you for allowing me to participate in her care.  Please do not hesitate to call for any questions or concerns.  The duration of this appointment visit was 25 minutes of face-to-face time with the patient.  Greater than 50% of this time was spent in counseling, explanation of diagnosis, planning of further management, and coordination of care.   Ellouise Newer, M.D.   CC: Dr. Jonni Sanger

## 2018-04-17 ENCOUNTER — Encounter: Payer: Self-pay | Admitting: Neurology

## 2018-04-24 ENCOUNTER — Encounter: Payer: Self-pay | Admitting: Neurology

## 2018-04-29 MED FILL — TOPIRAMATE 50 MG TABLET: 50 | 30 days supply | Qty: 120 | Fill #6

## 2018-05-24 MED FILL — TOPIRAMATE 50 MG TABLET: 50 | 30 days supply | Qty: 120 | Fill #2

## 2018-05-27 ENCOUNTER — Telehealth: Payer: Self-pay

## 2018-05-27 DIAGNOSIS — G43009 Migraine without aura, not intractable, without status migrainosus: Secondary | ICD-10-CM

## 2018-05-27 DIAGNOSIS — G40209 Localization-related (focal) (partial) symptomatic epilepsy and epileptic syndromes with complex partial seizures, not intractable, without status epilepticus: Secondary | ICD-10-CM

## 2018-05-27 MED ORDER — TOPIRAMATE 50 MG PO TABS: ORAL_TABLET | ORAL | 11 refills | Status: DC

## 2018-05-27 NOTE — Telephone Encounter (Signed)
pls see mychart exchange

## 2018-05-30 ENCOUNTER — Encounter: Payer: Self-pay | Admitting: Family Medicine

## 2018-06-17 ENCOUNTER — Ambulatory Visit: Payer: 59 | Admitting: Family Medicine

## 2018-06-18 ENCOUNTER — Other Ambulatory Visit: Payer: Self-pay | Admitting: Neurology

## 2018-06-18 DIAGNOSIS — G40209 Localization-related (focal) (partial) symptomatic epilepsy and epileptic syndromes with complex partial seizures, not intractable, without status epilepticus: Secondary | ICD-10-CM

## 2018-06-18 MED FILL — TOPIRAMATE 50 MG TABLET: 50 | 30 days supply | Qty: 180 | Fill #0

## 2018-06-18 MED FILL — SUMATRIPTAN SUCC 100 MG TAB: 100 | 30 days supply | Qty: 10 | Fill #0

## 2018-06-18 MED FILL — SERTRALINE HCL 50 MG TABLET: 50 | 90 days supply | Qty: 270 | Fill #0

## 2018-07-25 MED FILL — TOPIRAMATE 50 MG TABLET: 50 | 30 days supply | Qty: 180 | Fill #1

## 2018-09-06 MED FILL — SUMAtriptan SUCCINATE 100 M: 100 | 30 days supply | Qty: 10 | Fill #0

## 2018-09-06 MED FILL — TOPIRAMATE 50 MG TABLET: 50 | 30 days supply | Qty: 180 | Fill #0

## 2018-09-06 MED FILL — SERTRALINE HCL 50 MG TABLET: 50 | 90 days supply | Qty: 270 | Fill #0

## 2018-10-14 MED FILL — TOPIRAMATE 50 MG TABLET: 50 | 90 days supply | Qty: 540 | Fill #0

## 2018-10-25 ENCOUNTER — Ambulatory Visit: Payer: 59 | Admitting: Family Medicine

## 2018-11-08 ENCOUNTER — Ambulatory Visit: Payer: 59 | Admitting: Family Medicine

## 2018-11-08 ENCOUNTER — Other Ambulatory Visit: Payer: Self-pay

## 2018-11-08 ENCOUNTER — Ambulatory Visit: Payer: 59 | Admitting: Neurology

## 2018-11-08 ENCOUNTER — Encounter: Payer: Self-pay | Admitting: Family Medicine

## 2018-11-08 VITALS — BP 118/74 | HR 71 | Temp 98.0°F | Ht 62.0 in | Wt 148.0 lb

## 2018-11-08 DIAGNOSIS — M79671 Pain in right foot: Secondary | ICD-10-CM | POA: Diagnosis not present

## 2018-11-08 DIAGNOSIS — E782 Mixed hyperlipidemia: Secondary | ICD-10-CM

## 2018-11-08 DIAGNOSIS — F339 Major depressive disorder, recurrent, unspecified: Secondary | ICD-10-CM

## 2018-11-08 DIAGNOSIS — Z1239 Encounter for other screening for malignant neoplasm of breast: Secondary | ICD-10-CM | POA: Diagnosis not present

## 2018-11-08 DIAGNOSIS — Z23 Encounter for immunization: Secondary | ICD-10-CM | POA: Diagnosis not present

## 2018-11-08 LAB — COMPREHENSIVE METABOLIC PANEL
ALT: 12 U/L (ref 0–35)
AST: 18 U/L (ref 0–37)
Albumin: 4.3 g/dL (ref 3.5–5.2)
Alkaline Phosphatase: 71 U/L (ref 39–117)
BUN: 13 mg/dL (ref 6–23)
CO2: 25 mEq/L (ref 19–32)
Calcium: 9.5 mg/dL (ref 8.4–10.5)
Chloride: 107 mEq/L (ref 96–112)
Creatinine, Ser: 0.77 mg/dL (ref 0.40–1.20)
GFR: 77.88 mL/min (ref >60.00)
Glucose, Bld: 81 mg/dL (ref 70–99)
Potassium: 3.5 mEq/L (ref 3.5–5.1)
Sodium: 139 mEq/L (ref 135–145)
Total Bilirubin: 0.4 mg/dL (ref 0.2–1.2)
Total Protein: 7 g/dL (ref 6.0–8.3)

## 2018-11-08 LAB — LIPID PANEL
Cholesterol: 225 mg/dL — ABNORMAL HIGH (ref 0–200)
HDL: 51.7 mg/dL (ref >39.00)
LDL Cholesterol: 154 mg/dL — ABNORMAL HIGH (ref 0–99)
NonHDL: 173.71
Total CHOL/HDL Ratio: 4
Triglycerides: 101 mg/dL (ref 0.0–149.0)
VLDL: 20.2 mg/dL (ref 0.0–40.0)

## 2018-11-08 MED ORDER — HYDROXYZINE HCL 25 MG PO TABS: 25.0000 mg | ORAL_TABLET | Freq: Three times a day (TID) | ORAL | 2 refills | Status: DC | PRN

## 2018-11-08 NOTE — Patient Instructions (Addendum)
Please return in January for your complete physical.  Please stop drinking sweet tea; this will help you with your weight and cholesterol.  Please schedule your mammogram. I've placed a referral.   Use the hydroxyzine as needed for your anxiety.  Use Naproxen as needed for heel pain.  Ice your heel and shoulder as needed.   Today you were given your flu vaccination.   If you have any questions or concerns, please don't hesitate to send me a message via MyChart or call the office at [phone]. Thank you for visiting with [place] today! It's our pleasure caring for you.

## 2018-11-08 NOTE — Progress Notes (Signed)
Subjective  CC:  Chief Complaint  Patient presents with  . Cholesterol Check  . Right Foot Pain    Heel pain x1+ month  . Depression  . Anxiety    HPI: Victoria Bishop is a 53 y.o. female who presents to the office today to address the problems listed above in the chief complaint.  HLD: was to start statin in January but never did; instead worked on diet. Eating better but still daily sweet tea. Says she would start statin if it is still up/indicated.  Lab Results  Component Value Date   CHOL 264 (H) 03/14/2018   Lab Results  Component Value Date   HDL 46.30 03/14/2018   No results found for: [facility] Lab Results  Component Value Date   TRIG 310.0 (H) 03/14/2018   Lab Results  Component Value Date   CHOLHDL 6 03/14/2018   Lab Results  Component Value Date   LDLDIRECT 173.0 03/14/2018    Depression with anxiety f/u: mood is mostly well controlled but does get anxious at times. Never started /tried the hydroxyzine for this as we discussed at her last visit. No panic sxs. Doing well at home and work.   Due for mammogram.   C/o intermittent right heel pain;right on the bottom of the heel in one spot and worse with prolonged standing at work if wearing her sketchers.  No posterior pain. No swelling. No injury. No ankle pain. No mid foot pain.   Assessment  1. Mixed hyperlipidemia   2. Major depression, recurrent, chronic ([place])   3. Breast cancer screening   4. Pain of right heel   5. Need for immunization against influenza      Plan   HLD:  Likely will need statin- recheck nonfasting today. Stop sweet tea. Start statin if needed.   Mood:doing well; counseled on trial of prn atarax if needed. Continue zoloft.   HM: flu and mammogram ordered.   Heel pain: seems aching due to prolonged standing on hard surfaces and due to shoes. Ice, nsaids and heel pad if needed.   Follow up: jan for cpe  Visit date not found  Orders Placed This Encounter  Procedures  .  MM DIGITAL SCREENING BILATERAL  . Flu Vaccine QUAD 36+ mos IM  . Comprehensive metabolic panel  . Lipid panel   Meds ordered this encounter  Medications  . hydrOXYzine (ATARAX/VISTARIL) 25 MG tablet    Sig: Take 1 tablet (25 mg total) by mouth 3 (three) times daily as needed for anxiety or itching.    Dispense:  60 tablet    Refill:  2      I reviewed the patients updated PMH, FH, and SocHx.    Patient Active Problem List   Diagnosis Date Noted  . Chronic right-sided low back pain without sciatica 02/12/2017  . Lipoma of torso 02/12/2017  . Meningioma ([place]) 07/07/2016  . Major depression, recurrent, chronic ([place]) 06/23/2016  . Soft tissue mass 06/23/2016  . 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 Migraine without status migrainosus, not intractable 10/01/2015  . Helicobacter pylori (H. pylori) infection 08/17/2015  . Gastroesophageal reflux disease with esophagitis 08/13/2015  . Pterygium 08/23/2009   Current Meds  Medication Sig  . acetaminophen (TYLENOL) 325 MG tablet Take by mouth.  . Benzocaine-Menthol (CEPACOL) 15-2.3 MG LOZG Use as directed 1 tablet in the mouth or throat as needed.  . cyclobenzaprine (FLEXERIL) 10 MG tablet Take 1 tablet (10 mg total) by mouth 3 (three) times  daily as needed for muscle spasms.  . hydrOXYzine (ATARAX/VISTARIL) 25 MG tablet Take 1 tablet (25 mg total) by mouth 3 (three) times daily as needed for anxiety or itching.  . naproxen (NAPROSYN) 500 MG tablet Take 1 tablet (500 mg total) by mouth 2 (two) times daily with a meal.  . ranitidine (ZANTAC) 150 MG capsule Take 1 capsule (150 mg total) by mouth 2 (two) times daily.  . sertraline (ZOLOFT) 50 MG tablet Take 3 tablets (150 mg total) by mouth at bedtime.  . sodium chloride (OCEAN) 0.65 % SOLN nasal spray Place 1 spray into both nostrils as needed for congestion.  . SUMAtriptan (IMITREX) 100 MG tablet TAKE 1 TABLET BY MOUTH AT FIRST SIGN OF MIGRAINE-MAY REPEAT IN 2 HRS-MAX 2 TABS/24 HRS  .  topiramate (TOPAMAX) 50 MG tablet Take 3 tablets twice a day  . [DISCONTINUED] hydrOXYzine (ATARAX/VISTARIL) 25 MG tablet Take 1 tablet (25 mg total) by mouth 3 (three) times daily as needed for anxiety or itching.    Allergies: Patient has No Known Allergies. Family History: Patient family history includes Anxiety disorder in her daughter and son; Hearing loss in her son; Hyperlipidemia in her father; Hypertension in her father and mother; Other in her daughter. Social History:  Patient  reports that she has never smoked. She has never used smokeless tobacco. She reports that she does not drink alcohol or use drugs.  Review of Systems: Constitutional: Negative for fever malaise or anorexia Cardiovascular: negative for chest pain Respiratory: negative for SOB or persistent cough Gastrointestinal: negative for abdominal pain  Objective  Vitals: BP 118/74 (BP Location: Left Arm, Patient Position: Sitting, Cuff Size: Normal)   Pulse 71   Temp 98 F (36.7 C)   Ht 5\' 2"  (1.575 m)   Wt 148 lb (67.1 kg)   LMP 07/12/2014 (LMP Unknown)   SpO2 97%   BMI 27.07 kg/m  General: no acute distress , A&Ox3 HEENT: PEERL, conjunctiva normal, Oropharynx moist,neck is supple Cardiovascular:  RRR without murmur or gallop.  Respiratory:  Good breath sounds bilaterally, CTAB with normal respiratory effort Skin:  Warm, no rashes Left foot: normal exam     Commons side effects, risks, benefits, and alternatives for medications and treatment plan prescribed today were discussed, and the patient expressed understanding of the given instructions. Patient is instructed to call or message via MyChart if he/she has any questions or concerns regarding our treatment plan. No barriers to understanding were identified. We discussed Red Flag symptoms and signs in detail. Patient expressed understanding regarding what to do in case of urgent or emergency type symptoms.   Medication list was reconciled, printed and  provided to the patient in AVS. Patient instructions and summary information was reviewed with the patient as documented in the AVS. This note was prepared with assistance of Dragon voice recognition software. Occasional wrong-word or sound-a-like substitutions may have occurred due to the inherent limitations of voice recognition software

## 2018-11-12 MED FILL — hydrOXYzine HCL 25 MG TABS: 25 | 20 days supply | Qty: 60 | Fill #1

## 2018-11-21 ENCOUNTER — Other Ambulatory Visit: Payer: Self-pay

## 2018-11-21 ENCOUNTER — Telehealth (INDEPENDENT_AMBULATORY_CARE_PROVIDER_SITE_OTHER): Payer: 59 | Admitting: Neurology

## 2018-11-21 ENCOUNTER — Encounter: Payer: Self-pay | Admitting: Neurology

## 2018-11-21 VITALS — Ht 62.0 in | Wt 147.0 lb

## 2018-11-21 DIAGNOSIS — G43009 Migraine without aura, not intractable, without status migrainosus: Secondary | ICD-10-CM

## 2018-11-21 DIAGNOSIS — G40209 Localization-related (focal) (partial) symptomatic epilepsy and epileptic syndromes with complex partial seizures, not intractable, without status epilepticus: Secondary | ICD-10-CM

## 2018-11-21 MED ORDER — TOPIRAMATE 200 MG PO TABS: 200.0000 mg | ORAL_TABLET | Freq: Two times a day (BID) | ORAL | 3 refills | Status: DC

## 2018-11-21 MED ORDER — SUMATRIPTAN SUCCINATE 100 MG PO TABS: ORAL_TABLET | ORAL | 11 refills | Status: DC

## 2018-11-21 MED FILL — TOPIRAMATE 200 MG TABLET: 200 | 90 days supply | Qty: 180 | Fill #0

## 2018-11-21 MED FILL — SUMAtriptan SUCCINATE 100 M: 100 | 30 days supply | Qty: 10 | Fill #0

## 2018-11-21 NOTE — Progress Notes (Signed)
Virtual Visit via Video Note The purpose of this virtual visit is to provide medical care while limiting exposure to the novel coronavirus.    Consent was obtained for video visit:  Yes.   Answered questions that patient had about telehealth interaction:  Yes.   I discussed the limitations, risks, security and privacy concerns of performing an evaluation and management service by telemedicine. I also discussed with the patient that there may be a patient responsible charge related to this service. The patient expressed understanding and agreed to proceed.  Pt location: Home Physician Location: office Name of referring provider:  Leamon Arnt, MD I connected with Victoria Bishop at patients initiation/request on 11/21/2018 at  8:30 AM EDT by video enabled telemedicine application and verified that I am speaking with the correct person using two identifiers. Pt MRN:  [id number] Pt DOB:  1966/01/01 Video Participants:  Victoria Bishop   History of Present Illness:  The patient was seen as a virtual video visit on 11/21/2018. She was last seen 7 months ago for recurrent seizures and migraines. She is alone today. Since her last visit, she reports 2 small seizures on 6/14 and 8/12 where her husband said she was not responding and she felt very tired and sleepy. She had a bigger seizure with loss of consciousness while alone at a restaurant in [place]. There is not much information available about the event, she was told she was on the floor for 3 minutes. She feels weaker on her left side after the seizures. She was very sleep deprived and not sure if she missed her Topiramate but thinks she is doing good taking it. She is on Topiramate 150mg  BID without side effects. The migraines are better with less stress since school has been online ("kids were stressing me out"). She takes Imitrex around twice a month, last intake was 3 weeks ago. She has occasional dizziness. No diplopia, focal  numbness/tingling/weakness, olfactory/gustatory hallucinations. She reports her drivers license has been revoked.    History on Initial Assessment 05/11/2017: This is a 53 yo RH woman presenting for migraines, meningioma resection, and seizures. She recently moved from [place], records from her Neurologist in [place], [place] were reviewed. She started having seizures in July 2000 while still living in [place]. At that time she had eclampsia during delivery, she had pain radiating to her head then lost awareness. She was admitted to the hospital for a month and had renal failure requiring dialysis. Per report, 3 days later, she had seizures. There is an MRI brain report from July 2000 was concerning for PRES, reporting abnormal foci of signal intensity involving the cortical and subcortical regions of the posterior parietal and occipital lobes in a fairly symmetrical fashion. The largest area of abnormal signal involves the left posterior parietal lobe. Initially she was having episodes of loss of consciousness with convulsions. She was initially started on Tegretol and Dilantin immediately post-partum, and had been very well controlled on carbamazepine XR for several years. There is an EEG report from 11/2005 quoted from [facility] reporting "epileptiform activity generalized, although there was not significant associated clinical motor activity." She had an EEG in [place] in 04/2006 reporting occasional sharp and slow wave complexes that appear epileptiform over the left anterior temporal regions. They deny any convulsions since 2000. Since then, she has been having recurrent stereotyped episodes starting with left-sided numbness and tingling on the arm, leg, face, and left side of her tongue. The left side of her head "  feels like it is going weak." She describes electric shocks. There is no jerking or twitching but she describes them as spasms, then she feels weaker on the left side after. She cannot concentrate when this  occurs, she speaks but slows down a lot, keeps saying "uhm" for several minutes. Apparently she can still function and do things when this occurs. She is sleepy afterwards and wakes up feeling better, but her daughter notices it takes a couple of days to return to her normal baseline. There is no associated headaches. She has difficulty saying how often they are, sometimes occurring twice a week, other times every other week. Last episode was 1.5 weeks ago. When she has them at night, she has really bad dreams. Her husband thinks she is having nightmares but when she wakes up she feels different. She denies any olfactory/gustatory hallucinations, rising epigastric sensation. She has noticed some gaps in time, her daughter has to repeat things some times.   Records from [place] were reviewed. She was transitioned off carbamazepine to zonisamide in 2010 due to concerns for osteoporosis, however had abdominal pain and stomach burning, and was switched to Levetiracetam. 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 She reports that headaches worsened after the surgery and was started on Topiramate in 2015. She feels this has helped with the headaches but not the seizures. She is taking Topiramate 50mg  BID. Higher doses in the past caused side effects. She reports stopping the Keppra last February 2018 because her mood swings were horrible and anxiety was heightened. This has increased seizures frequency, but the anxiety and mood swings are better. Her Zoloft dose was also increased. She takes sumatriptan around twice a week on average for the migraines. Sleep is good. She denies any dizziness, diplopia, dysarthria/dysphagia, neck/back pain, bowel/bladder dysfunction. They have noticed she has a cough that would not stop if she has mint  or a drink. She  Diagnostic Data: 03/20/2016 EEG normal wake and sleep EEG MRI brain 09/02/2015: Stable to slightly decreased size of rounded, well-circumscribed, homogenously  enhancing, extradural right cerebellar hemisphere lesion, again most consistent with a meningioma; this measures approximately 1.2 x 1.2 x 1.3 cm (AP x ML x CC) today. No new lesions suspicious for meningioma are seen. 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 She had meningioma resection in 2013. Otherwise she had a normal birth and early development.  There is no history of febrile convulsions, CNS infections such as meningitis/encephalitis, significant traumatic brain injury, or family history of seizures.  Prior AEDs: Tegretol, Dilantin, Keppra, Zonisamide    Current Outpatient Medications on File Prior to Visit  Medication Sig Dispense Refill   acetaminophen (TYLENOL) 325 MG tablet Take by mouth every 6 (six) hours as needed.      Benzocaine-Menthol (CEPACOL) 15-2.3 MG LOZG Use as directed 1 tablet in the mouth or throat as needed. 16 lozenge 0   cyclobenzaprine (FLEXERIL) 10 MG tablet Take 1 tablet (10 mg total) by mouth 3 (three) times daily as needed for muscle spasms. 30 tablet 1   hydrOXYzine (ATARAX/VISTARIL) 25 MG tablet Take 1  tablet (25 mg total) by mouth 3 (three) times daily as needed for anxiety or itching. 60 tablet 2   naproxen (NAPROSYN) 500 MG tablet Take 1 tablet (500 mg total) by mouth 2 (two) times daily with a meal. 60 tablet 1   ranitidine (ZANTAC) 150 MG capsule Take 1 capsule (150 mg total) by mouth 2 (two) times daily. 60 capsule 2   sertraline (ZOLOFT) 50 MG tablet Take 3 tablets (150 mg total) by mouth at bedtime. 270 tablet  3   sodium chloride (OCEAN) 0.65 % SOLN nasal spray Place 1 spray into both nostrils as needed for congestion. 60 mL 0   SUMAtriptan (IMITREX) 100 MG tablet TAKE 1 TABLET BY MOUTH AT FIRST SIGN OF MIGRAINE-MAY REPEAT IN 2 HRS-MAX 2 TABS/24 HRS 10 tablet 5   [DISCONTINUED] topiramate (TOPAMAX) 50 MG tablet Take 3 tablets twice a day 180 tablet 11   No current facility-administered medications on file prior to visit.      Observations/Objective:   Vitals:   11/21/18 0819  Weight: 147 lb (66.7 kg)  Height: 5\' 2"  (1.575 m)   GEN:  The patient appears stated age and is in NAD.  Neurological examination: Patient is awake, alert, oriented x 3. No aphasia or dysarthria. Intact fluency and comprehension. Remote and recent memory intact. Able to name and repeat. Cranial nerves: Extraocular movements intact with no nystagmus. No facial asymmetry. Motor: moves all extremities symmetrically, at least anti-gravity x 4.    Assessment and Plan:   This is a 53 yo RH woman with a history of migraines, meningioma resection, and seizures suggestive of focal seizures without impairment of awareness, possibly arising from the right hemisphere, describing left-sided numbness and post-ictal weakness on the left side. However,a prior EEG reported left anterior temporal epileptiform discharges. 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 She had a seizure with loss of consciousness last 09/18/2018 and 2 focal seizures with loss of awareness since her last visit. We discussed increasing Topiramate to 200mg  BID or adding another AED, she would like to increase TPX first. Refills for Imitrex sent. Her license has been revoked, we discussed [place] driving laws, no driving until 6 months seizure-free. Follow-up in 6 months, she knows to call for any changes, continue seizure calendar.   Follow Up Instructions:   -I discussed the assessment and treatment plan with the patient.  The patient was provided an opportunity to ask questions and all were answered. The patient agreed with the plan and demonstrated an understanding of the instructions.   The patient was advised to call back or seek an in-person evaluation if the symptoms worsen or if the condition fails to improve as anticipated.     Cameron Sprang, MD

## 2018-12-02 MED FILL — SUMAtriptan SUCCINATE 100 M: 100 | 30 days supply | Qty: 10 | Fill #0

## 2018-12-02 MED FILL — TOPIRAMATE 200 MG TABLET: 200 | 90 days supply | Qty: 180 | Fill #0

## 2018-12-26 MED FILL — SERTRALINE HCL 50 MG TABLET: 50 | 90 days supply | Qty: 270 | Fill #1

## 2019-01-07 ENCOUNTER — Other Ambulatory Visit: Payer: Self-pay | Admitting: *Deleted

## 2019-01-07 DIAGNOSIS — Z1231 Encounter for screening mammogram for malignant neoplasm of breast: Secondary | ICD-10-CM

## 2019-01-08 ENCOUNTER — Encounter: Payer: Self-pay | Admitting: Family Medicine

## 2019-01-08 ENCOUNTER — Other Ambulatory Visit: Payer: Self-pay | Admitting: *Deleted

## 2019-03-10 ENCOUNTER — Other Ambulatory Visit: Payer: Self-pay | Admitting: Family Medicine

## 2019-03-10 MED FILL — SUMAtriptan SUCCINATE 100 M: 100 | 30 days supply | Qty: 10 | Fill #1

## 2019-03-18 MED FILL — SERTRALINE HCL 50 MG TABLET: 50 | 90 days supply | Qty: 270 | Fill #0

## 2019-03-21 ENCOUNTER — Other Ambulatory Visit: Payer: Self-pay

## 2019-03-21 ENCOUNTER — Ambulatory Visit
Admission: RE | Admit: 2019-03-21 | Discharge: 2019-03-21 | Disposition: A | Discharge status: Home / Self Care | Payer: 59 | Source: Ambulatory Visit | Attending: Family Medicine | Admitting: Family Medicine

## 2019-03-21 DIAGNOSIS — Z1231 Encounter for screening mammogram for malignant neoplasm of breast: Secondary | ICD-10-CM

## 2019-03-21 IMAGING — MG DIGITAL SCREENING BILAT W/ CAD
4 series · 4 of 4 positions shown · non-contrast
Comparison: Previous exam(s).

CLINICAL DATA: Screening.

EXAM:
DIGITAL SCREENING BILATERAL MAMMOGRAM WITH CAD

[L MLO]
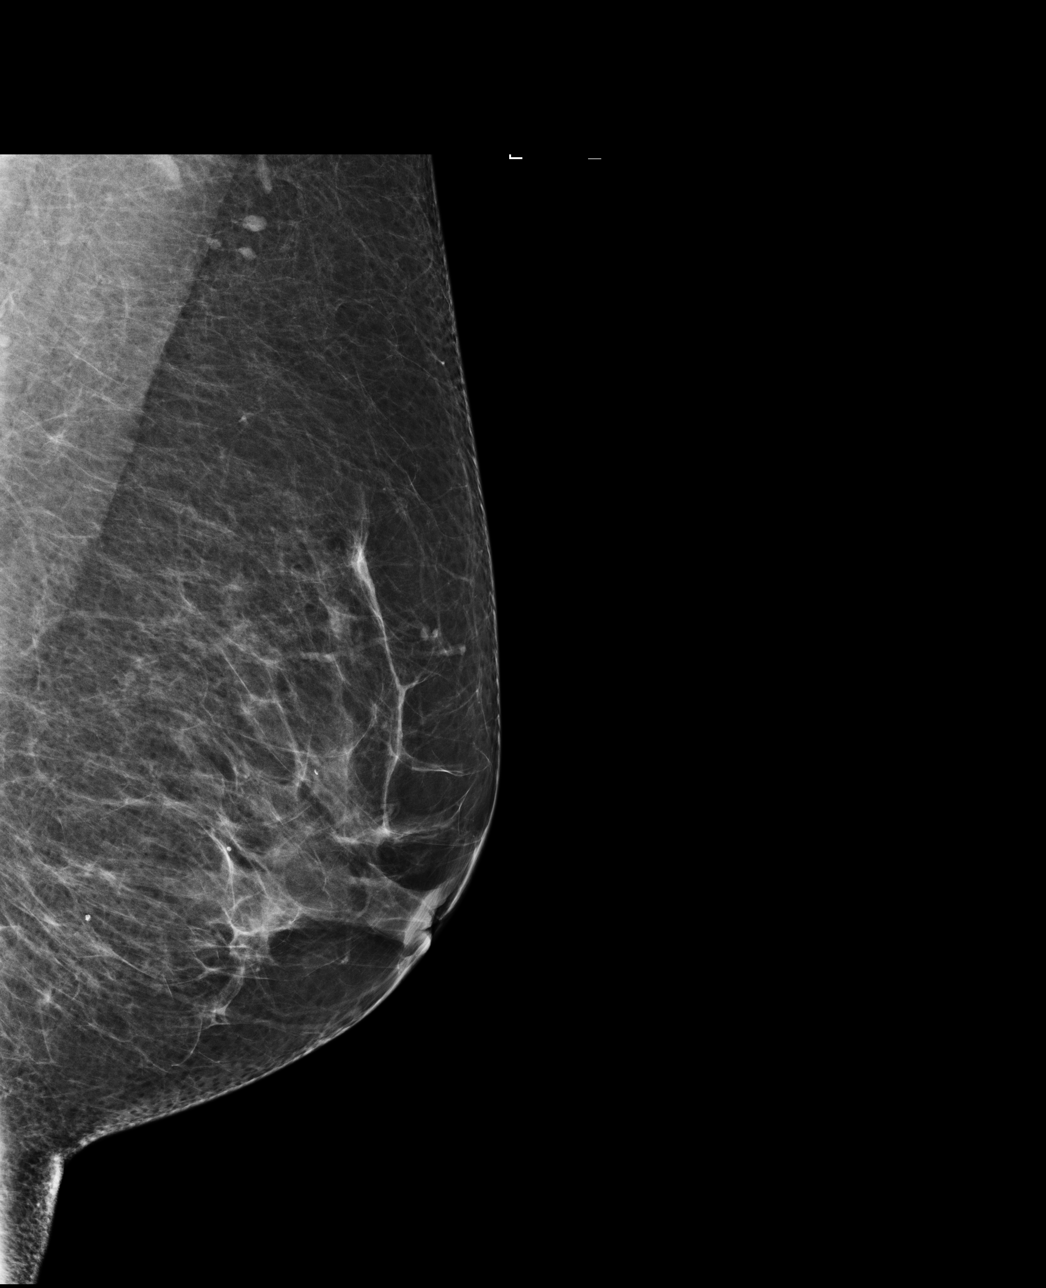

[R CC]
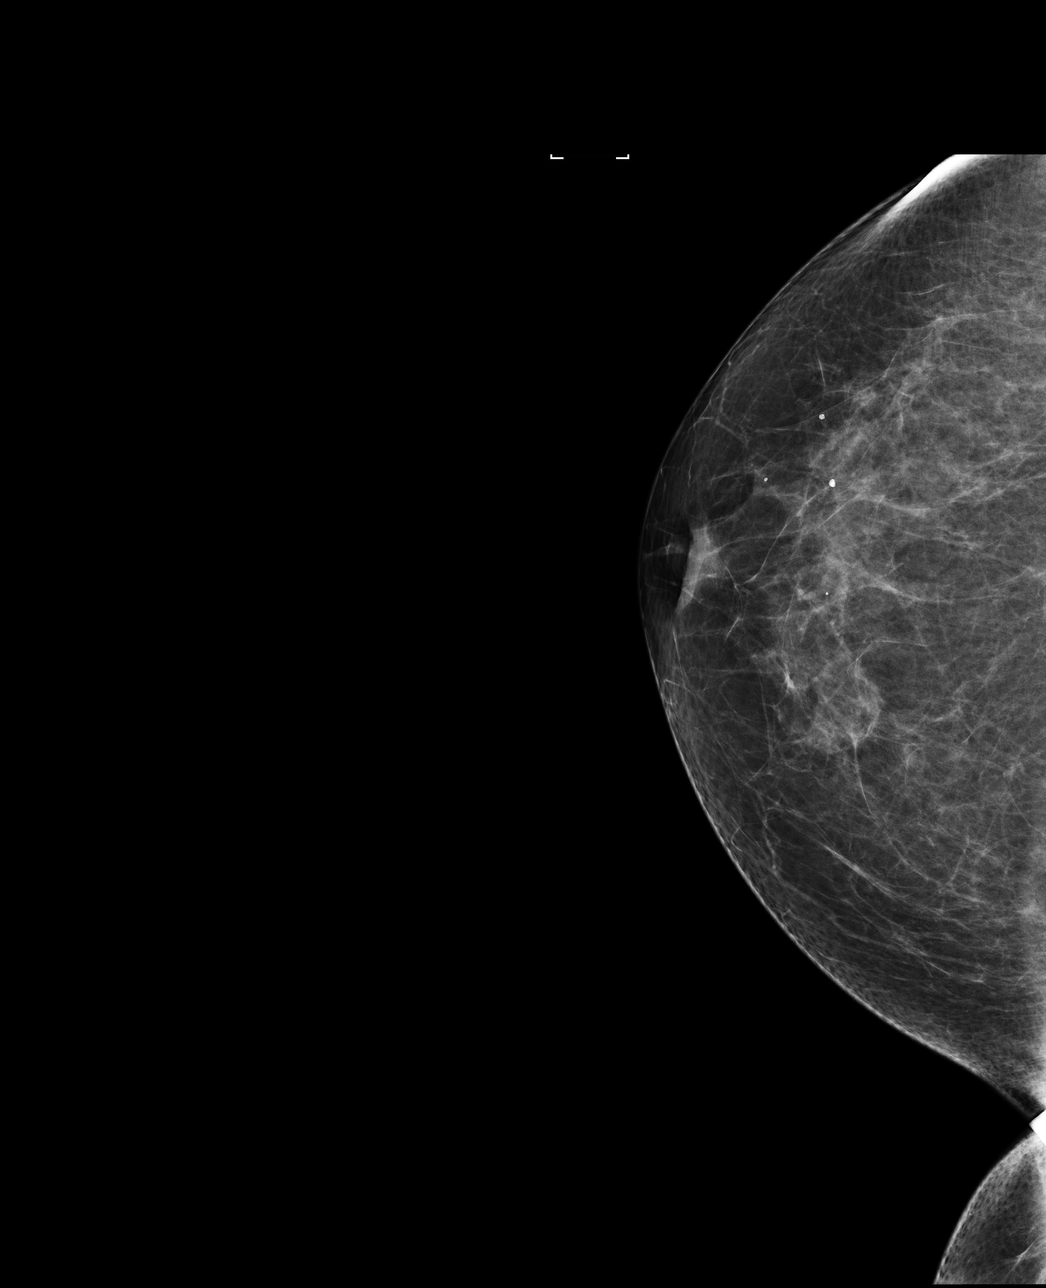

[R MLO]
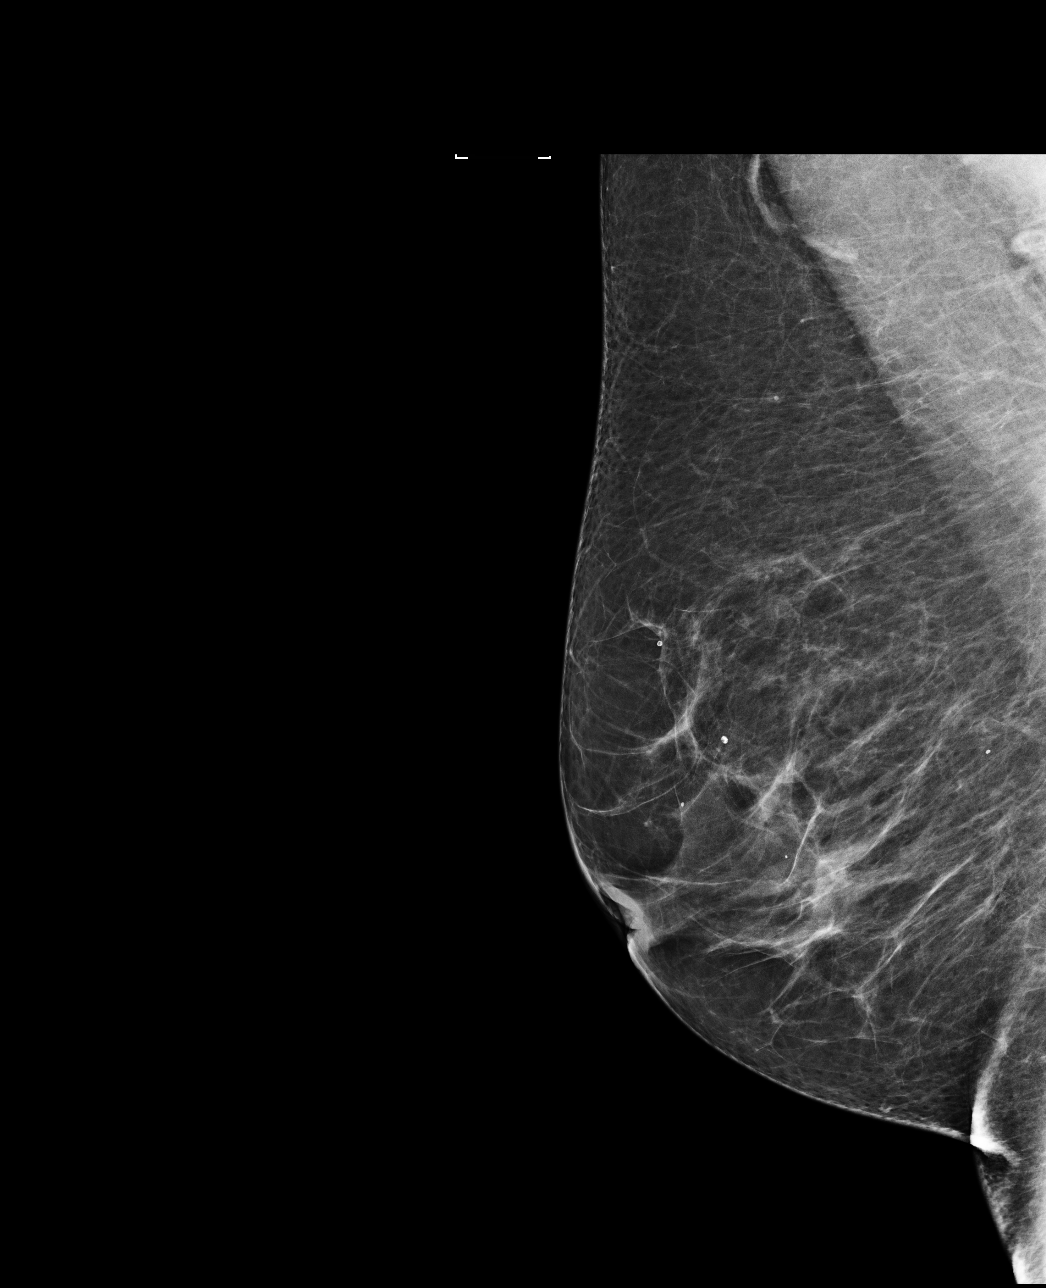

[L CC]
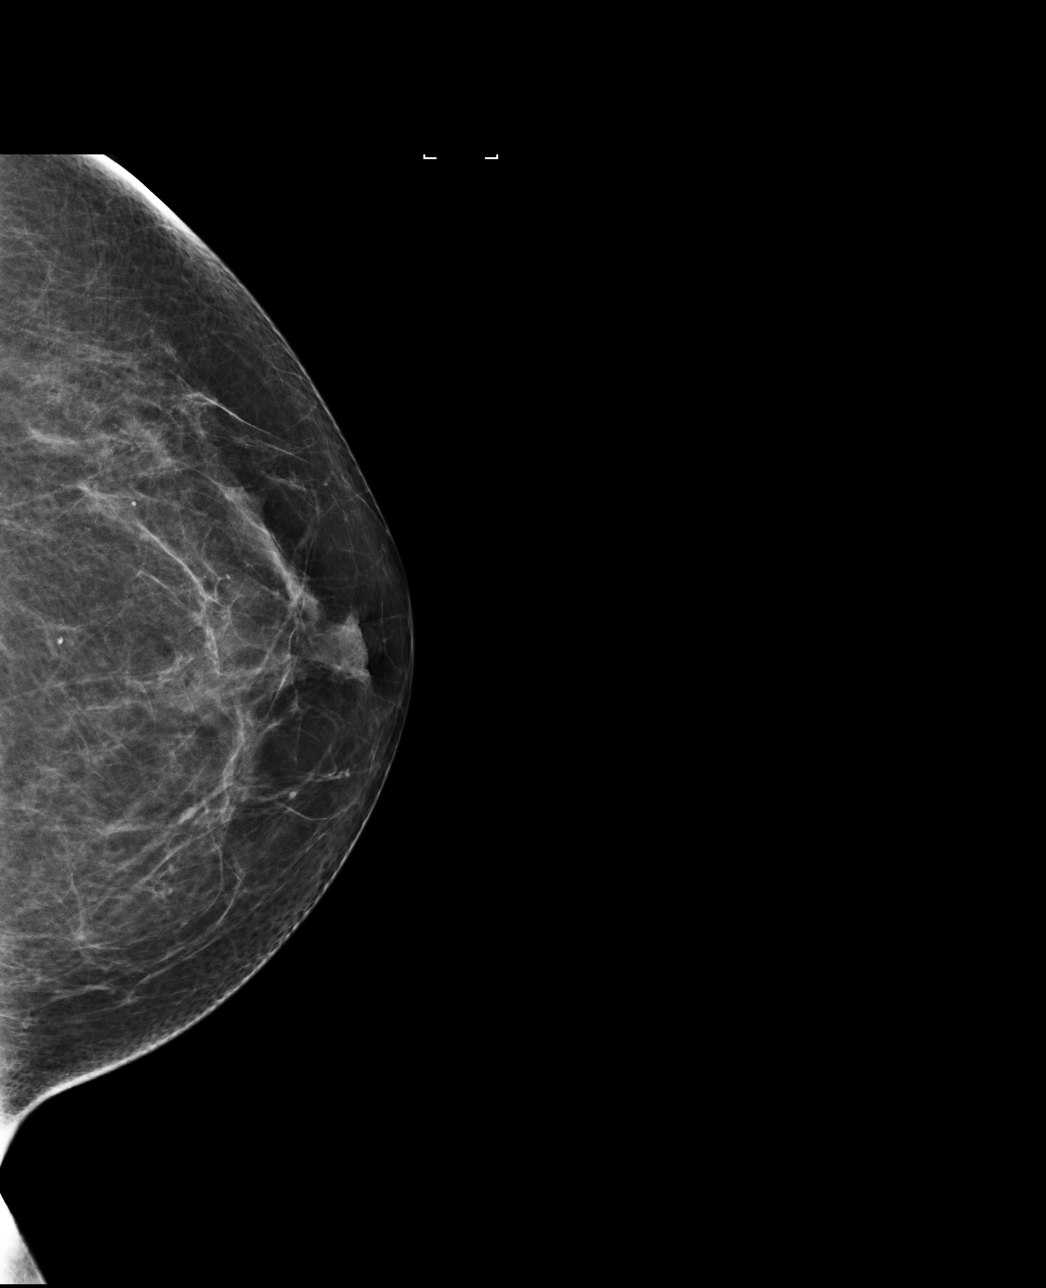

[4 of 4 positions shown; findings below may reference images not displayed]

ACR Breast Density Category b: There are scattered areas of
fibroglandular density.
FINDINGS: There are no findings suspicious for malignancy. Images were
processed with CAD.
IMPRESSION: No mammographic evidence of malignancy. A result letter of this
screening mammogram will be mailed directly to the patient.

RECOMMENDATION:
Screening mammogram in one year. (Code:[US])

BI-RADS CATEGORY  1: Negative.

## 2019-03-27 ENCOUNTER — Ambulatory Visit: Payer: 59 | Attending: Internal Medicine

## 2019-03-27 DIAGNOSIS — Z20822 Contact with and (suspected) exposure to covid-19: Secondary | ICD-10-CM

## 2019-03-28 LAB — NOVEL CORONAVIRUS, NAA: SARS-CoV-2, NAA: NOT DETECTED

## 2019-05-20 MED FILL — TOPIRAMATE 200 MG TABLET: 200 | 90 days supply | Qty: 180 | Fill #1

## 2019-05-20 MED FILL — SUMAtriptan SUCCINATE 100 M: 100 | 30 days supply | Qty: 10 | Fill #2

## 2019-05-23 ENCOUNTER — Ambulatory Visit: Payer: 59 | Attending: Internal Medicine

## 2019-05-23 DIAGNOSIS — Z23 Encounter for immunization: Secondary | ICD-10-CM

## 2019-05-23 NOTE — Progress Notes (Signed)
   Covid-19 Vaccination Clinic  Name:  Jd Waltz    MRN: [id number] DOB: 12/24/1965  05/23/2019  Ms. Merrihew was observed post Covid-19 immunization for 15 minutes without incident. She was provided with Vaccine Information Sheet and instruction to access the V-Safe system.   Ms. Shrewsbury was instructed to call 911 with any severe reactions post vaccine: Marland Kitchen Difficulty breathing  . Swelling of face and throat  . A fast heartbeat  . A bad rash all over body  . Dizziness and weakness   Immunizations Administered    Name Date Dose VIS Date Route   Pfizer COVID-19 Vaccine 05/23/2019  2:51 PM 0.3 mL 02/21/2019 Intramuscular   Manufacturer: [place]   Lot: [id number]   [place]: [id number]

## 2019-05-30 MED FILL — TOPIRAMATE 200 MG TABLET: 200 | 90 days supply | Qty: 180 | Fill #1

## 2019-05-30 MED FILL — SUMAtriptan SUCCINATE 100 M: 100 | 30 days supply | Qty: 10 | Fill #2

## 2019-06-11 ENCOUNTER — Other Ambulatory Visit: Payer: Self-pay

## 2019-06-11 ENCOUNTER — Ambulatory Visit: Payer: 59 | Admitting: Neurology

## 2019-06-11 ENCOUNTER — Other Ambulatory Visit: Payer: Self-pay | Admitting: Neurology

## 2019-06-11 ENCOUNTER — Encounter: Payer: Self-pay | Admitting: Neurology

## 2019-06-11 VITALS — BP 103/67 | HR 84 | Ht 62.0 in | Wt 155.0 lb

## 2019-06-11 DIAGNOSIS — D329 Benign neoplasm of meninges, unspecified: Secondary | ICD-10-CM

## 2019-06-11 DIAGNOSIS — G43009 Migraine without aura, not intractable, without status migrainosus: Secondary | ICD-10-CM | POA: Diagnosis not present

## 2019-06-11 DIAGNOSIS — G40209 Localization-related (focal) (partial) symptomatic epilepsy and epileptic syndromes with complex partial seizures, not intractable, without status epilepticus: Secondary | ICD-10-CM | POA: Diagnosis not present

## 2019-06-11 MED ORDER — TOPIRAMATE 50 MG PO TABS: ORAL_TABLET | ORAL | 11 refills | Status: DC

## 2019-06-11 MED ORDER — SUMATRIPTAN SUCCINATE 100 MG PO TABS: ORAL_TABLET | ORAL | 11 refills | Status: DC

## 2019-06-11 MED FILL — SERTRALINE HCL 50 MG TABLET: 50 | 90 days supply | Qty: 270 | Fill #1

## 2019-06-11 NOTE — Patient Instructions (Addendum)
1. Schedule MRI brain with and without contrast We have sent a referral to [facility] for your MRI and they will call you directly to schedule your appointment. They are located at [place]. If you need to contact them directly please call [phone].   2. Change Topamax to 50mg  tablets: take 3 tablets in AM, 4 tablets at bedtime  3. Refills for Imitrex sent to pharmacy  4. Follow-up in 6 months, call for any changes  Seizure Precautions: 1. If medication has been prescribed for you to prevent seizures, take it exactly as directed.  Do not stop taking the medicine without talking to your doctor first, even if you have not had a seizure in a long time.   2. Avoid activities in which a seizure would cause danger to yourself or to others.  Don't operate dangerous machinery, swim alone, or climb in high or dangerous places, such as on ladders, roofs, or girders.  Do not drive unless your doctor says you may.  3. If you have any warning that you may have a seizure, lay down in a safe place where you can't hurt yourself.    4.  No driving for 6 months from last seizure, as per [facility].   Please refer to the following link on the [facility] website for more information: http://[email or web address]/[email or web address]/drivingu.cfm   5.  Maintain good sleep hygiene. Avoid alcohol.  6.  Contact your doctor if you have any problems that may be related to the medicine you are taking.  7.  Call 911 and bring the patient back to the ED if:        A.  The seizure lasts longer than 5 minutes.       B.  The patient doesn't awaken shortly after the seizure  C.  The patient has new problems such as difficulty seeing, speaking or moving  D.  The patient was injured during the seizure  E.  The patient has a temperature over 102 F (39C)  F.  The patient vomited and now is having trouble breathing

## 2019-06-11 NOTE — Progress Notes (Signed)
NEUROLOGY FOLLOW UP OFFICE NOTE  Steva Mulanax [id number] 09/12/1965  HISTORY OF PRESENT ILLNESS: I had the pleasure of seeing Victoria Bishop in follow-up in the neurology clinic on 06/11/2019.  The patient was last seen 6 months ago for recurrent seizures and migraines. She is alone in the office today. On her last visit, she reported seizures in June, July, and August 2020. Topiramate was increased to 200mg  BID, she denies any further seizures since 10/2018, but reports the higher dose makes her sleepy. 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 She takes prn sumatriptan which works very well but makes her sleepy. She has been very tired. She reports sleep is good. She takes sertraline for mood, it also makes her sleepy so she takes 50mg  in AM, 100mg  in PM.   History on Initial Assessment 05/11/2017: This is a 54 yo RH woman presenting for migraines, meningioma resection, and seizures. She recently moved from [place], records from her Neurologist in [place], [place] were reviewed. She started having seizures in July 2000 while still living in [place]. At that time she had eclampsia during delivery, she had pain radiating to her head then lost awareness. She was admitted to the hospital for a month and had renal failure requiring dialysis. Per report, 3 days later, she had seizures. There is an MRI brain report from July 2000 was concerning for PRES, reporting abnormal foci of signal intensity involving the cortical and subcortical regions of the posterior parietal and occipital lobes in a fairly symmetrical fashion. The largest area of abnormal signal involves the left posterior parietal lobe. Initially she was having episodes of loss of consciousness with convulsions. She was initially started on Tegretol and Dilantin immediately post-partum, and had been very well controlled on carbamazepine XR for several years. There is an EEG report from  11/2005 quoted from [facility] reporting "epileptiform activity generalized, although there was not significant associated clinical motor activity." She had an EEG in [place] in 04/2006 reporting occasional sharp and slow wave complexes that appear epileptiform over the left anterior temporal regions. They deny any convulsions since 2000. Since then, she has been having recurrent stereotyped episodes starting with left-sided numbness and tingling on the arm, leg, face, and left side of her tongue. The left side of her head "feels like it is going weak." She describes electric shocks. There is no jerking or twitching but she describes them as spasms, then she feels weaker on the left side after. She cannot concentrate when this occurs, she speaks but slows down a lot, keeps saying "uhm" for several minutes. Apparently she can still function and do things when this occurs. She is sleepy afterwards and wakes up feeling better, but her daughter notices it takes a couple of days to return to her normal baseline. There is no associated headaches. She has difficulty saying how often they are, sometimes occurring twice a week, other times every other week. Last episode was 1.5 weeks ago. When she has them at night, she has really bad dreams. Her husband thinks she is having nightmares but when she wakes up she feels different. She denies any olfactory/gustatory hallucinations, rising epigastric sensation. She has noticed some gaps in time, her daughter has to repeat things some times.   Records from [place] were reviewed. She was transitioned off carbamazepine to zonisamide in 2010 due to concerns for osteoporosis, however had abdominal pain and stomach burning, and was switched to Levetiracetam. 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 She  reports that headaches worsened after the surgery and was started on Topiramate in 2015. She feels this has helped with the headaches but not the seizures. She is taking Topiramate 50mg  BID. Higher doses in the past caused side effects. She reports stopping the Keppra last February 2018 because her mood swings were horrible and anxiety was heightened. This has increased seizures frequency, but the anxiety and mood swings are better. Her Zoloft dose was also increased. She takes sumatriptan around twice a week on average for the migraines. Sleep is good. She denies any dizziness, diplopia, dysarthria/dysphagia, neck/back pain, bowel/bladder dysfunction. They have noticed she has a cough that would not stop if she has mint or a drink. She  Diagnostic Data: 03/20/2016 EEG normal wake and sleep EEG MRI brain 09/02/2015: Stable to slightly decreased size of rounded, well-circumscribed, homogenously  enhancing, extradural right cerebellar hemisphere lesion, again most consistent with a meningioma; this measures approximately 1.2 x 1.2 x 1.3 cm (AP x ML x CC) today. No new lesions suspicious for meningioma are seen. 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 She had meningioma resection in 2013. Otherwise she had a normal birth and early development.  There is no history of febrile convulsions, CNS infections such as meningitis/encephalitis, significant traumatic brain injury, or family history of seizures.  Prior AEDs: Tegretol, Dilantin, Keppra, Zonisamide   PAST MEDICAL HISTORY: Past Medical History:  Diagnosis Date  . Anxiety   . Brain tumor ([facility]) 2013  . Depression   . Epilepsy ([facility])   . GERD (gastroesophageal reflux disease)   .  Meningitis 2013  . Migraines   . Seizures ([facility])      Current Outpatient Medications on File Prior to Visit  Medication Sig Dispense Refill  . acetaminophen (TYLENOL) 325 MG tablet Take by mouth every 6 (six) hours as needed.     . Benzocaine-Menthol (CEPACOL) 15-2.3 MG LOZG Use as directed 1 tablet in the mouth or throat as needed. 16 lozenge 0  . cyclobenzaprine (FLEXERIL) 10 MG tablet Take 1 tablet (10 mg total) by mouth 3 (three) times daily as needed for muscle spasms. 30 tablet 1  . hydrOXYzine (ATARAX/VISTARIL) 25 MG tablet Take 1 tablet (25 mg total) by mouth 3 (three) times daily as needed for anxiety or itching. 60 tablet 2  . naproxen (NAPROSYN) 500 MG tablet Take 1 tablet (500 mg total) by mouth 2 (two) times daily with a meal. 60 tablet 1  . ranitidine (ZANTAC) 150 MG capsule Take 1 capsule (150 mg total) by mouth 2 (two) times daily. 60 capsule 2  . sertraline (ZOLOFT) 50 MG tablet TAKE 3 TABLETS BY MOUTH AT BEDTIME. 270 tablet 1  . sodium chloride (OCEAN) 0.65 % SOLN nasal spray Place 1 spray into both nostrils as needed for congestion. 60 mL 0  . SUMAtriptan (IMITREX) 100 MG tablet TAKE 1 TABLET BY MOUTH AT FIRST SIGN OF MIGRAINE-MAY REPEAT IN 2 HRS-MAX 2 TABS/24 HRS 10 tablet 11  . topiramate (TOPAMAX) 200 MG tablet Take 1 tablet (200 mg total) by  mouth 2 (two) times daily. 180 tablet 3   No current facility-administered medications on file prior to visit.    ALLERGIES: No Known Allergies  FAMILY HISTORY: Family History  Problem Relation Age of Onset  . Hypertension Mother   . Hypertension Father   . Hyperlipidemia Father   . Anxiety disorder Daughter   . Hearing loss Son   . Anxiety disorder Son   . Other Daughter        vader syndrome  . Diabetes Neg Hx   . Cancer Neg Hx     SOCIAL HISTORY: Social History   Socioeconomic History  . Marital status: Married    Spouse name: Not on file  . Number of children: Not on file  . Years of education: Not on file   . Highest education level: Not on file  Occupational History  . Occupation: food Metallurgist: [employer]  Tobacco Use  . Smoking status: Never Smoker  . Smokeless tobacco: Never Used  Substance and Sexual Activity  . Alcohol use: No  . Drug use: No  . Sexual activity: Yes    Birth control/protection: None  Other Topics Concern  . Not on file  Social History Narrative   Originally from [place], non smoker and non drinker, lives with husband and a son; 3 children (2 daughters and 1 son), 1 daughter passed at age 7       Lives in 1 story home   9th grade education   Works with [employer]   Social Determinants of Health   Financial Resource Strain:   . Difficulty of Paying Living Expenses:   Food Insecurity:   . Worried About Charity fundraiser in the Last Year:   . Arboriculturist in the Last Year:   Transportation Needs:   . Film/video editor (Medical):   Marland Kitchen Lack of Transportation (Non-Medical):   Physical Activity:   . Days of Exercise per Week:   . Minutes of Exercise per Session:   Stress:   . Feeling of Stress :   Social Connections:   . Frequency of Communication with Friends and Family:   . Frequency of Social Gatherings with Friends and Family:   . Attends Religious Services:   . Active Member of Clubs or Organizations:   . Attends Archivist Meetings:   Marland Kitchen Marital Status:   Intimate Partner Violence:   . Fear of Current or Ex-Partner:   . Emotionally Abused:   Marland Kitchen Physically Abused:   . Sexually Abused:     REVIEW OF SYSTEMS: Constitutional: No fevers, chills, or sweats, no generalized fatigue, change in appetite Eyes: No visual changes, double vision, eye pain Ear, nose and throat: No hearing loss, ear pain, nasal congestion, sore throat Cardiovascular: No chest pain, palpitations Respiratory:  No shortness of breath at rest or with exertion, wheezes GastrointestinaI: No nausea, vomiting, diarrhea, abdominal  pain, fecal incontinence Genitourinary:  No dysuria, urinary retention or frequency Musculoskeletal:  No neck pain, back pain Integumentary: No rash, pruritus, skin lesions Neurological: as above Psychiatric: No depression, insomnia, anxiety Endocrine: No palpitations, fatigue, diaphoresis, mood swings, change in appetite, change in weight, increased thirst Hematologic/Lymphatic:  No anemia, purpura, petechiae. Allergic/Immunologic: no itchy/runny eyes, nasal congestion, recent allergic reactions, rashes  PHYSICAL EXAM: Vitals:   06/11/19 1432  BP: 103/67  Pulse: 84  SpO2: 97%   General: No acute distress Head:  Normocephalic/atraumatic Skin/Extremities: No rash, no edema Neurological  Exam: alert and oriented to person, place, and time. No aphasia or dysarthria. Fund of knowledge is appropriate.  Recent and remote memory are intact.  Attention and concentration are normal.  Cranial nerves: Pupils equal, round, reactive to light.  Extraocular movements intact with no nystagmus. Visual fields full. No facial asymmetry.  Motor: Bulk and tone normal, muscle strength 5/5 throughout with no pronator drift. Finger to nose testing intact.  Gait narrow-based and steady, able to tandem walk adequately.  Romberg negative.   IMPRESSION: This is a 54 yo RH woman with a history of migraines, meningioma resection, and seizures suggestive of focal seizures without impairment of awareness, possibly arising from the right hemisphere, describing left-sided numbness and post-ictal weakness on the left side. However,a prior EEG reported left anterior temporal epileptiform discharges. 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 She denies any seizures since 10/2018, but feels drowsy on Topiramate and would like to reduce dose, we discussed reducing to 150mg  in AM, 200mg  in PM. We may need to add on a different AED in the future. Refills for sumatriptan sent for  migraines. Interval follow-up MRI brain with and without contrast for meningioma will be ordered. She is aware of [place] driving laws to stop driving until 6 months seizure-free. Follow-up in 6 months, she knows to call for any changes, continue seizure calendar.  Thank you for allowing me to participate in her care.  Please do not hesitate to call for any questions or concerns.   Ellouise Newer, M.D.   CC: Dr. Jonni Sanger

## 2019-06-13 ENCOUNTER — Ambulatory Visit: Payer: 59 | Admitting: Neurology

## 2019-06-16 ENCOUNTER — Ambulatory Visit: Payer: 59 | Attending: Internal Medicine

## 2019-06-16 DIAGNOSIS — Z23 Encounter for immunization: Secondary | ICD-10-CM

## 2019-06-16 NOTE — Progress Notes (Signed)
   Covid-19 Vaccination Clinic  Name:  Mckinley Coln    MRN: [id number] DOB: 1965/11/01  06/16/2019  Ms. Gelardi was observed post Covid-19 immunization for 15 minutes without incident. She was provided with Vaccine Information Sheet and instruction to access the V-Safe system.   Ms. Lofthouse was instructed to call 911 with any severe reactions post vaccine: Marland Kitchen Difficulty breathing  . Swelling of face and throat  . A fast heartbeat  . A bad rash all over body  . Dizziness and weakness   Immunizations Administered    Name Date Dose VIS Date Route   Pfizer COVID-19 Vaccine 06/16/2019  4:37 PM 0.3 mL 02/21/2019 Intramuscular   Manufacturer: [place]   Lot: [id number]   [place]: [id number]

## 2019-07-11 ENCOUNTER — Ambulatory Visit
Admission: RE | Admit: 2019-07-11 | Discharge: 2019-07-11 | Disposition: A | Discharge status: Home / Self Care | Payer: 59 | Source: Ambulatory Visit | Attending: Neurology | Admitting: Neurology

## 2019-07-11 ENCOUNTER — Other Ambulatory Visit: Payer: Self-pay

## 2019-07-11 DIAGNOSIS — D329 Benign neoplasm of meninges, unspecified: Secondary | ICD-10-CM

## 2019-07-11 DIAGNOSIS — G43009 Migraine without aura, not intractable, without status migrainosus: Secondary | ICD-10-CM

## 2019-07-11 DIAGNOSIS — G40209 Localization-related (focal) (partial) symptomatic epilepsy and epileptic syndromes with complex partial seizures, not intractable, without status epilepticus: Secondary | ICD-10-CM

## 2019-07-11 IMAGING — MR MR HEAD WO/W CM
13 series · 48 of 48 positions shown · IV contrast (multihance)
Comparison: Report from brain MRI [DATE] (images unavailable),
head CT [DATE].

CLINICAL DATA: Localization-related symptomatic epilepsy and
epileptic syndromes with complex partial seizures, not intractable,
without status epilepticus. Migraine without RHINE and without status
migrainosus, not intractable. Meningioma.

EXAM:
MRI HEAD WITHOUT AND WITH CONTRAST
TECHNIQUE: Multiplanar, multiecho pulse sequences of the brain and surrounding
structures were obtained without and with intravenous contrast.
CONTRAST:  14mL MULTIHANCE GADOBENATE DIMEGLUMINE 529 MG/ML IV SOLN

[Series 5: T1 · sagittal · 4.0mm · 0.75mm/px · 1 of 29 slices shown (1 of 3)]
[im 1/29]
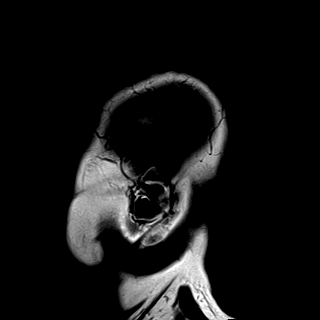

[Series 6: DWI · axial · 3.0mm · 1.44mm/px · z∈[-82,+64]mm · 5 of 84 slices shown (1 of 2)]
[im 1/84]
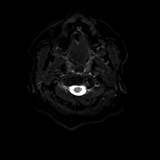
[im 21/84]
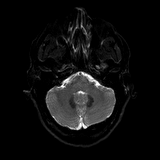
[im 42/84]
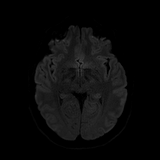
[im 63/84]
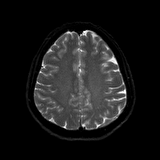
[im 84/84]
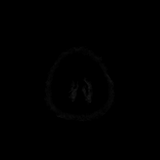

[Series 7: DWI · axial · 3.0mm · 1.44mm/px · z∈[-82,+64]mm · 3 of 42 slices shown (2 of 2)]
[im 1/42]
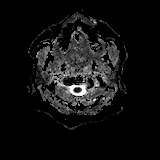
[im 21/42]
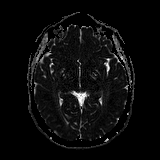
[im 42/42]
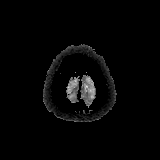

[Series 8: T2 · axial · 4.0mm · 0.36mm/px · z∈[-76,+63]mm · 2 of 27 slices shown (1 of 2)]
[im 1/27]
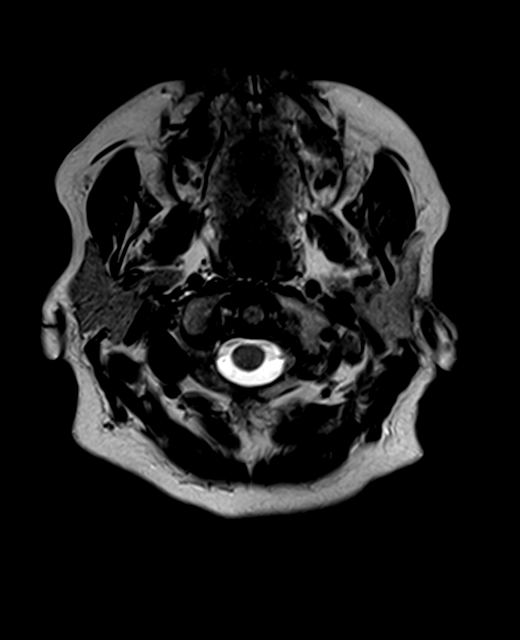
[im 27/27]
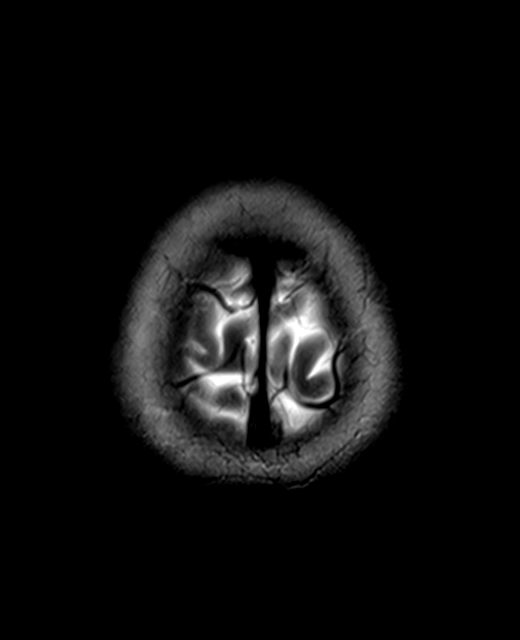

[Series 9: FLAIR · axial · 3.0mm · 0.72mm/px · z∈[-84,+65]mm · 2 of 26 slices shown (1 of 2)]
[im 1/26]
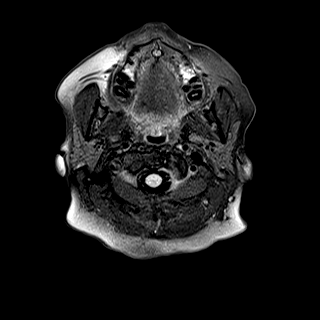
[im 26/26]
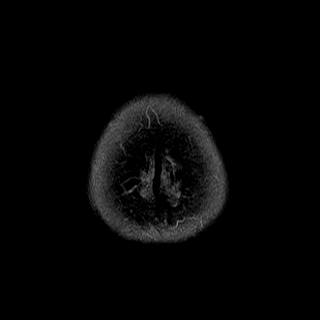

[Series 11: swi_images · axial · 2.0mm · 0.90mm/px · z∈[-77,+64]mm · 5 of 72 slices shown]
[im 1/72]
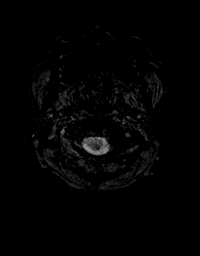
[im 18/72]
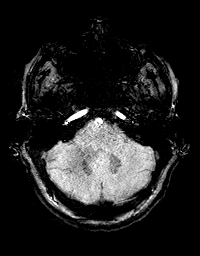
[im 36/72]
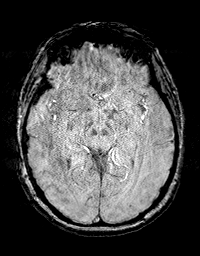
[im 54/72]
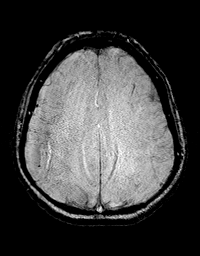
[im 72/72]
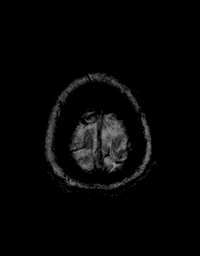

[Series 12: T1 · axial · 1.0mm · 0.94mm/px · z∈[-85,+72]mm · 10 of 160 slices shown (2 of 3)]
[im 1/160]
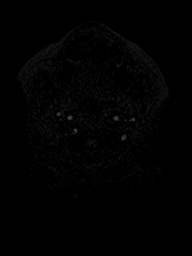
[im 18/160]
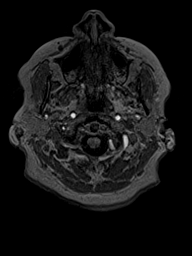
[im 36/160]
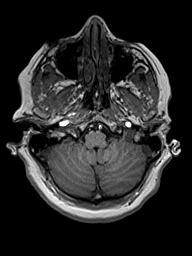
[im 54/160]
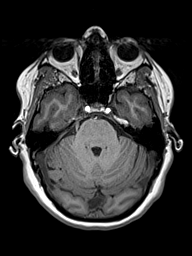
[im 71/160]
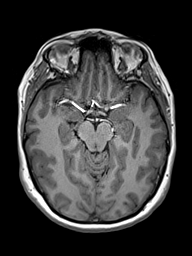
[im 89/160]
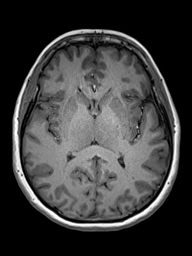
[im 107/160]
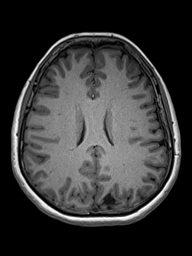
[im 124/160]
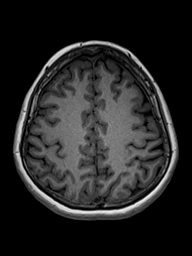
[im 142/160]
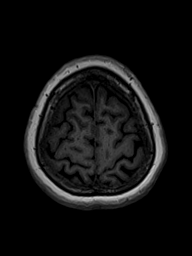
[im 160/160]
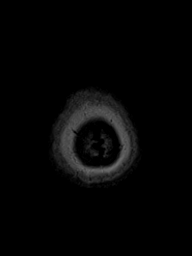

[Series 13: T2 · coronal · 3.0mm · 0.47mm/px · 2 of 25 slices shown (2 of 2)]
[im 1/25]
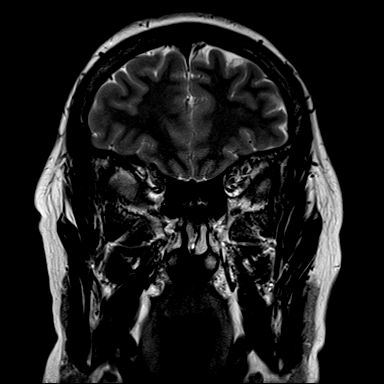
[im 25/25]
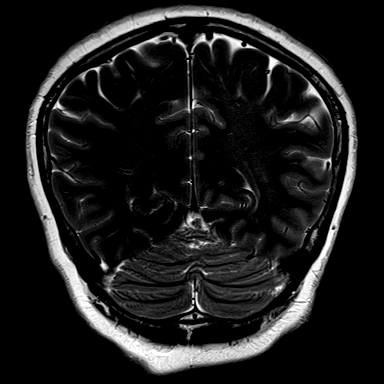

[Series 14: FLAIR · coronal · 3.0mm · 0.56mm/px · 2 of 25 slices shown (2 of 2)]
[im 1/25]
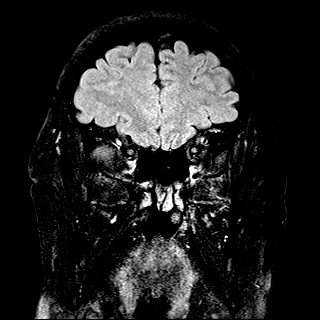
[im 25/25]
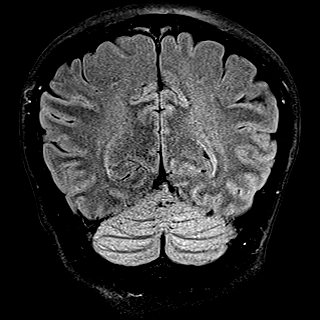

[Series 15: T2 post-contrast · coronal · 4.0mm · 0.36mm/px · 2 of 32 slices shown]
[im 1/32]
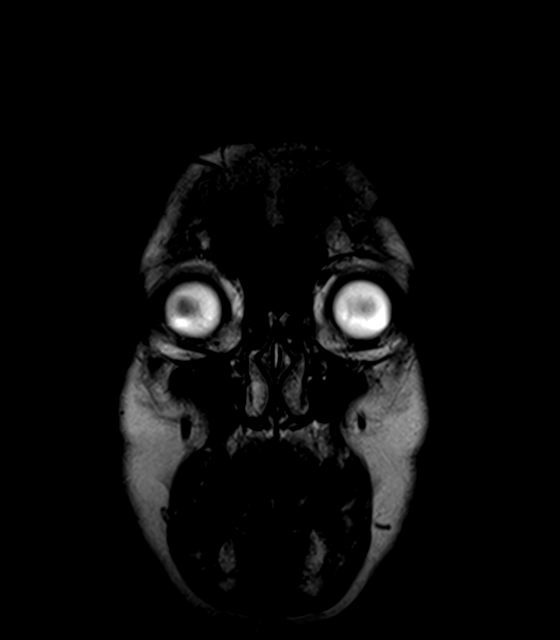
[im 32/32]
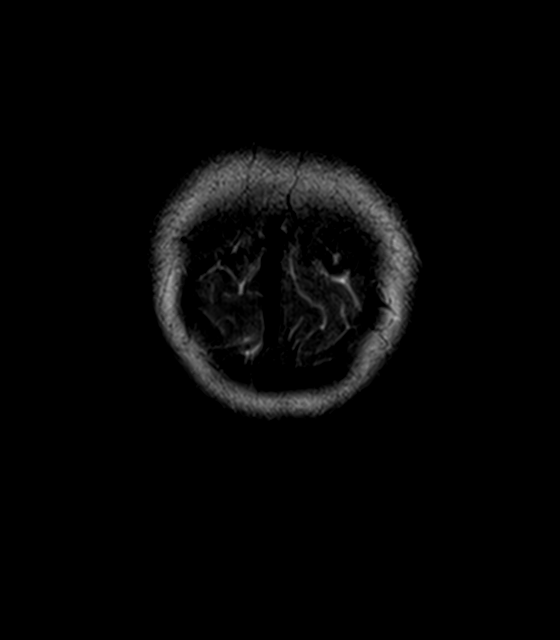

[Series 16: T1 · axial · 1.0mm · 0.94mm/px · z∈[-85,+72]mm · 10 of 160 slices shown (3 of 3)]
[im 1/160]
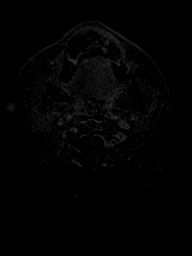
[im 18/160]
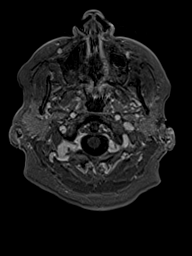
[im 36/160]
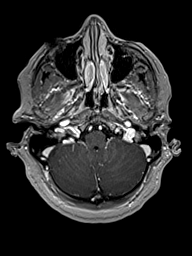
[im 54/160]
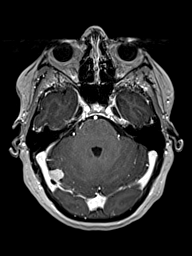
[im 71/160]
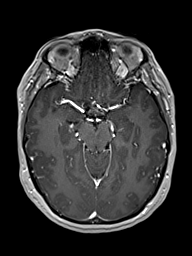
[im 89/160]
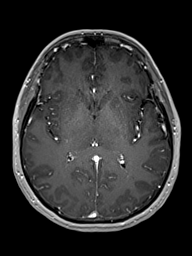
[im 107/160]
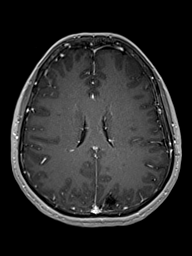
[im 124/160]
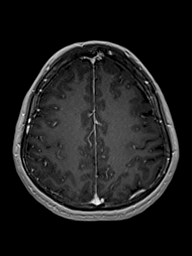
[im 142/160]
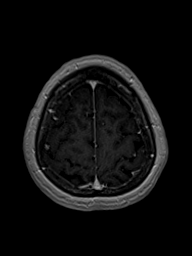
[im 160/160]
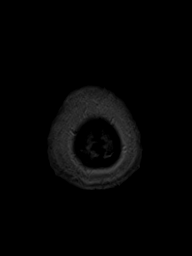

[Series 17: T1 post-contrast · coronal · 4.0mm · 0.72mm/px · 2 of 32 slices shown (1 of 2)]
[im 1/32]
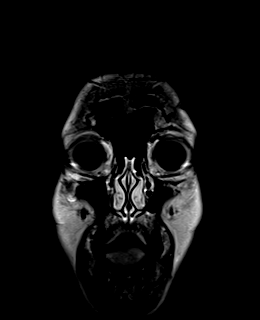
[im 32/32]
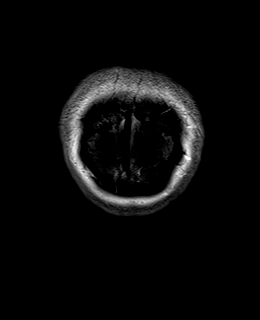

[Series 18: T1 post-contrast · sagittal · 4.0mm · 0.75mm/px · 2 of 29 slices shown (2 of 2)]
[im 1/29]
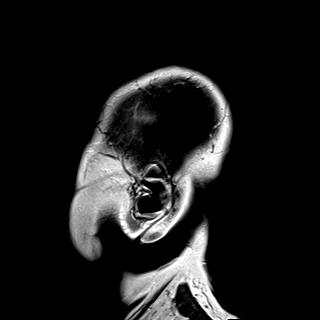
[im 29/29]
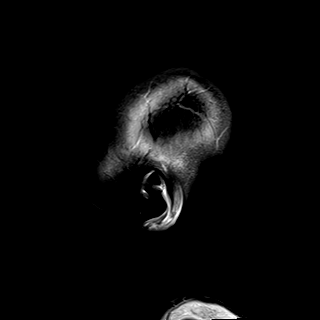

[48 of 48 positions shown; findings below may reference images not displayed]

FINDINGS: Brain:

Again demonstrated is a small focus of volume loss in the posterior
left parietal lobe which may reflect encephalomalacia or focal
atrophy (series 9, images 17 and 18) (series 12, images 103-111).
This finding was present on prior head CT [DATE].

New from prior exams, there is a 1.3 x 1.1 x 1.1 cm homogeneously
enhancing dural-based mass within the right aspect of the posterior
fossa abutting the distal transverse sinus and undersurface of the
right tentorium (series 16, image 53) (series 17, image 9). Findings
are consistent with the provided history of meningioma. Subtle
invasion of the distal right transverse sinus is difficult to
exclude (series 16, image 55). The mass contacts the underlying
right cerebellar hemisphere without parenchymal edema.

The hippocampi are symmetric in size and signal.

There are few scattered punctate nonspecific foci of T2
hyperintensity within the cerebral white matter.

Developmental venous anomalies within the right parietal lobe and
right cerebellum.

There is no acute infarct.

No chronic intracranial blood products.

No extra-axial fluid collection.

No midline shift.

Vascular: Expected proximal arterial flow voids.

Skull and upper cervical spine: No focal marrow lesion.

Sinuses/Orbits: Visualized orbits show no acute finding. Minimal
ethmoid and maxillary sinus mucosal thickening. No significant
mastoid effusion.
IMPRESSION: 1. Redemonstrated small focus of volume loss within the posterior
left parietal lobe which may reflect chronic encephalomalacia or
focal atrophy.
2. 1.3 cm meningioma within right posterior fossa, abutting the
undersurface of the right tentorium. Subtle invasion of the distal
right transverse sinus is difficult to exclude. The mass contacts
the underlying right cerebellar hemisphere without parenchymal
edema.
3. A few scattered punctate T2 hyperintense signal changes in the
cerebral white matter are nonspecific.
4. Mild ethmoid and maxillary sinus mucosal thickening.

## 2019-07-11 MED ORDER — GADOBENATE DIMEGLUMINE 529 MG/ML IV SOLN
14.0000 mL | Freq: Once | INTRAVENOUS | Status: AC | PRN
  Administered 2019-07-11: 14 mL via INTRAVENOUS

## 2019-08-07 MED FILL — SUMAtriptan SUCCINATE 100 M: 100 | 30 days supply | Qty: 10 | Fill #3

## 2019-09-18 ENCOUNTER — Other Ambulatory Visit: Payer: Self-pay | Admitting: Family Medicine

## 2019-09-18 MED FILL — SERTRALINE HCL 50 MG TABLET: 50 | 90 days supply | Qty: 270 | Fill #0

## 2019-09-18 MED FILL — TOPIRAMATE 50 MG TABLET: 50 | 30 days supply | Qty: 210 | Fill #0

## 2019-09-18 MED FILL — SUMAtriptan SUCCINATE 100 M: 100 | 30 days supply | Qty: 10 | Fill #4

## 2019-11-21 MED FILL — TOPIRAMATE 50 MG TABLET: 50 | 30 days supply | Qty: 210 | Fill #1

## 2019-11-21 MED FILL — SUMAtriptan SUCCINATE 100 M: 100 | 30 days supply | Qty: 10 | Fill #0

## 2020-01-12 ENCOUNTER — Ambulatory Visit: Payer: 59 | Admitting: Neurology

## 2020-01-13 ENCOUNTER — Encounter: Payer: Self-pay | Admitting: Neurology

## 2020-01-13 DIAGNOSIS — Z029 Encounter for administrative examinations, unspecified: Secondary | ICD-10-CM

## 2020-01-14 MED FILL — SERTRALINE HCL 50 MG TABLET: 50 | 90 days supply | Qty: 270 | Fill #1

## 2020-01-15 MED FILL — SUMAtriptan SUCCINATE 100 M: 100 | 30 days supply | Qty: 10 | Fill #1

## 2020-01-29 ENCOUNTER — Other Ambulatory Visit: Payer: Self-pay | Admitting: Neurology

## 2020-01-29 MED ORDER — GABAPENTIN 100 MG PO CAPS: ORAL_CAPSULE | ORAL | 11 refills | Status: DC

## 2020-01-29 MED FILL — GABAPENTIN 100 MG CAPSULE: 100 | 30 days supply | Qty: 60 | Fill #0

## 2020-02-25 MED FILL — TOPIRAMATE 50 MG TABLET: 50 | 30 days supply | Qty: 210 | Fill #2

## 2020-02-25 MED FILL — SUMAtriptan SUCCINATE 100 M: 100 | 30 days supply | Qty: 10 | Fill #2

## 2020-03-08 ENCOUNTER — Other Ambulatory Visit: Payer: Self-pay

## 2020-03-08 ENCOUNTER — Ambulatory Visit: Payer: Self-pay | Admitting: Family Medicine

## 2020-03-08 DIAGNOSIS — Z20822 Contact with and (suspected) exposure to covid-19: Secondary | ICD-10-CM

## 2020-03-09 ENCOUNTER — Encounter: Payer: Self-pay | Admitting: Family Medicine

## 2020-03-10 ENCOUNTER — Ambulatory Visit: Payer: Self-pay | Admitting: Family Medicine

## 2020-03-10 LAB — NOVEL CORONAVIRUS, NAA: SARS-CoV-2, NAA: NOT DETECTED

## 2020-03-10 LAB — SARS-COV-2, NAA 2 DAY TAT

## 2020-03-19 ENCOUNTER — Ambulatory Visit: Payer: Self-pay | Attending: Internal Medicine

## 2020-03-19 DIAGNOSIS — Z23 Encounter for immunization: Secondary | ICD-10-CM

## 2020-03-19 NOTE — Progress Notes (Signed)
   Covid-19 Vaccination Clinic  Name:  Victoria Bishop    MRN: [id number] DOB: Oct 25, 1965  03/19/2020  Victoria Bishop was observed post Covid-19 immunization for 15 minutes without incident. She was provided with Vaccine Information Sheet and instruction to access the V-Safe system.   Victoria Bishop was instructed to call 911 with any severe reactions post vaccine: Marland Kitchen Difficulty breathing  . Swelling of face and throat  . A fast heartbeat  . A bad rash all over body  . Dizziness and weakness   Immunizations Administered    Name Date Dose VIS Date Route   Pfizer COVID-19 Vaccine 03/19/2020  4:34 PM 0.3 mL 12/31/2019 Intramuscular   Manufacturer: [place]   Lot: [id number]   [place]: [id number]

## 2020-04-12 ENCOUNTER — Other Ambulatory Visit: Payer: Self-pay | Admitting: Family Medicine

## 2020-04-12 MED FILL — SUMAtriptan SUCCINATE 100 M: 100 | 30 days supply | Qty: 10 | Fill #3

## 2020-04-12 MED FILL — TOPIRAMATE 50 MG TABLET: 50 | 30 days supply | Qty: 210 | Fill #3

## 2020-04-12 MED FILL — GABAPENTIN 100 MG CAPSULE: 100 | 30 days supply | Qty: 60 | Fill #1

## 2020-04-14 ENCOUNTER — Other Ambulatory Visit: Payer: Self-pay | Admitting: Family Medicine

## 2020-04-14 MED FILL — SERTRALINE HCL 50 MG TABLET: 50 | 10 days supply | Qty: 30 | Fill #0

## 2020-04-16 ENCOUNTER — Telehealth: Payer: Self-pay

## 2020-04-16 ENCOUNTER — Other Ambulatory Visit: Payer: Self-pay | Admitting: Family Medicine

## 2020-04-16 ENCOUNTER — Ambulatory Visit: Payer: Self-pay | Admitting: Family Medicine

## 2020-04-16 MED ORDER — SERTRALINE HCL 50 MG PO TABS: 150.0000 mg | ORAL_TABLET | Freq: Every day | ORAL | 3 refills | Status: DC

## 2020-04-16 NOTE — Telephone Encounter (Signed)
Patient called in asking about the prescription, did advise patient of Dr.Andy's note.

## 2020-04-16 NOTE — Addendum Note (Signed)
Addended by: Billey Chang on: 04/16/2020 12:04 PM   Modules accepted: Orders

## 2020-04-16 NOTE — Telephone Encounter (Signed)
Caller states she has a question about her medicine. She went to pick up her medicine and doctor only gave her refill for 10 days and her appointment the 26th or 27th. Why is refill for only 10 days? It is not enough.

## 2020-04-16 NOTE — Telephone Encounter (Signed)
I reviewed refill. Looks like cma made an error ... I have refilled for 90 days.  Please notify (and apologize to) patient.

## 2020-04-16 NOTE — Telephone Encounter (Signed)
Attempted to call pt about medication. Was unable to leave a vm

## 2020-04-27 MED FILL — SERTRALINE HCL 50 MG TABLET: 50 | 90 days supply | Qty: 270 | Fill #0

## 2020-04-29 ENCOUNTER — Other Ambulatory Visit: Payer: Self-pay | Admitting: Family Medicine

## 2020-04-29 ENCOUNTER — Other Ambulatory Visit: Payer: Self-pay

## 2020-04-29 MED ORDER — SERTRALINE HCL 50 MG PO TABS: 150.0000 mg | ORAL_TABLET | Freq: Every day | ORAL | 3 refills | Status: DC

## 2020-05-03 ENCOUNTER — Encounter: Payer: Self-pay | Admitting: Family Medicine

## 2020-05-03 ENCOUNTER — Other Ambulatory Visit: Payer: Self-pay

## 2020-05-03 ENCOUNTER — Ambulatory Visit (INDEPENDENT_AMBULATORY_CARE_PROVIDER_SITE_OTHER): Payer: No Typology Code available for payment source | Admitting: Family Medicine

## 2020-05-03 VITALS — BP 102/68 | HR 93 | Temp 98.2°F | Resp 16 | Ht 62.0 in | Wt 159.6 lb

## 2020-05-03 DIAGNOSIS — F339 Major depressive disorder, recurrent, unspecified: Secondary | ICD-10-CM

## 2020-05-03 DIAGNOSIS — G40109 Localization-related (focal) (partial) symptomatic epilepsy and epileptic syndromes with simple partial seizures, not intractable, without status epilepticus: Secondary | ICD-10-CM | POA: Diagnosis not present

## 2020-05-03 DIAGNOSIS — E782 Mixed hyperlipidemia: Secondary | ICD-10-CM | POA: Diagnosis not present

## 2020-05-03 DIAGNOSIS — K2100 Gastro-esophageal reflux disease with esophagitis, without bleeding: Secondary | ICD-10-CM | POA: Diagnosis not present

## 2020-05-03 DIAGNOSIS — H6993 Unspecified Eustachian tube disorder, bilateral: Secondary | ICD-10-CM

## 2020-05-03 DIAGNOSIS — D329 Benign neoplasm of meninges, unspecified: Secondary | ICD-10-CM | POA: Diagnosis not present

## 2020-05-03 DIAGNOSIS — G43809 Other migraine, not intractable, without status migrainosus: Secondary | ICD-10-CM | POA: Diagnosis not present

## 2020-05-03 DIAGNOSIS — Z7185 Encounter for immunization safety counseling: Secondary | ICD-10-CM

## 2020-05-03 DIAGNOSIS — H6983 Other specified disorders of Eustachian tube, bilateral: Secondary | ICD-10-CM

## 2020-05-03 NOTE — Progress Notes (Signed)
Subjective  CC:  Chief Complaint  Patient presents with  . Hyperlipidemia  . Health Maintenance    needs mammogram ordered  . Ear Fullness    Left ear, trouble hearing, started a month ago    HPI: Victoria Bishop is a 55 y.o. female who presents to the office today to address the problems listed above in the chief complaint.  55 year old female with history of seizure disorder, meningioma and migraines, followed by neurology presents to recheck cholesterol levels.  I reviewed her recent neurology notes.  She reports that her seizures and headaches are well controlled.  Sometimes she feels a bit dizzy and thinks this could be related to her medications.  GERD: Takes chronic Zantac.  Reports this is fairly well controlled.  Major depression with anxiety: She reports she alternates between 100 150 mg intermittently.  She takes 150 mg of sertraline when she feels more anxious.  She denies panic attacks.  She denies suicidal ideation.  She tends to try to manage her anxiety with behavioral techniques like stretching.  No adverse effects from the sertraline.  This is a chronic condition.  History of hyperlipidemia: She never started a statin and her last test results over a year ago had improved.  She is not fasting today for recheck.  She tries eat a low-fat diet.  Complains of left ear fullness with occasional extra noise with head movement.  No pain.  No cold symptoms.  Symptoms started after she recovered from a URI.  She denies significant sneezing.  No sore throat.  No hearing loss  Health maintenance: Overdue for mammogram but had to be deferred due to the recent Covid booster vaccination.  She is eligible for Shingrix.  She is due for complete physical at her convenience.   Assessment  1. Mixed hyperlipidemia   2. Meningioma (HCC)   3. 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 Other migraine without status migrainosus, not intractable   5. Gastroesophageal reflux disease with  esophagitis, unspecified whether hemorrhage   6. Major depression, recurrent, chronic ([place])   7. ETD (Eustachian tube dysfunction), bilateral   8. Vaccine counseling      Plan   History of hyperlipidemia: Recheck today.  Not fasting.  Meningioma, partial epilepsy and migraines: Per neurology.  All are well controlled.  Few medications.  GERD: Continue Zantac 150 twice daily.  Well-controlled.  Major depression chronic with anxiety: Counseling and education done.  Recommend increase sertraline to 150 mg nightly.  Will reassess in 3-6.  Eustachian tube dysfunction: Reassured.  Start Zyrtec nightly.  Shingrix vaccination given today.  Will give second in 2 to 6 months.  Health maintenance: Patient to schedule mammogram  Follow up: 6 months for complete physical Visit date not found  Orders Placed This Encounter  Procedures  . CBC with Differential/Platelet  . Comprehensive metabolic panel  . Lipid panel  . TSH   No orders of the defined types were placed in this encounter.     I reviewed the patients updated PMH, FH, and SocHx.    Patient Active Problem List   Diagnosis Date Noted  . Mixed hyperlipidemia 05/03/2020  . Chronic right-sided low back pain without sciatica 02/12/2017  . Lipoma of torso 02/12/2017  . Meningioma ([place]) 07/07/2016  . Major depression, recurrent, chronic ([place]) 06/23/2016  . 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 Migraine without status migrainosus, not intractable 10/01/2015  . Helicobacter pylori (H. pylori) infection 08/17/2015  . Gastroesophageal reflux disease with esophagitis 08/13/2015  Current Meds  Medication Sig  . acetaminophen (TYLENOL) 325 MG tablet Take by mouth every 6 (six) hours as needed.   . cyclobenzaprine (FLEXERIL) 10 MG tablet Take 1 tablet (10 mg total) by mouth 3 (three) times daily as needed for muscle spasms.  Marland Kitchen gabapentin (NEURONTIN) 100 MG capsule Take 1 capsule every night for 1 week, then increase to 2 capsules  every night (Patient taking differently: daily. Take 1 capsule every night)  . hydrOXYzine (ATARAX/VISTARIL) 25 MG tablet Take 1 tablet (25 mg total) by mouth 3 (three) times daily as needed for anxiety or itching.  . naproxen (NAPROSYN) 500 MG tablet Take 1 tablet (500 mg total) by mouth 2 (two) times daily with a meal.  . ranitidine (ZANTAC) 150 MG capsule Take 1 capsule (150 mg total) by mouth 2 (two) times daily.  . sertraline (ZOLOFT) 50 MG tablet Take 3 tablets (150 mg total) by mouth at bedtime.  . SUMAtriptan (IMITREX) 100 MG tablet TAKE 1 TABLET BY MOUTH AT FIRST SIGN OF MIGRAINE-MAY REPEAT IN 2 HRS-MAX 2 TABS/24 HRS  . topiramate (TOPAMAX) 50 MG tablet Take 3 tablets in AM, 4 tablets at bedtime    Allergies: Patient has No Known Allergies. Family History: Patient family history includes Anxiety disorder in her daughter and son; Hearing loss in her son; Hyperlipidemia in her father; Hypertension in her father and mother; Other in her daughter. Social History:  Patient  reports that she has never smoked. She has never used smokeless tobacco. She reports that she does not drink alcohol and does not use drugs.  Review of Systems: Constitutional: Negative for fever malaise or anorexia Cardiovascular: negative for chest pain Respiratory: negative for SOB or persistent cough Gastrointestinal: negative for abdominal pain  Objective  Vitals: BP 102/68   Pulse 93   Temp 98.2 F (36.8 C) (Temporal)   Resp 16   Ht 5\' 2"  (1.575 m)   Wt 159 lb 9.6 oz (72.4 kg)   LMP 07/12/2014 (LMP Unknown)   SpO2 97%   BMI 29.19 kg/m  General: no acute distress , A&Ox3 HEENT: PEERL, conjunctiva normal, neck is supple, TMs normal with small effusion on the left Cardiovascular:  RRR without murmur or gallop.  Respiratory:  Good breath sounds bilaterally, CTAB with normal respiratory effort Skin:  Warm, no rashes     Commons side effects, risks, benefits, and alternatives for medications and  treatment plan prescribed today were discussed, and the patient expressed understanding of the given instructions. Patient is instructed to call or message via MyChart if he/she has any questions or concerns regarding our treatment plan. No barriers to understanding were identified. We discussed Red Flag symptoms and signs in detail. Patient expressed understanding regarding what to do in case of urgent or emergency type symptoms.   Medication list was reconciled, printed and provided to the patient in AVS. Patient instructions and summary information was reviewed with the patient as documented in the AVS. This note was prepared with assistance of Dragon voice recognition software. Occasional wrong-word or sound-a-like substitutions may have occurred due to the inherent limitations of voice recognition software  This visit occurred during the SARS-CoV-2 public health emergency.  Safety protocols were in place, including screening questions prior to the visit, additional usage of staff PPE, and extensive cleaning of exam room while observing appropriate contact time as indicated for disinfecting solutions.

## 2020-05-03 NOTE — Patient Instructions (Addendum)
Please return in 6 months for your complete physical .   I will release your lab results to you on your MyChart account with further instructions. Please reply with any questions.   Take sertraline 150mg  nightly.  Start zyrtec 10mg  daily to help your ear symptoms. (generic cetirizine is fine).   Schedule your mammogram.  Today you were given your 1st of 2 Shingrix vaccination. It will protect your from getting shingles.   If you have any questions or concerns, please don't hesitate to send me a message via MyChart or call the office at [phone]. Thank you for visiting with [place] today! It's our pleasure caring for you.

## 2020-05-04 LAB — COMPREHENSIVE METABOLIC PANEL
ALT: 17 U/L (ref 0–35)
AST: 22 U/L (ref 0–37)
Albumin: 4.4 g/dL (ref 3.5–5.2)
Alkaline Phosphatase: 76 U/L (ref 39–117)
BUN: 13 mg/dL (ref 6–23)
CO2: 25 mEq/L (ref 19–32)
Calcium: 9.6 mg/dL (ref 8.4–10.5)
Chloride: 106 mEq/L (ref 96–112)
Creatinine, Ser: 0.84 mg/dL (ref 0.40–1.20)
GFR: 77.74 mL/min (ref >60.00)
Glucose, Bld: 80 mg/dL (ref 70–99)
Potassium: 4.1 mEq/L (ref 3.5–5.1)
Sodium: 138 mEq/L (ref 135–145)
Total Bilirubin: 0.4 mg/dL (ref 0.2–1.2)
Total Protein: 7.7 g/dL (ref 6.0–8.3)

## 2020-05-04 LAB — LIPID PANEL
Cholesterol: 250 mg/dL — ABNORMAL HIGH (ref 0–200)
HDL: 55.2 mg/dL (ref >39.00)
LDL Cholesterol: 172 mg/dL — ABNORMAL HIGH (ref 0–99)
NonHDL: 194.9
Total CHOL/HDL Ratio: 5
Triglycerides: 114 mg/dL (ref 0.0–149.0)
VLDL: 22.8 mg/dL (ref 0.0–40.0)

## 2020-05-04 LAB — CBC WITH DIFFERENTIAL/PLATELET
Basophils Absolute: 0 10*3/uL (ref 0.0–0.1)
Basophils Relative: 0.8 % (ref 0.0–3.0)
Eosinophils Absolute: 0 10*3/uL (ref 0.0–0.7)
Eosinophils Relative: 0.8 % (ref 0.0–5.0)
HCT: 41.1 % (ref 36.0–46.0)
Hemoglobin: 13.7 g/dL (ref 12.0–15.0)
Lymphocytes Relative: 40.3 % (ref 12.0–46.0)
Lymphs Abs: 2.2 10*3/uL (ref 0.7–4.0)
MCHC: 33.4 g/dL (ref 30.0–36.0)
MCV: 91.3 fl (ref 78.0–100.0)
Monocytes Absolute: 0.3 10*3/uL (ref 0.1–1.0)
Monocytes Relative: 5.2 % (ref 3.0–12.0)
Neutro Abs: 2.8 10*3/uL (ref 1.4–7.7)
Neutrophils Relative %: 52.9 % (ref 43.0–77.0)
Platelets: 269 10*3/uL (ref 150.0–400.0)
RBC: 4.5 Mil/uL (ref 3.87–5.11)
RDW: 13.1 % (ref 11.5–15.5)
WBC: 5.4 10*3/uL (ref 4.0–10.5)

## 2020-05-04 LAB — TSH: TSH: 1.55 u[IU]/mL (ref 0.35–4.50)

## 2020-05-10 ENCOUNTER — Other Ambulatory Visit: Payer: Self-pay

## 2020-05-10 ENCOUNTER — Ambulatory Visit (INDEPENDENT_AMBULATORY_CARE_PROVIDER_SITE_OTHER): Payer: No Typology Code available for payment source | Admitting: Neurology

## 2020-05-10 ENCOUNTER — Encounter: Payer: Self-pay | Admitting: Neurology

## 2020-05-10 VITALS — BP 90/61 | HR 84 | Ht 62.0 in | Wt 160.6 lb

## 2020-05-10 DIAGNOSIS — D329 Benign neoplasm of meninges, unspecified: Secondary | ICD-10-CM

## 2020-05-10 DIAGNOSIS — G40209 Localization-related (focal) (partial) symptomatic epilepsy and epileptic syndromes with complex partial seizures, not intractable, without status epilepticus: Secondary | ICD-10-CM

## 2020-05-10 DIAGNOSIS — G43009 Migraine without aura, not intractable, without status migrainosus: Secondary | ICD-10-CM | POA: Diagnosis not present

## 2020-05-10 MED ORDER — TOPIRAMATE 50 MG PO TABS: ORAL_TABLET | ORAL | 11 refills | Status: DC

## 2020-05-10 MED ORDER — SUMATRIPTAN SUCCINATE 100 MG PO TABS: ORAL_TABLET | ORAL | 11 refills | Status: DC

## 2020-05-10 MED ORDER — GABAPENTIN 100 MG PO CAPS: ORAL_CAPSULE | ORAL | 11 refills | Status: DC

## 2020-05-10 NOTE — Progress Notes (Signed)
NEUROLOGY FOLLOW UP OFFICE NOTE  Victoria Bishop [id number] 01/28/1966  HISTORY OF PRESENT ILLNESS: I had the pleasure of seeing Victoria Bishop in follow-up in the neurology clinic on 05/10/2020.  The patient was last seen almost a year ago for recurrent seizures and migraines. She is alone in the office today. Records and images were reviewed. I personally reviewed MRI brain with and without contrast done 06/2019 which did not show any acute changes. There was a small focus of volume loss in the posterior left parietal lobe, 1.3 x 1.1 x 1.1 cm dural-based mass within the right aspect of the posterior fossa abutting the distal transverse sinus and undersurface of the right tentorium, subtle invasion of the distal right transverse sinus is difficult to exclude. The mass contacts the underlying right cerebellar hemisphere without parenchymal edema. Her daughter [name] had contacted our office via [place] in 01/2020 to report that she has always had what she felt like tension at the back of her head right above her neck, massage usually helps but Faby noticed it usually precedes a migraine or seizure. She was reporting the pain lasting longer or getting it post-migraines and seizures with similarity to nerve pain. She mentioned one seizure during her sleep earlier in November but mainly complained of migraines 1-2 times a week. We discussed adding on gabapentin 100mg  qhs for migraine and seizure prophylaxis, in addition to Topiramate 150mg  in AM, 200mg  in PM. She was previously on higher dose Topamax but requested to reduce dose due to drowsiness. 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 She takes Sumatriptan which helps, but sometimes needs to take another dose the next day. Sometimes it feels like there is not enough oxygen in her head, describing a tension/pressure. She initially denied any seizures, and is  vague due to concerns about losing her license. Discussed the need to let me know about seizure frequency to help her better, she reports having an episode around once a month with no shaking, unclear if she is unresponsive, she states when she gets home from work she feels she wants to sleep. She feels the left side of her head is very tired, different from her migraines which are typically on the right side of her head. She denies any associated nausea/vomiting, vision can get blurred. No falls.    History on Initial Assessment 05/11/2017: This is a 55 yo RH woman presenting for migraines, meningioma resection, and seizures. She recently moved from [place], records from her Neurologist in [place], [place] were reviewed. She started having seizures in July 2000 while still living in [place]. At that time she had eclampsia during delivery, she had pain radiating to her head then lost awareness. She was admitted to the hospital for a month and had renal failure requiring dialysis. Per report, 3 days later, she had seizures. There is an MRI brain report from July 2000 was concerning for PRES, reporting abnormal foci of signal intensity involving the cortical and subcortical regions of the posterior parietal and occipital lobes in a fairly symmetrical fashion. The largest area of abnormal signal involves the left posterior parietal lobe. Initially she was having episodes of loss of consciousness with convulsions. She was initially started on Tegretol and Dilantin immediately post-partum, and had been very well controlled on carbamazepine XR for several years. There is an EEG report from 11/2005 quoted from [place] reporting "epileptiform activity generalized, although there was  not significant associated clinical motor activity." She had an EEG in [place] in 04/2006 reporting occasional sharp and slow wave complexes that appear epileptiform over the left anterior temporal regions. They deny any convulsions since 2000. Since then,  she has been having recurrent stereotyped episodes starting with left-sided numbness and tingling on the arm, leg, face, and left side of her tongue. The left side of her head "feels like it is going weak." She describes electric shocks. There is no jerking or twitching but she describes them as spasms, then she feels weaker on the left side after. She cannot concentrate when this occurs, she speaks but slows down a lot, keeps saying "uhm" for several minutes. Apparently she can still function and do things when this occurs. She is sleepy afterwards and wakes up feeling better, but her daughter notices it takes a couple of days to return to her normal baseline. There is no associated headaches. She has difficulty saying how often they are, sometimes occurring twice a week, other times every other week. Last episode was 1.5 weeks ago. When she has them at night, she has really bad dreams. Her husband thinks she is having nightmares but when she wakes up she feels different. She denies any olfactory/gustatory hallucinations, rising epigastric sensation. She has noticed some gaps in time, her daughter has to repeat things some times.   Records from [place] were reviewed. She was transitioned off carbamazepine to zonisamide in 2010 due to concerns for osteoporosis, however had abdominal pain and stomach burning, and was switched to Levetiracetam. 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 She reports that headaches worsened after the surgery and was started on Topiramate in 2015. She feels this has helped with the headaches but not the seizures. She is taking Topiramate 50mg  BID. Higher doses in the past caused side effects. She reports stopping the Keppra last February 2018 because her mood swings were horrible and anxiety was  heightened. This has increased seizures frequency, but the anxiety and mood swings are better. Her Zoloft dose was also increased. She takes sumatriptan around twice a week on average for the migraines. Sleep is good. She denies any dizziness, diplopia, dysarthria/dysphagia, neck/back pain, bowel/bladder dysfunction. They have noticed she has a cough that would not stop if she has mint or a drink. She  Diagnostic Data: 03/20/2016 EEG normal wake and sleep EEG MRI brain 09/02/2015: Stable to slightly decreased size of rounded, well-circumscribed, homogenously  enhancing, extradural right cerebellar hemisphere lesion, again most consistent with a meningioma; this measures approximately 1.2 x 1.2 x 1.3 cm (AP x ML x CC) today. No new lesions suspicious for meningioma are seen. 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 She had meningioma resection in 2013. Otherwise she had a normal birth and early development.  There is no history of febrile convulsions, CNS infections such as meningitis/encephalitis, significant traumatic brain injury, or family history of seizures.  Prior AEDs: Tegretol, Dilantin, Keppra, Zonisamide   PAST MEDICAL HISTORY: Past Medical History:  Diagnosis Date  . Anxiety   . Brain tumor ([place]) 2013  . Depression   .  Epilepsy ([place])   . GERD (gastroesophageal reflux disease)   . Meningitis 2013  . Migraines   . Seizures ([place])     MEDICATIONS: Current Outpatient Medications on File Prior to Visit  Medication Sig Dispense Refill  . cyclobenzaprine (FLEXERIL) 10 MG tablet Take 1 tablet (10 mg total) by mouth 3 (three) times daily as needed for muscle spasms. 30 tablet 1  . gabapentin (NEURONTIN) 100 MG capsule Take 1  capsule every night for 1 week, then increase to 2 capsules every night (Patient taking differently: daily. Take 1 capsule every night) 60 capsule 11  . hydrOXYzine (ATARAX/VISTARIL) 25 MG tablet Take 1 tablet (25 mg total) by mouth 3 (three) times daily as needed for anxiety or itching. 60 tablet 2  . ranitidine (ZANTAC) 150 MG capsule Take 1 capsule (150 mg total) by mouth 2 (two) times daily. 60 capsule 2  . sertraline (ZOLOFT) 50 MG tablet Take 3 tablets (150 mg total) by mouth at bedtime. 270 tablet 3  . SUMAtriptan (IMITREX) 100 MG tablet TAKE 1 TABLET BY MOUTH AT FIRST SIGN OF MIGRAINE-MAY REPEAT IN 2 HRS-MAX 2 TABS/24 HRS 10 tablet 11  . topiramate (TOPAMAX) 50 MG tablet Take 3 tablets in AM, 4 tablets at bedtime 210 tablet 11  . acetaminophen (TYLENOL) 325 MG tablet Take by mouth every 6 (six) hours as needed.  (Patient not taking: Reported on 05/10/2020)    . naproxen (NAPROSYN) 500 MG tablet Take 1 tablet (500 mg total) by mouth 2 (two) times daily with a meal. (Patient not taking: Reported on 05/10/2020) 60 tablet 1   No current facility-administered medications on file prior to visit.    ALLERGIES: No Known Allergies  FAMILY HISTORY: Family History  Problem Relation Age of Onset  . Hypertension Mother   . Hypertension Father   . Hyperlipidemia Father   . Anxiety disorder Daughter   . Hearing loss Son   . Anxiety disorder Son   . Other Daughter        vader syndrome  . Diabetes Neg Hx   . Cancer Neg Hx     SOCIAL HISTORY: Social History   Socioeconomic History  . Marital status: Married    Spouse name: Not on file  . Number of children: Not on file  . Years of education: Not on file  . Highest education level: Not on file  Occupational History  . Occupation: food Metallurgist: [employer]  Tobacco Use  . Smoking status: Never Smoker  . Smokeless tobacco: Never Used  Vaping Use  . Vaping Use: Never used  Substance and Sexual Activity  .  Alcohol use: No  . Drug use: No  . Sexual activity: Yes    Birth control/protection: None  Other Topics Concern  . Not on file  Social History Narrative   Originally from [place], non smoker and non drinker, lives with husband and a son; 3 children (2 daughters and 1 son), 1 daughter passed at age 51       Lives in 1 story home   9th grade education   Works with [employer]   Social Determinants of Radio broadcast assistant Strain: Not on [employer] Insecurity: Not on file  Transportation Needs: Not on file  Physical Activity: Not on file  Stress: Not on file  Social Connections: Not on file  Intimate Partner Violence: Not on file     PHYSICAL EXAM: Vitals:   05/10/20  0959  BP: 90/61  Pulse: 84  SpO2: 96%   General: No acute distress Head:  Normocephalic/atraumatic Skin/Extremities: No rash, no edema Neurological Exam: alert and awake. No aphasia or dysarthria. Fund of knowledge is appropriate.  Attention and concentration are normal.   Cranial nerves: Pupils equal, round. Extraocular movements intact with no nystagmus. Visual fields full.  No facial asymmetry.  Motor: Bulk and tone normal, muscle strength 5/5 throughout with no pronator drift.   Finger to nose testing intact.  Gait narrow-based and steady, able to tandem walk adequately.  Romberg negative.   IMPRESSION: This is a 55 yo RH woman with a history of migraines, meningioma resection, and seizures suggestive of focal seizures without impairment of awareness, possibly arising from the right hemisphere, describing left-sided numbness and post-ictal weakness on the left side. However,a prior EEG reported left anterior temporal epileptiform discharges. MRI brain done 06/2019 no acute changes, there is again volume loss in the left posterior parietal region after developing PRES in 2000, and unchanged meningioma in the right posterior fossa abutting posterior right transverse sinus. There appears to have been a  reduction in migraines, she is a poor historian, but reports 2-3 migraines a month (daughter reported 1-2 a week prior to starting gabapentin). She reports seizures around once a month. We discussed increasing gabapentin for seizure and migraine prophylaxis, she would like to stay on the same dose for now. Continue Topiramate 150mg  in AM, 200mg  in PM. She has prn sumatriptan for migraine rescue. She is aware of [place] driving laws to stop driving after a seizure until 6 months seizure-free. Follow-up in 6-8 months, she knows to call for any changes.     Thank you for allowing me to participate in her care.  Please do not hesitate to call for any questions or concerns.   Ellouise Newer, M.D.   CC: Dr. Jonni Sanger

## 2020-05-10 NOTE — Patient Instructions (Signed)
Continue gabapentin 100mg : take 1 capsule every night, Topamax 50mg : take 3 tabs in AM, 4 tabs in PM, and as needed Sumatriptan. If migraines or seizures increase, there is a lot of room to increase the gabapentin, please contact our office and we can send in a new prescription. Follow-up in 6-8 months, call for any changes.   Seizure Precautions: 1. If medication has been prescribed for you to prevent seizures, take it exactly as directed.  Do not stop taking the medicine without talking to your doctor first, even if you have not had a seizure in a long time.   2. Avoid activities in which a seizure would cause danger to yourself or to others.  Don't operate dangerous machinery, swim alone, or climb in high or dangerous places, such as on ladders, roofs, or girders.  Do not drive unless your doctor says you may.  3. If you have any warning that you may have a seizure, lay down in a safe place where you can't hurt yourself.    4.  No driving for 6 months from last seizure, as per [facility].   Please refer to the following link on the [facility] website for more information: http://[email or web address]/[email or web address]/driving/drivingu.cfm   5.  Maintain good sleep hygiene. Avoid alcohol  6.  Contact your doctor if you have any problems that may be related to the medicine you are taking.  7.  Call 911 and bring the patient back to the ED if:        A.  The seizure lasts longer than 5 minutes.       B.  The patient doesn't awaken shortly after the seizure  C.  The patient has new problems such as difficulty seeing, speaking or moving  D.  The patient was injured during the seizure  E.  The patient has a temperature over 102 F (39C)  F.  The patient vomited and now is having trouble breathing

## 2020-07-18 ENCOUNTER — Encounter: Payer: Self-pay | Admitting: Family Medicine

## 2020-07-21 NOTE — Telephone Encounter (Signed)
Please refer to ENT for left ear fullness and notify pt. thanks

## 2020-07-21 NOTE — Telephone Encounter (Signed)
Pt called following up on this message. Please advise.  

## 2020-07-22 ENCOUNTER — Other Ambulatory Visit (HOSPITAL_COMMUNITY): Payer: Self-pay

## 2020-07-22 ENCOUNTER — Other Ambulatory Visit: Payer: Self-pay

## 2020-07-22 DIAGNOSIS — H938X2 Other specified disorders of left ear: Secondary | ICD-10-CM

## 2020-08-24 ENCOUNTER — Other Ambulatory Visit: Payer: Self-pay

## 2020-08-24 ENCOUNTER — Ambulatory Visit (INDEPENDENT_AMBULATORY_CARE_PROVIDER_SITE_OTHER): Payer: No Typology Code available for payment source | Admitting: Otolaryngology

## 2020-08-24 DIAGNOSIS — H903 Sensorineural hearing loss, bilateral: Secondary | ICD-10-CM

## 2020-08-24 NOTE — Progress Notes (Signed)
HPI: Victoria Bishop is a 55 y.o. female who presents for evaluation of decreased hearing in both ears especially on the left side.  She also complains of chronic tinnitus in the left ear that is gotten worse over the last 6 to 8 months.  She has had previous history of a meningioma removed on the right side in 2013.  She also has history of seizures as well as migraines for which she takes medication.  She has not previously had a hearing test.  Past Medical History:  Diagnosis Date   Anxiety    Brain tumor ([facility]) 2013   Depression    Epilepsy ([facility])    GERD (gastroesophageal reflux disease)    Meningitis 2013   Migraines    Seizures ([facility])    Past Surgical History:  Procedure Laterality Date   BRAIN SURGERY  2014   CESAREAN SECTION     CT RADIATION THERAPY GUIDE  2014   Social History   Socioeconomic History   Marital status: Married    Spouse name: Not on file   Number of children: Not on file   Years of education: Not on file   Highest education level: Not on file  Occupational History   Occupation: food server    Employer: [employer]  Tobacco Use   Smoking status: Never   Smokeless tobacco: Never  Vaping Use   Vaping Use: Never used  Substance and Sexual Activity   Alcohol use: No   Drug use: No   Sexual activity: Yes    Birth control/protection: None  Other Topics Concern   Not on file  Social History Narrative   Originally from [place], non smoker and non drinker, lives with husband and a son; 3 children (2 daughters and 1 son), 1 daughter passed at age 44       Lives in 1 story home   9th grade education   Works with [employer]   Social Determinants of Radio broadcast assistant Strain: Not on file  Food Insecurity: Not on file  Transportation Needs: Not on file  Physical Activity: Not on file  Stress: Not on file  Social Connections: Not on file   Family History  Problem Relation Age of Onset   Hypertension Mother     Hypertension Father    Hyperlipidemia Father    Anxiety disorder Daughter    Hearing loss Son    Anxiety disorder Son    Other Daughter        vader syndrome   Diabetes Neg Hx    Cancer Neg Hx    No Known Allergies Prior to Admission medications   Medication Sig Start Date End Date Taking? Authorizing Provider  acetaminophen (TYLENOL) 325 MG tablet Take by mouth every 6 (six) hours as needed.  Patient not taking: Reported on 05/10/2020    [provider]  cyclobenzaprine (FLEXERIL) 10 MG tablet Take 1 tablet (10 mg total) by mouth 3 (three) times daily as needed for muscle spasms. 01/23/18   Chevis Pretty, FNP  gabapentin (NEURONTIN) 100 MG capsule Take 1 capsule every night 05/10/20   Cameron Sprang, MD  hydrOXYzine (ATARAX/VISTARIL) 25 MG tablet Take 1 tablet (25 mg total) by mouth 3 (three) times daily as needed for anxiety or itching. 11/08/18   Leamon Arnt, MD  naproxen (NAPROSYN) 500 MG tablet Take 1 tablet (500 mg total) by mouth 2 (two) times daily with a meal. Patient not taking: Reported on 05/10/2020  01/23/18   Hassell Done, Mary-Margaret, FNP  ranitidine (ZANTAC) 150 MG capsule Take 1 capsule (150 mg total) by mouth 2 (two) times daily. 01/08/18   Leamon Arnt, MD  sertraline (ZOLOFT) 50 MG tablet TAKE 3 TABLETS (150 MG TOTAL) BY MOUTH AT BEDTIME. 04/29/20 04/29/21  Leamon Arnt, MD  SUMAtriptan (IMITREX) 100 MG tablet TAKE 1 TABLET BY MOUTH AT FIRST SIGN OF MIGRAINE-MAY REPEAT IN 2 HRS-MAX 2 TABS/24 HRS 05/10/20   Cameron Sprang, MD  topiramate (TOPAMAX) 50 MG tablet Take 3 tablets in AM, 4 tablets at bedtime 05/10/20   Cameron Sprang, MD     Positive ROS: Otherwise negative  All other systems have been reviewed and were otherwise negative with the exception of those mentioned in the HPI and as above.  Physical Exam: Constitutional: Alert, well-appearing, no acute distress Ears: External ears without lesions or tenderness. Ear canals are clear bilaterally  with intact, clear TMs bilaterally. Nasal: External nose without lesions. Septum relatively midline.. Clear nasal passages Oral: Lips and gums without lesions. Tongue and palate mucosa without lesions. Posterior oropharynx clear. Neck: No palpable adenopathy or masses Respiratory: Breathing comfortably  Skin: No facial/neck lesions or rash noted.  Audiologic testing demonstrated moderate to severe bilateral sensorineural hearing loss which is worse on the left side.  She had type A tympanograms bilaterally.  SRT's were 40 DB on the right and 60 DB on the left.  Procedures  Assessment: Bilateral moderate to severe downsloping sensorineural hearing loss in both ears worse on the left side with secondary tinnitus.  Plan: Would recommend proceeding with hearing aids and discussed this with her. Of note her hearing loss was not symmetric and on review of her last MRI scan performed 1 year ago there was no evidence of cochlear or retrocochlear pathology  Radene Journey, MD

## 2020-09-28 ENCOUNTER — Ambulatory Visit: Payer: Self-pay | Admitting: Neurology

## 2020-11-03 ENCOUNTER — Encounter: Payer: No Typology Code available for payment source | Admitting: Family Medicine

## 2020-11-10 ENCOUNTER — Encounter: Payer: No Typology Code available for payment source | Admitting: Family Medicine

## 2020-11-23 ENCOUNTER — Encounter (INDEPENDENT_AMBULATORY_CARE_PROVIDER_SITE_OTHER): Payer: Self-pay

## 2021-01-12 ENCOUNTER — Other Ambulatory Visit: Payer: Self-pay

## 2021-01-12 ENCOUNTER — Ambulatory Visit (INDEPENDENT_AMBULATORY_CARE_PROVIDER_SITE_OTHER): Payer: No Typology Code available for payment source | Admitting: Family Medicine

## 2021-01-12 DIAGNOSIS — Z23 Encounter for immunization: Secondary | ICD-10-CM | POA: Diagnosis not present

## 2021-01-12 NOTE — Progress Notes (Signed)
Victoria Bishop is a 55 y.o. female presents to the office today for flu shot per physician's orders.   [name]

## 2021-01-17 ENCOUNTER — Other Ambulatory Visit: Payer: Self-pay

## 2021-01-17 ENCOUNTER — Ambulatory Visit (INDEPENDENT_AMBULATORY_CARE_PROVIDER_SITE_OTHER): Payer: No Typology Code available for payment source | Admitting: Neurology

## 2021-01-17 ENCOUNTER — Encounter: Payer: Self-pay | Admitting: Neurology

## 2021-01-17 VITALS — BP 105/70 | HR 92 | Ht 62.0 in | Wt 165.4 lb

## 2021-01-17 DIAGNOSIS — G40209 Localization-related (focal) (partial) symptomatic epilepsy and epileptic syndromes with complex partial seizures, not intractable, without status epilepticus: Secondary | ICD-10-CM

## 2021-01-17 DIAGNOSIS — G43009 Migraine without aura, not intractable, without status migrainosus: Secondary | ICD-10-CM

## 2021-01-17 DIAGNOSIS — D329 Benign neoplasm of meninges, unspecified: Secondary | ICD-10-CM | POA: Diagnosis not present

## 2021-01-17 MED ORDER — TOPIRAMATE 50 MG PO TABS: ORAL_TABLET | ORAL | 11 refills | Status: DC

## 2021-01-17 MED ORDER — SUMATRIPTAN SUCCINATE 100 MG PO TABS: ORAL_TABLET | ORAL | 11 refills | Status: DC

## 2021-01-17 MED ORDER — GABAPENTIN 100 MG PO CAPS: ORAL_CAPSULE | ORAL | 11 refills | Status: DC

## 2021-01-17 NOTE — Progress Notes (Signed)
NEUROLOGY FOLLOW UP OFFICE NOTE  HELAINA STEFANO [id number] 12/14/1965  HISTORY OF PRESENT ILLNESS: I had the pleasure of seeing Malachi Suderman in follow-up in the neurology clinic on 01/17/2021. The patient was last seen 7 months ago for recurrent seizures and migraines. She is again alone in the office today. Since her last visit, she states she has not had any seizures in a long time. Her daughter has not mentioned any seizures to her, sometimes she feels tired, which she attributes to her medications. 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 Sometimes she does not have a migraine for weeks, sometimes she has 2 in a week. Last migraine was a week ago with associated nausea. She denies any focal numbness/tingling/weakness. She is on Topiramate 150mg  in AM, 200mg  in PM and Gabapentin 100mg  qhs without significant side effects. Her last  MRI brain with and without contrast done 06/2019 did not show any acute changes. There was a small focus of volume loss in the posterior left parietal lobe, 1.3 x 1.1 x 1.1 cm dural-based mass within the right aspect of the posterior fossa abutting the distal transverse sinus and undersurface of the right tentorium, subtle invasion of the distal right transverse sinus is difficult to exclude. The mass contacts the underlying right cerebellar hemisphere without parenchymal edema.     History on Initial Assessment 05/11/2017: This is a 55 yo RH woman presenting for migraines, meningioma resection, and seizures. She recently moved from [place], records from her Neurologist in [place], [place] were reviewed. She started having seizures in July 2000 while still living in [place]. At that time she had eclampsia during delivery, she had pain radiating to her head then lost awareness. She was admitted to the hospital for a month and had renal failure requiring dialysis. Per report, 3 days later,  she had seizures. There is an MRI brain report from July 2000 was concerning for PRES, reporting abnormal foci of signal intensity involving the cortical and subcortical regions of the posterior parietal and occipital lobes in a fairly symmetrical fashion. The largest area of abnormal signal involves the left posterior parietal lobe. Initially she was having episodes of loss of consciousness with convulsions. She was initially started on Tegretol and Dilantin immediately post-partum, and had been very well controlled on carbamazepine XR for several years. There is an EEG report from 11/2005 quoted from [facility] reporting "epileptiform activity generalized, although there was not significant associated clinical motor activity." She had an EEG in [place] in 04/2006 reporting occasional sharp and slow wave complexes that appear epileptiform over the left anterior temporal regions. They deny any convulsions since 2000. Since then, she has been having recurrent stereotyped episodes starting with left-sided numbness and tingling on the arm, leg, face, and left side of her tongue. The left side of her head "feels like it is going weak." She describes electric shocks. There is no jerking or twitching but she describes them as spasms, then she feels weaker on the left side after. She cannot concentrate when this occurs, she speaks but slows down a lot, keeps saying "uhm" for several minutes. Apparently she can still function and do things when this occurs. She is sleepy afterwards and wakes up feeling better, but her daughter notices it takes a couple of days to return to her normal baseline. There is no associated headaches. She has difficulty saying how often they are, sometimes  occurring twice a week, other times every other week. Last episode was 1.5 weeks ago. When she has them at night, she has really bad dreams. Her husband thinks she is having nightmares but when she wakes up she feels different. She denies any  olfactory/gustatory hallucinations, rising epigastric sensation. She has noticed some gaps in time, her daughter has to repeat things some times.    Records from [place] were reviewed. She was transitioned off carbamazepine to zonisamide in 2010 due to concerns for osteoporosis, however had abdominal pain and stomach burning, and was switched to Levetiracetam. 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 She reports that headaches worsened after the surgery and was started on Topiramate in 2015. She feels this has helped with the headaches but not the seizures. She is taking Topiramate 50mg  BID. Higher doses in the past caused side effects. She reports stopping the Keppra last February 2018 because her mood swings were horrible and anxiety was heightened. This has increased seizures frequency, but the anxiety and mood swings are better. Her Zoloft dose was also increased. She takes sumatriptan around twice a week on average for the migraines. Sleep is good. She denies any dizziness, diplopia, dysarthria/dysphagia, neck/back pain, bowel/bladder dysfunction. They have noticed she has a cough that would not stop if she has mint or a drink. She   Diagnostic Data: 03/20/2016 EEG normal wake and sleep EEG MRI brain 09/02/2015: Stable to slightly decreased size of rounded, well-circumscribed, homogenously  enhancing, extradural right cerebellar hemisphere lesion, again most consistent with a meningioma; this measures approximately 1.2 x 1.2 x 1.3 cm (AP x ML x CC) today. No new lesions suspicious for meningioma are seen. 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 She had meningioma resection in 2013. Otherwise she had a normal birth and early development.  There is no history of febrile convulsions, CNS infections such as meningitis/encephalitis, significant traumatic brain injury, or family history of seizures.   Prior AEDs: Tegretol, Dilantin, Keppra, Zonisamide   PAST MEDICAL HISTORY: Past Medical History:  Diagnosis Date   Anxiety    Brain tumor ([facility]) 2013   Depression    Epilepsy ([facility])    GERD (gastroesophageal reflux disease)    Meningitis 2013   Migraines    Seizures ([facility])     MEDICATIONS: Current Outpatient Medications on File Prior to Visit  Medication Sig Dispense Refill   acetaminophen (TYLENOL) 325 MG tablet Take by mouth every 6 (six) hours as needed.  (Patient not taking: Reported on 05/10/2020)     cyclobenzaprine (FLEXERIL) 10 MG tablet Take 1 tablet (10 mg total) by mouth 3 (three) times daily as needed for muscle spasms. 30 tablet 1   gabapentin (NEURONTIN) 100 MG capsule Take 1 capsule every night 30 capsule 11   hydrOXYzine (ATARAX/VISTARIL) 25 MG tablet Take 1 tablet (25 mg total) by mouth 3 (three) times daily as needed for anxiety or itching. 60 tablet 2   naproxen (NAPROSYN) 500 MG tablet Take 1 tablet (500 mg total) by mouth 2 (two) times daily with a meal. (Patient not taking: Reported  on 05/10/2020) 60 tablet 1   ranitidine (ZANTAC) 150 MG capsule Take 1 capsule (150 mg total) by mouth 2 (two) times daily. 60 capsule 2   sertraline (ZOLOFT) 50 MG tablet TAKE 3 TABLETS (150 MG TOTAL) BY MOUTH AT BEDTIME. 270 tablet 3   SUMAtriptan (IMITREX) 100 MG tablet TAKE 1 TABLET BY MOUTH AT FIRST SIGN OF MIGRAINE-MAY REPEAT IN 2 HRS-MAX 2 TABS/24 HRS 10 tablet 11   topiramate (TOPAMAX) 50 MG tablet Take 3 tablets in AM, 4 tablets at bedtime 210 tablet 11   No current facility-administered medications on file prior to visit.     ALLERGIES: No Known Allergies  FAMILY HISTORY: Family History  Problem Relation Age of Onset   Hypertension Mother    Hypertension Father    Hyperlipidemia Father    Anxiety disorder Daughter    Hearing loss Son    Anxiety disorder Son    Other Daughter        vader syndrome   Diabetes Neg Hx    Cancer Neg Hx     SOCIAL HISTORY: Social History   Socioeconomic History   Marital status: Married    Spouse name: Not on file   Number of children: Not on file   Years of education: Not on file   Highest education level: Not on file  Occupational History   Occupation: food server    Employer: [employer]  Tobacco Use   Smoking status: Never   Smokeless tobacco: Never  Vaping Use   Vaping Use: Never used  Substance and Sexual Activity   Alcohol use: No   Drug use: No   Sexual activity: Yes    Birth control/protection: None  Other Topics Concern   Not on file  Social History Narrative   Originally from [place], non smoker and non drinker, lives with husband and a son; 3 children (2 daughters and 1 son), 1 daughter passed at age 21       Lives in 1 story home   9th grade education   Works with [employer]   Social Determinants of Radio broadcast assistant Strain: Not on file  Food Insecurity: Not on file  Transportation Needs: Not on file  Physical Activity: Not on file  Stress: Not on file  Social Connections: Not on file  Intimate Partner Violence: Not on file     PHYSICAL EXAM: Vitals:   01/17/21 0925  BP: 105/70  Pulse: 92  SpO2: 98%   General: No acute distress Head:  Normocephalic/atraumatic Skin/Extremities: No rash, no edema Neurological Exam: alert and awake. No aphasia or dysarthria. Fund of knowledge is appropriate.   Attention and concentration are normal.   Cranial nerves: Pupils equal, round. Extraocular movements intact with no nystagmus. Visual fields full.  No facial asymmetry.  Motor: Bulk and tone normal,  muscle strength 5/5 throughout with no pronator drift.   Finger to nose testing intact.  Gait narrow-based and steady, able to tandem walk adequately.     IMPRESSION: This is a 55 yo RH woman with a history of migraines, meningioma resection, and seizures suggestive of focal seizures without impairment of awareness, possibly arising from the right hemisphere, describing left-sided numbness and post-ictal weakness on the left side. However,a prior EEG reported left anterior temporal epileptiform discharges. MRI brain done 06/2019 no acute changes, there is again volume loss in the left posterior parietal region after developing PRES in 2000, and unchanged meningioma in the right posterior fossa abutting  posterior right transverse sinus. Interval follow-up brain MRI with and without contrast will be ordered. She is satisfied with migraine frequency and would like to stay on current doses of Topiramate 150mg  in AM, 200mg  in PM and gabapentin 100mg  qhs. She has prn sumatriptan for migraine rescue. She denies any seizures but there is no family to corroborate history today. She is aware of [place] driving laws to stop driving until 6 months seizure-free. Follow-up in 7-8 months, she knows to call for any changes.    Thank you for allowing me to participate in her care.  Please do not hesitate to call for any questions or concerns.   Ellouise Newer, M.D.   CC: Dr. Jonni Sanger

## 2021-01-17 NOTE — Patient Instructions (Signed)
Schedule repeat MRI brain with and without contrast  2. Continue all your medications  3. Follow-up in 7-8 months, call for any changes   Seizure Precautions: 1. If medication has been prescribed for you to prevent seizures, take it exactly as directed.  Do not stop taking the medicine without talking to your doctor first, even if you have not had a seizure in a long time.   2. Avoid activities in which a seizure would cause danger to yourself or to others.  Don't operate dangerous machinery, swim alone, or climb in high or dangerous places, such as on ladders, roofs, or girders.  Do not drive unless your doctor says you may.  3. If you have any warning that you may have a seizure, lay down in a safe place where you can't hurt yourself.    4.  No driving for 6 months from last seizure, as per [facility]   Please refer to the following link on the [facility] website for more information: http://[email or web address].org/[email or web address]/driving/drivingu.cfm   5.  Maintain good sleep hygiene. Avoid alcohol.  6.  Contact your doctor if you have any problems that may be related to the medicine you are taking.  7.  Call 911 and bring the patient back to the ED if:        A.  The seizure lasts longer than 5 minutes.       B.  The patient doesn't awaken shortly after the seizure  C.  The patient has new problems such as difficulty seeing, speaking or moving  D.  The patient was injured during the seizure  E.  The patient has a temperature over 102 F (39C)  F.  The patient vomited and now is having trouble breathing

## 2021-01-22 ENCOUNTER — Other Ambulatory Visit: Payer: No Typology Code available for payment source

## 2021-02-23 ENCOUNTER — Encounter: Payer: No Typology Code available for payment source | Admitting: Family Medicine

## 2021-04-25 ENCOUNTER — Other Ambulatory Visit: Payer: Self-pay

## 2021-04-25 ENCOUNTER — Ambulatory Visit (INDEPENDENT_AMBULATORY_CARE_PROVIDER_SITE_OTHER): Payer: No Typology Code available for payment source | Admitting: Internal Medicine

## 2021-04-25 ENCOUNTER — Ambulatory Visit (HOSPITAL_COMMUNITY)
Admission: RE | Admit: 2021-04-25 | Discharge: 2021-04-25 | Disposition: A | Discharge status: Home / Self Care | Payer: No Typology Code available for payment source | Source: Ambulatory Visit | Attending: Internal Medicine | Admitting: Internal Medicine

## 2021-04-25 VITALS — BP 122/80 | HR 87 | Temp 98.5°F | Wt 166.7 lb

## 2021-04-25 DIAGNOSIS — E782 Mixed hyperlipidemia: Secondary | ICD-10-CM

## 2021-04-25 DIAGNOSIS — R079 Chest pain, unspecified: Secondary | ICD-10-CM

## 2021-04-25 DIAGNOSIS — R072 Precordial pain: Secondary | ICD-10-CM | POA: Diagnosis not present

## 2021-04-25 IMAGING — CT CT ANGIO CHEST
3 of 8 series · 18 of 46 positions shown · IV contrast (agent unspecified)
Comparison: None.

CLINICAL DATA: Chest pain, radiating to shoulder blades, rule out
aortic dissection

EXAM:
CT ANGIOGRAPHY CHEST WITH CONTRAST
TECHNIQUE: Multidetector CT imaging of the chest was performed using the
standard protocol during bolus administration of intravenous
contrast. Multiplanar CT image reconstructions and MIPs were
obtained to evaluate the vascular anatomy.

[Series 5: lungs pre · axial · non-contrast · 0.75mm/px · z∈[+116,+350]mm · 9 of 147 slices shown]
[im 15/147  lung]
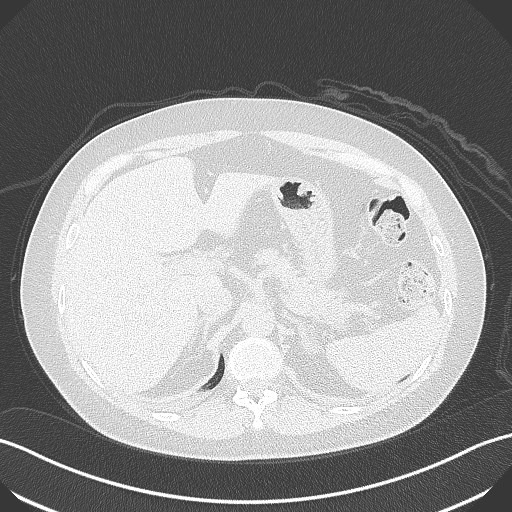
[im 30/147  soft-tissue]
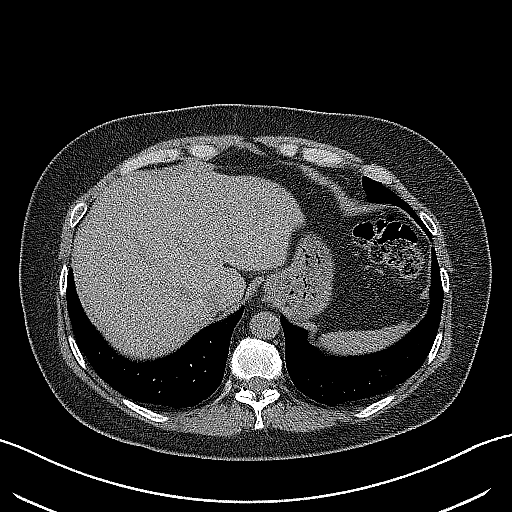
[im 44/147  lung]
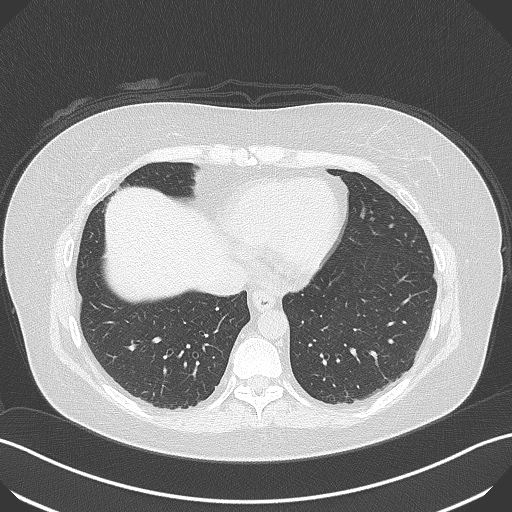
[im 59/147  soft-tissue]
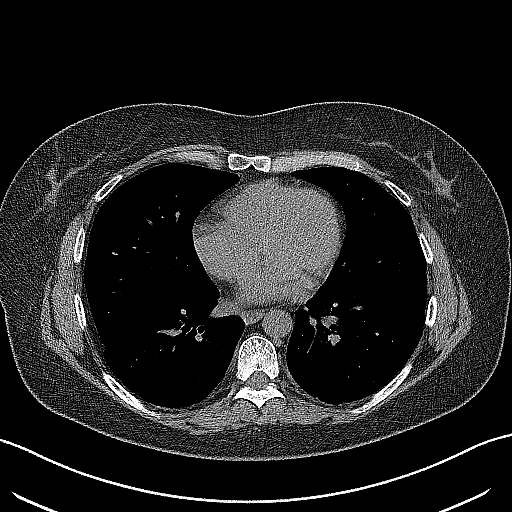
[im 74/147  lung]
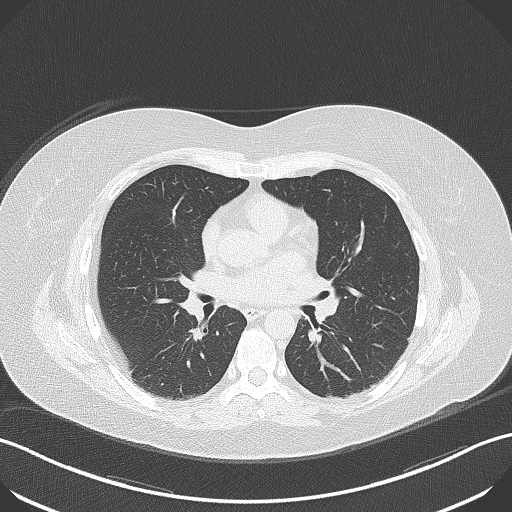
[im 88/147  soft-tissue]
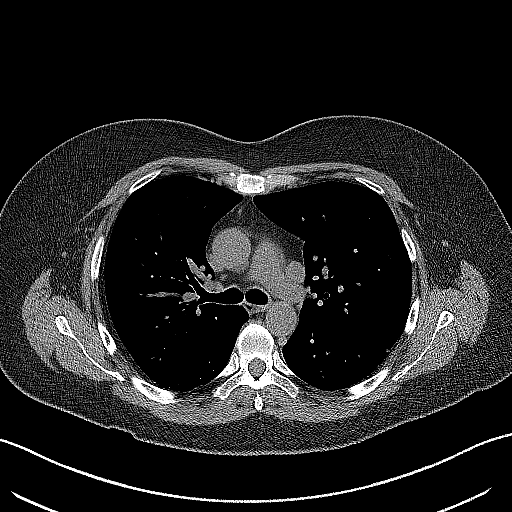
[im 103/147  lung]
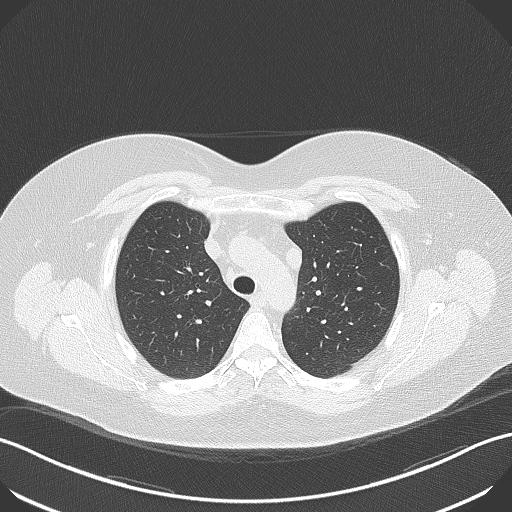
[im 117/147  soft-tissue]
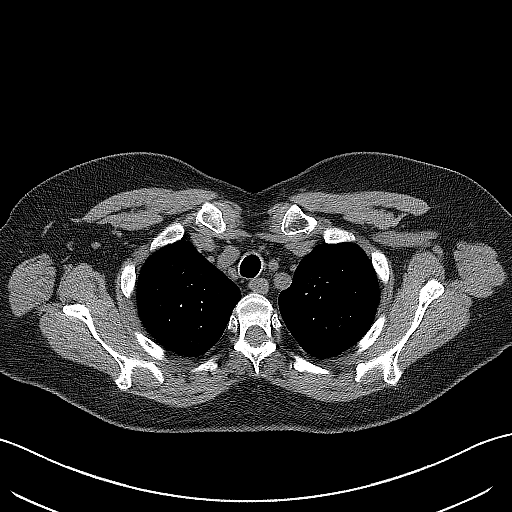
[im 132/147  lung]
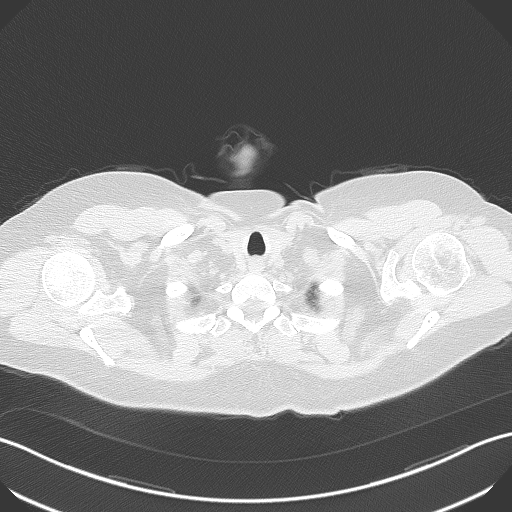

[Series 8: lung · axial · 0.73mm/px · z∈[+156,+296]mm · 6 of 127 slices shown]
[im 15/127  soft-tissue]
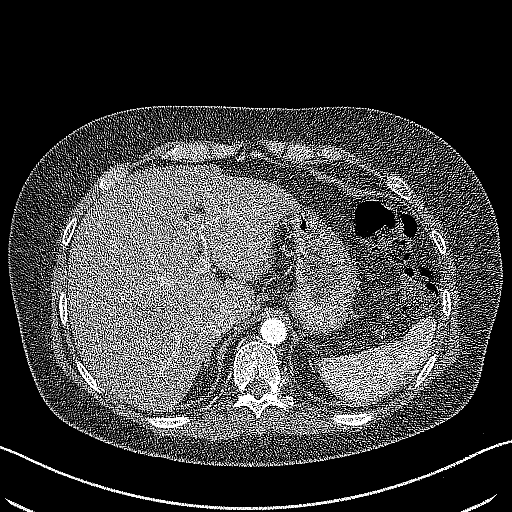
[im 29/127  soft-tissue]
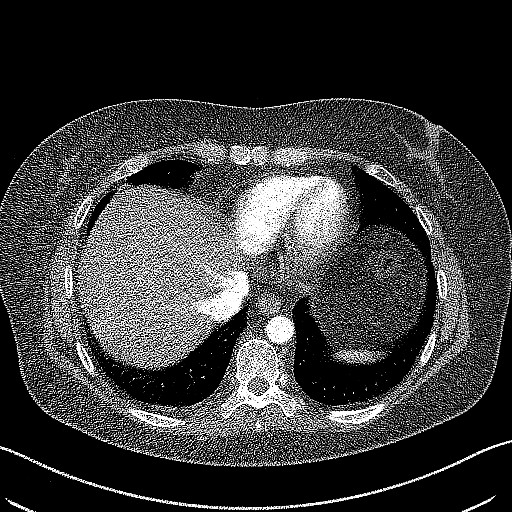
[im 43/127  soft-tissue]
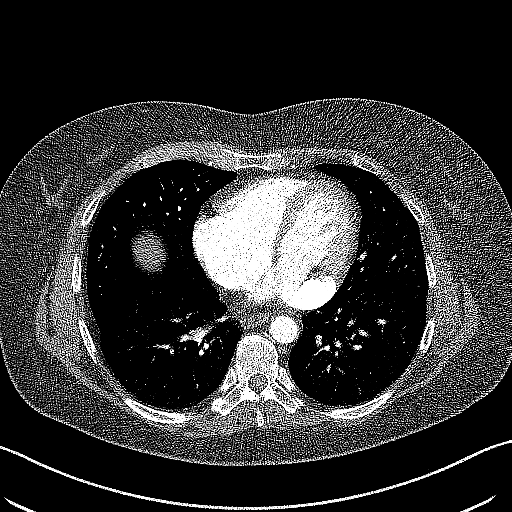
[im 57/127  soft-tissue]
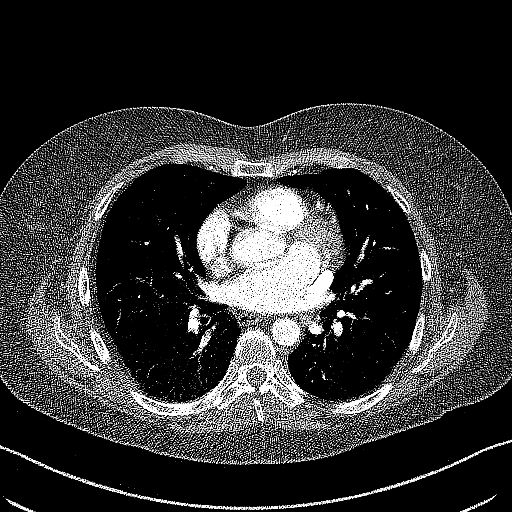
[im 71/127  soft-tissue]
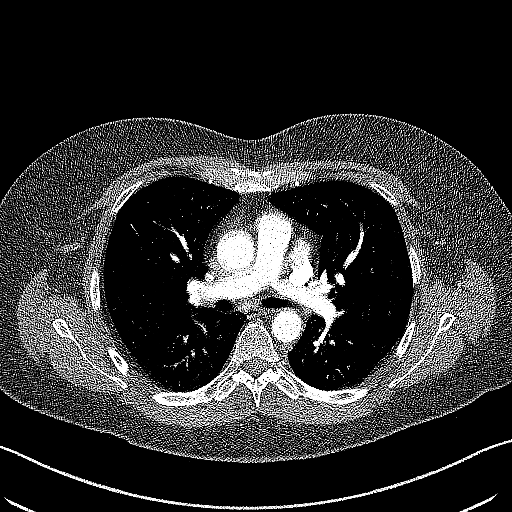
[im 85/127  soft-tissue]
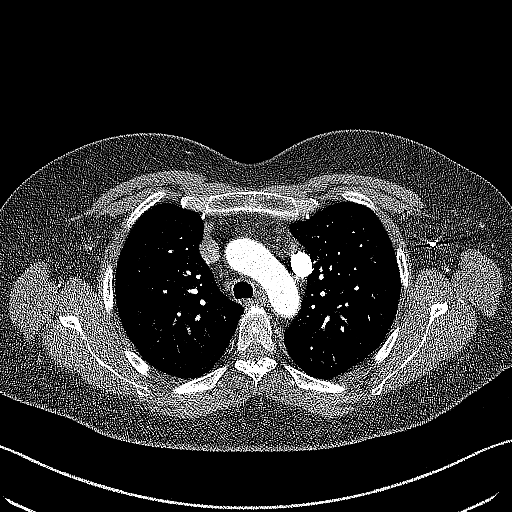

[Series 9: coronals · coronal · 0.59mm/px · 3 of 117 slices shown]
[im 30/117  soft-tissue]
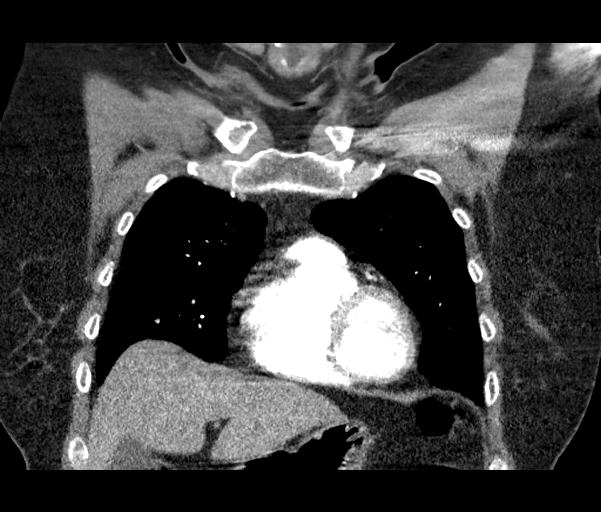
[im 59/117  soft-tissue]
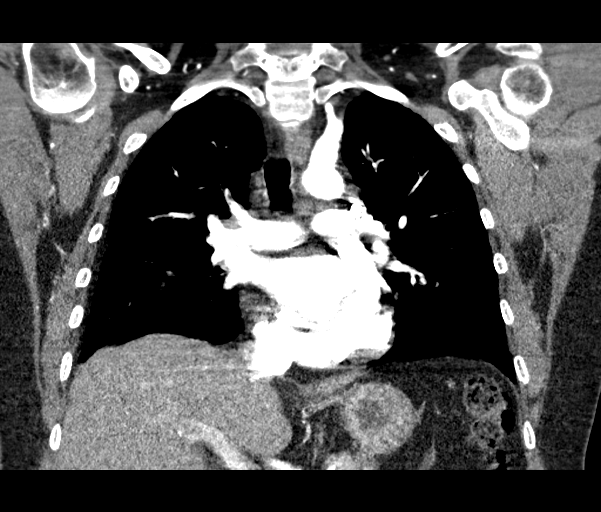
[im 88/117  soft-tissue]
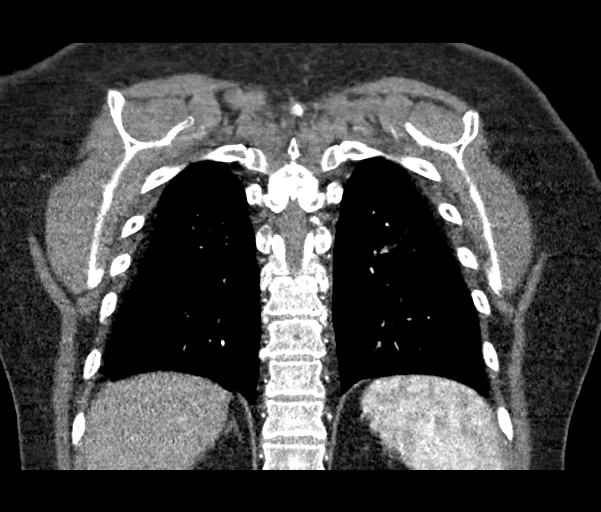

[18 of 46 positions shown; findings below may reference images not displayed]

RADIATION DOSE REDUCTION: This exam was performed according to the
departmental dose-optimization program which includes automated
exposure control, adjustment of the mA and/or kV according to
patient size and/or use of iterative reconstruction technique.

CONTRAST:  100mL OMNIPAQUE IOHEXOL 350 MG/ML SOLN
FINDINGS: Cardiovascular: Preferential opacification of the thoracic aorta. No
evidence of thoracic aortic aneurysm or dissection. The heart is at
the upper limit of normal in size, with suspected mild enlargement
of the right ventricle and atrium. No pericardial effusion. Reflux
of contrast into the hepatic veins, which can be seen in the setting
right heart dysfunction.

Mediastinum/Nodes: No enlarged mediastinal, hilar, or axillary lymph
nodes. Thyroid gland, trachea, and esophagus demonstrate no
significant findings.

Lungs/Pleura: Evaluation is somewhat limited by motion. Within this
limitation, no focal pulmonary opacity or pleural effusion. No
pneumothorax.

Upper Abdomen: No acute abnormality.

Musculoskeletal: No acute osseous abnormality.

Review of the MIP images confirms the above findings.
IMPRESSION: 1. Negative for aortic dissection or aneurysm.
2. The heart appears to be at the upper limit of normal in size,
particularly the right atrium and ventricle, with reflux of contrast
into the hepatic veins, which can be seen in the setting of right
heart dysfunction. Correlate with BNP.

## 2021-04-25 MED ORDER — IOHEXOL 350 MG/ML SOLN
100.0000 mL | Freq: Once | INTRAVENOUS | Status: AC | PRN
  Administered 2021-04-25: 100 mL via INTRAVENOUS

## 2021-04-25 NOTE — Progress Notes (Signed)
Acute office Visit     This visit occurred during the SARS-CoV-2 public health emergency.  Safety protocols were in place, including screening questions prior to the visit, additional usage of staff PPE, and extensive cleaning of exam room while observing appropriate contact time as indicated for disinfecting solutions.    CC/Reason for Visit: Chest pain  HPI: Victoria Bishop is a 56 y.o. female who is coming in today for the above mentioned reasons.  She is routinely followed by Dr. Jonni Sanger.  She is seen today as an acute visit for a 4-day duration of substernal chest pain that feels like something heavy is on her chest with associated dyspnea on exertion.  In the past 2 days she feels like pain is radiating to her back around her shoulder blades.  Dyspnea has worsened.  Sometimes she feels like she cannot catch her breath.  She is a never smoker.  She has no known family history for coronary artery disease.  Her only CAD risk factor is a mild hyperlipidemia.  Past Medical/Surgical History: Past Medical History:  Diagnosis Date   Anxiety    Brain tumor ([place]) 2013   Depression    Epilepsy ([place])    GERD (gastroesophageal reflux disease)    Meningitis 2013   Migraines    Seizures ([place])     Past Surgical History:  Procedure Laterality Date   BRAIN SURGERY  2014   CESAREAN SECTION     CT RADIATION THERAPY GUIDE  2014    Social History:  reports that she has never smoked. She has never used smokeless tobacco. She reports that she does not drink alcohol and does not use drugs.  Allergies: No Known Allergies  Family History:  Family History  Problem Relation Age of Onset   Hypertension Mother    Hypertension Father    Hyperlipidemia Father    Anxiety disorder Daughter    Hearing loss Son    Anxiety disorder Son    Other Daughter        vader syndrome   Diabetes Neg Hx    Cancer Neg Hx      Current Outpatient Medications:    gabapentin (NEURONTIN) 100 MG  capsule, Take 1 capsule every night, Disp: 30 capsule, Rfl: 11   hydrOXYzine (ATARAX/VISTARIL) 25 MG tablet, Take 1 tablet (25 mg total) by mouth 3 (three) times daily as needed for anxiety or itching., Disp: 60 tablet, Rfl: 2   naproxen (NAPROSYN) 500 MG tablet, Take 1 tablet (500 mg total) by mouth 2 (two) times daily with a meal., Disp: 60 tablet, Rfl: 1   sertraline (ZOLOFT) 50 MG tablet, TAKE 3 TABLETS (150 MG TOTAL) BY MOUTH AT BEDTIME., Disp: 270 tablet, Rfl: 3   SUMAtriptan (IMITREX) 100 MG tablet, TAKE 1 TABLET BY MOUTH AT FIRST SIGN OF MIGRAINE-MAY REPEAT IN 2 HRS-MAX 2 TABS/24 HRS, Disp: 10 tablet, Rfl: 11   topiramate (TOPAMAX) 50 MG tablet, Take 3 tablets in AM, 4 tablets at bedtime, Disp: 210 tablet, Rfl: 11   cyclobenzaprine (FLEXERIL) 10 MG tablet, Take 1 tablet (10 mg total) by mouth 3 (three) times daily as needed for muscle spasms. (Patient not taking: Reported on 04/25/2021), Disp: 30 tablet, Rfl: 1   ranitidine (ZANTAC) 150 MG capsule, Take 1 capsule (150 mg total) by mouth 2 (two) times daily. (Patient not taking: Reported on 04/25/2021), Disp: 60 capsule, Rfl: 2  Review of Systems:  Constitutional: Denies fever, chills, diaphoresis, appetite change and fatigue.  HEENT: Denies photophobia, eye pain, redness, hearing loss, ear pain, congestion, sore throat, rhinorrhea, sneezing, mouth sores, trouble swallowing, neck pain, neck stiffness and tinnitus.   Respiratory: Denies  cough,  and wheezing.   Cardiovascular: Denies chest pain, palpitations and leg swelling.  Gastrointestinal: Denies nausea, vomiting, abdominal pain, diarrhea, constipation, blood in stool and abdominal distention.  Genitourinary: Denies dysuria, urgency, frequency, hematuria, flank pain and difficulty urinating.  Endocrine: Denies: hot or cold intolerance, sweats, changes in hair or nails, polyuria, polydipsia. Musculoskeletal: Denies myalgias, back pain, joint swelling, arthralgias and gait problem.  Skin:  Denies pallor, rash and wound.  Neurological: Denies dizziness, seizures, syncope, weakness, light-headedness, numbness and headaches.  Hematological: Denies adenopathy. Easy bruising, personal or family bleeding history  Psychiatric/Behavioral: Denies suicidal ideation, mood changes, confusion, nervousness, sleep disturbance and agitation    Physical Exam: Vitals:   04/25/21 1348  BP: 122/80  Pulse: 87  Temp: 98.5 F (36.9 C)  TempSrc: Oral  SpO2: 97%  Weight: 166 lb 11.2 oz (75.6 kg)    Body mass index is 30.49 kg/m.   Constitutional: NAD, calm, comfortable Eyes: PERRL, lids and conjunctivae normal ENMT: Mucous membranes are moist.  Respiratory: clear to auscultation bilaterally, no wheezing, no crackles. Normal respiratory effort. No accessory muscle use.  Cardiovascular: Regular rate and rhythm, no murmurs / rubs / gallops. No extremity edema.  Neurologic: Grossly intact and nonfocal Psychiatric: Normal judgment and insight. Alert and oriented x 3. Normal mood.    Impression and Plan:  Chest pain, unspecified type - Plan: EKG 12-Lead, CT ANGIO CHEST AORTA W/CM & OR WO/CM  Precordial pain  Mixed hyperlipidemia  -With substernal chest pain radiating to back and between shoulder blades, dyspnea on exertion I am concerned about possibility of acute coronary syndrome and aortic dissection, less likely PE. -In office EKG interpreted by myself as sinus rhythm at a rate of 70, normal axis, no acute ST or T wave changes. -We have ordered a stat CT chest to be performed today. -Patient knows to proceed to the emergency department if symptoms persist/improve.  Time spent: 33 minutes reviewing chart, interviewing and examining patient and formulating plan of care.     Lelon Frohlich, MD  [facility] at [facility]

## 2021-04-26 ENCOUNTER — Other Ambulatory Visit: Payer: No Typology Code available for payment source

## 2021-05-09 ENCOUNTER — Ambulatory Visit (INDEPENDENT_AMBULATORY_CARE_PROVIDER_SITE_OTHER): Payer: No Typology Code available for payment source | Admitting: Family Medicine

## 2021-05-09 ENCOUNTER — Other Ambulatory Visit (HOSPITAL_COMMUNITY): Payer: Self-pay

## 2021-05-09 ENCOUNTER — Encounter: Payer: Self-pay | Admitting: Family Medicine

## 2021-05-09 ENCOUNTER — Other Ambulatory Visit: Payer: Self-pay

## 2021-05-09 VITALS — BP 120/78 | HR 90 | Temp 98.1°F | Ht 62.0 in | Wt 162.6 lb

## 2021-05-09 DIAGNOSIS — F339 Major depressive disorder, recurrent, unspecified: Secondary | ICD-10-CM

## 2021-05-09 DIAGNOSIS — G40209 Localization-related (focal) (partial) symptomatic epilepsy and epileptic syndromes with complex partial seizures, not intractable, without status epilepticus: Secondary | ICD-10-CM | POA: Diagnosis not present

## 2021-05-09 DIAGNOSIS — R9389 Abnormal findings on diagnostic imaging of other specified body structures: Secondary | ICD-10-CM

## 2021-05-09 DIAGNOSIS — Z23 Encounter for immunization: Secondary | ICD-10-CM

## 2021-05-09 DIAGNOSIS — D329 Benign neoplasm of meninges, unspecified: Secondary | ICD-10-CM | POA: Diagnosis not present

## 2021-05-09 MED ORDER — HYDROXYZINE HCL 25 MG PO TABS
25.0000 mg | ORAL_TABLET | Freq: Three times a day (TID) | ORAL | 5 refills | Status: AC | PRN
Start: 2021-05-09 — End: ?
  Filled 2021-05-09 – 2021-05-17 (×2): qty 60, 20d supply, fill #0
  Filled 2021-11-18: qty 60, 20d supply, fill #1

## 2021-05-09 NOTE — Patient Instructions (Signed)
Please schedule your complete physical with Dr. Jerilee Hoh.   Take care!   Today you were given your 2nd of 2 shingrix vaccinations.  I have refilled your hydroxyzine.   If you have any questions or concerns, please don't hesitate to send me a message via MyChart or call the office at [phone]. Thank you for visiting with Victoria Bishop today! It's our pleasure caring for you.

## 2021-05-09 NOTE — Progress Notes (Signed)
Subjective  CC:  Chief Complaint  Patient presents with   Follow-up    CT scan results    HPI: Victoria Bishop is a 56 y.o. female who presents to the office today to address the problems listed above in the chief complaint. 56 year old who had substernal chest pain and was evaluated a few weeks ago.  CT scan of the chest was done to rule out aortic dissection.  Fortunately that looked good.  She did have some possible mild right sided cardiomegaly with reflux in the hepatic veins.  She sent here to follow-up on that.  Patient denies lower extremity edema, further chest pain or crepitation.  She has no history of hypertension.  She feels well.  No longer having substernal chest pain.  She does report that she has problems with her left shoulder after a fall due to a clavicular fracture.  She does have left-sided chest pain and back pain when she overdoes it.  She thinks this might have been the cause.  No GERD.  Otherwise feeling well. Epilepsy: Reviewed recent neurology notes.  Stable.  History of meningioma.  Imaging showed stable in size. Depression is stable on medication.  She does request a refill of hydroxyzine for anxiety. She is a [place] speaker and would like to change Dr. Jerilee Hoh who speaks Spanish.  She is due for her physical. Eligible for her 2nd shingrix vaccination. I documented we gave the 1st in February 2021.   Assessment  1. Abnormal chest CT   2. Major depression, recurrent, chronic (HCC) Chronic  3. Localization-related symptomatic epilepsy and epileptic syndromes with complex partial seizures, not intractable, without status epilepticus (HCC) Chronic  4. Meningioma (HCC) Chronic     Plan  Abnormal chest CT: Possible right atrial ventricular enlargement.  No symptoms of significant diastolic heart failure.  Defer echocardiogram at this time.  Education given.  We will order further testing if patient becomes symptomatic in any way.  Patient prefers this plan at  this time.  EKG was reviewed.  It was normal. Continue antidepressant.  Refilled hydroxyzine.  Stable depression. Meningioma and epilepsy: Both stable per neurology She will follow-up with Dr. Jerilee Hoh for complete physical  Follow up: Return if symptoms worsen or fail to improve.  Visit date not found  Orders Placed This Encounter  Procedures   Varicella-zoster vaccine IM (Shingrix)   Meds ordered this encounter  Medications   hydrOXYzine (ATARAX) 25 MG tablet    Sig: Take 1 tablet (25 mg total) by mouth 3 (three) times daily as needed for anxiety.    Dispense:  60 tablet    Refill:  5      I reviewed the patients updated PMH, FH, and SocHx.    Patient Active Problem List   Diagnosis Date Noted   Mixed hyperlipidemia 05/03/2020   Chronic right-sided low back pain without sciatica 02/12/2017   Lipoma of torso 02/12/2017   Meningioma ([place]) 07/07/2016   Major depression, recurrent, chronic ([place]) 06/23/2016   Partial epilepsy ([place]) 10/01/2015   Migraine without status migrainosus, not intractable 47/11/6281   Helicobacter pylori (H. pylori) infection 08/17/2015   Gastroesophageal reflux disease with esophagitis 08/13/2015   Current Meds  Medication Sig   cyclobenzaprine (FLEXERIL) 10 MG tablet Take 1 tablet (10 mg total) by mouth 3 (three) times daily as needed for muscle spasms.   gabapentin (NEURONTIN) 100 MG capsule Take 1 capsule every night   sertraline (ZOLOFT) 50 MG tablet TAKE 3 TABLETS (150 MG  TOTAL) BY MOUTH AT BEDTIME.   SUMAtriptan (IMITREX) 100 MG tablet TAKE 1 TABLET BY MOUTH AT FIRST SIGN OF MIGRAINE-MAY REPEAT IN 2 HRS-MAX 2 TABS/24 HRS   topiramate (TOPAMAX) 50 MG tablet Take 3 tablets in AM, 4 tablets at bedtime   [DISCONTINUED] hydrOXYzine (ATARAX/VISTARIL) 25 MG tablet Take 1 tablet (25 mg total) by mouth 3 (three) times daily as needed for anxiety or itching.    Allergies: Patient has No Known Allergies. Family History: Patient family history  includes Anxiety disorder in her daughter and son; Hearing loss in her son; Hyperlipidemia in her father; Hypertension in her father and mother; Other in her daughter. Social History:  Patient  reports that she has never smoked. She has never used smokeless tobacco. She reports that she does not drink alcohol and does not use drugs.  Review of Systems: Constitutional: Negative for fever malaise or anorexia Cardiovascular: negative for chest pain Respiratory: negative for SOB or persistent cough Gastrointestinal: negative for abdominal pain  Objective  Vitals: BP 120/78    Pulse 90    Temp 98.1 F (36.7 C) (Temporal)    Ht 5\' 2"  (1.575 m)    Wt 162 lb 9.6 oz (73.8 kg)    LMP 07/12/2014 (LMP Unknown)    SpO2 97%    BMI 29.74 kg/m  General: no acute distress , A&Ox3, appears well HEENT: PEERL, conjunctiva normal, neck is supple Cardiovascular:  RRR without murmur or gallop.  No chest wall tenderness Respiratory:  Good breath sounds bilaterally, CTAB with normal respiratory effort Skin:  Warm, no rashes No lower extremity edema    Commons side effects, risks, benefits, and alternatives for medications and treatment plan prescribed today were discussed, and the patient expressed understanding of the given instructions. Patient is instructed to call or message via MyChart if he/she has any questions or concerns regarding our treatment plan. No barriers to understanding were identified. We discussed Red Flag symptoms and signs in detail. Patient expressed understanding regarding what to do in case of urgent or emergency type symptoms.  Medication list was reconciled, printed and provided to the patient in AVS. Patient instructions and summary information was reviewed with the patient as documented in the AVS. This note was prepared with assistance of Dragon voice recognition software. Occasional wrong-word or sound-a-like substitutions may have occurred due to the inherent limitations of voice  recognition software  This visit occurred during the SARS-CoV-2 public health emergency.  Safety protocols were in place, including screening questions prior to the visit, additional usage of staff PPE, and extensive cleaning of exam room while observing appropriate contact time as indicated for disinfecting solutions.

## 2021-05-17 ENCOUNTER — Other Ambulatory Visit (HOSPITAL_COMMUNITY): Payer: Self-pay

## 2021-06-23 ENCOUNTER — Other Ambulatory Visit: Payer: Self-pay | Admitting: Family Medicine

## 2021-06-24 ENCOUNTER — Ambulatory Visit (INDEPENDENT_AMBULATORY_CARE_PROVIDER_SITE_OTHER): Payer: No Typology Code available for payment source | Admitting: Physician Assistant

## 2021-06-24 ENCOUNTER — Ambulatory Visit (INDEPENDENT_AMBULATORY_CARE_PROVIDER_SITE_OTHER): Payer: No Typology Code available for payment source

## 2021-06-24 ENCOUNTER — Other Ambulatory Visit: Payer: Self-pay | Admitting: Physician Assistant

## 2021-06-24 ENCOUNTER — Encounter: Payer: Self-pay | Admitting: Physician Assistant

## 2021-06-24 ENCOUNTER — Ambulatory Visit (HOSPITAL_BASED_OUTPATIENT_CLINIC_OR_DEPARTMENT_OTHER)
Admission: RE | Admit: 2021-06-24 | Discharge: 2021-06-24 | Disposition: A | Discharge status: Home / Self Care | Payer: No Typology Code available for payment source | Source: Ambulatory Visit | Attending: Physician Assistant | Admitting: Physician Assistant

## 2021-06-24 VITALS — BP 90/70 | HR 75 | Temp 98.2°F | Ht 62.0 in | Wt 164.0 lb

## 2021-06-24 DIAGNOSIS — E782 Mixed hyperlipidemia: Secondary | ICD-10-CM

## 2021-06-24 DIAGNOSIS — R29818 Other symptoms and signs involving the nervous system: Secondary | ICD-10-CM | POA: Insufficient documentation

## 2021-06-24 DIAGNOSIS — D329 Benign neoplasm of meninges, unspecified: Secondary | ICD-10-CM

## 2021-06-24 DIAGNOSIS — G40209 Localization-related (focal) (partial) symptomatic epilepsy and epileptic syndromes with complex partial seizures, not intractable, without status epilepticus: Secondary | ICD-10-CM

## 2021-06-24 DIAGNOSIS — R079 Chest pain, unspecified: Secondary | ICD-10-CM

## 2021-06-24 DIAGNOSIS — G9389 Other specified disorders of brain: Secondary | ICD-10-CM | POA: Insufficient documentation

## 2021-06-24 DIAGNOSIS — R42 Dizziness and giddiness: Secondary | ICD-10-CM

## 2021-06-24 LAB — COMPREHENSIVE METABOLIC PANEL
ALT: 18 U/L (ref 0–35)
AST: 18 U/L (ref 0–37)
Albumin: 4.4 g/dL (ref 3.5–5.2)
Alkaline Phosphatase: 74 U/L (ref 39–117)
BUN: 13 mg/dL (ref 6–23)
CO2: 25 mEq/L (ref 19–32)
Calcium: 9.4 mg/dL (ref 8.4–10.5)
Chloride: 106 mEq/L (ref 96–112)
Creatinine, Ser: 0.75 mg/dL (ref 0.40–1.20)
GFR: 88.36 mL/min (ref >60.00)
Glucose, Bld: 91 mg/dL (ref 70–99)
Potassium: 3.8 mEq/L (ref 3.5–5.1)
Sodium: 138 mEq/L (ref 135–145)
Total Bilirubin: 0.5 mg/dL (ref 0.2–1.2)
Total Protein: 7.1 g/dL (ref 6.0–8.3)

## 2021-06-24 LAB — LIPID PANEL
Cholesterol: 276 mg/dL — ABNORMAL HIGH (ref 0–200)
HDL: 50.6 mg/dL (ref >39.00)
LDL Cholesterol: 203 mg/dL — ABNORMAL HIGH (ref 0–99)
NonHDL: 225.66
Total CHOL/HDL Ratio: 5
Triglycerides: 114 mg/dL (ref 0.0–149.0)
VLDL: 22.8 mg/dL (ref 0.0–40.0)

## 2021-06-24 LAB — CBC WITH DIFFERENTIAL/PLATELET
Basophils Absolute: 0 10*3/uL (ref 0.0–0.1)
Basophils Relative: 0.5 % (ref 0.0–3.0)
Eosinophils Absolute: 0.1 10*3/uL (ref 0.0–0.7)
Eosinophils Relative: 1.3 % (ref 0.0–5.0)
HCT: 41.1 % (ref 36.0–46.0)
Hemoglobin: 13.7 g/dL (ref 12.0–15.0)
Lymphocytes Relative: 41.4 % (ref 12.0–46.0)
Lymphs Abs: 1.8 10*3/uL (ref 0.7–4.0)
MCHC: 33.3 g/dL (ref 30.0–36.0)
MCV: 89.5 fl (ref 78.0–100.0)
Monocytes Absolute: 0.2 10*3/uL (ref 0.1–1.0)
Monocytes Relative: 5.6 % (ref 3.0–12.0)
Neutro Abs: 2.3 10*3/uL (ref 1.4–7.7)
Neutrophils Relative %: 51.2 % (ref 43.0–77.0)
Platelets: 269 10*3/uL (ref 150.0–400.0)
RBC: 4.6 Mil/uL (ref 3.87–5.11)
RDW: 13 % (ref 11.5–15.5)
WBC: 4.4 10*3/uL (ref 4.0–10.5)

## 2021-06-24 LAB — TSH: TSH: 1.7 u[IU]/mL (ref 0.35–5.50)

## 2021-06-24 LAB — HEMOGLOBIN A1C: Hgb A1c MFr Bld: 5.8 % (ref 4.6–6.5)

## 2021-06-24 IMAGING — MR MR MRA NECK WO/W CM
5 series · 40 of 48 positions shown · IV contrast (gadavist)
Comparison: MR head [DATE]

CLINICAL DATA: Neuro deficit, dizziness for 3 days history of brain
tumor removal

EXAM:
MRI HEAD WITHOUT CONTRAST
MRA HEAD WITHOUT CONTRAST
MRA NECK WITHOUT AND WITH CONTRAST
TECHNIQUE: Multiplanar, multi-echo pulse sequences of the brain and surrounding
structures were acquired without intravenous contrast. Angiographic
images of the Circle of Willis were acquired using MRA technique
without intravenous contrast. Angiographic images of the neck were
acquired using MRA technique without and with intravenous contrast.
Carotid stenosis measurements (when applicable) are obtained
utilizing NASCET criteria, using the distal internal carotid
diameter as the denominator.
CONTRAST:  7.4mL GADAVIST GADOBUTROL 1 MMOL/ML IV SOLN

[Series 3: TOF · axial · 3.0mm · 0.86mm/px · z∈[-135,+24]mm · 10 of 80 slices shown]
[im 1/80]
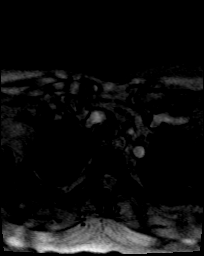
[im 9/80]
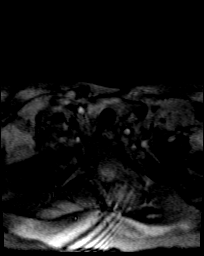
[im 18/80]
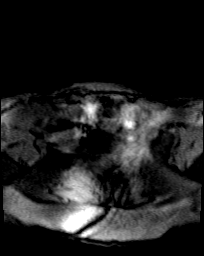
[im 27/80]
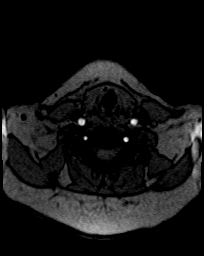
[im 36/80]
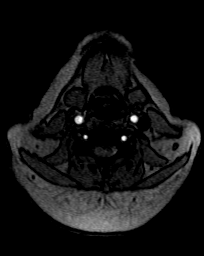
[im 44/80]
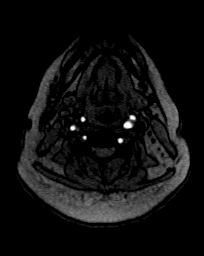
[im 53/80]
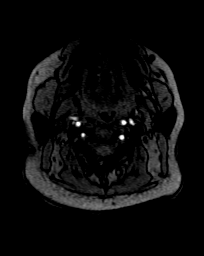
[im 62/80]
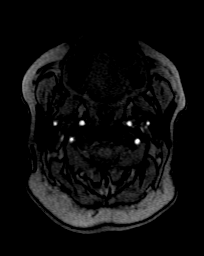
[im 71/80]
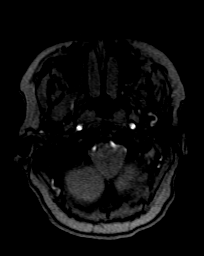
[im 80/80]
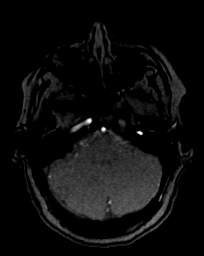

[Series 6: 2d tof_mip_tra · axial · 161.8mm · 0.86mm/px · 1 of 1 slices shown]
[im 1/1]
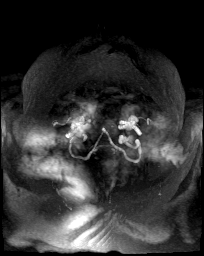

[Series 7: (id)_pre · coronal · 0.7mm · 0.78mm/px · 12 of 96 slices shown]
[im 1/96]
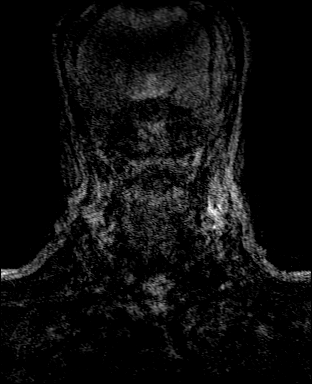
[im 8/96]
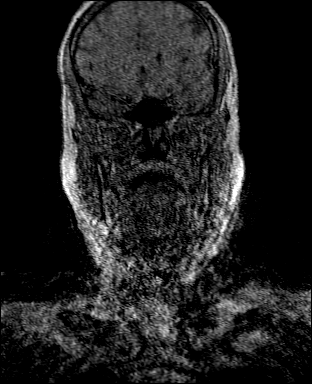
[im 16/96]
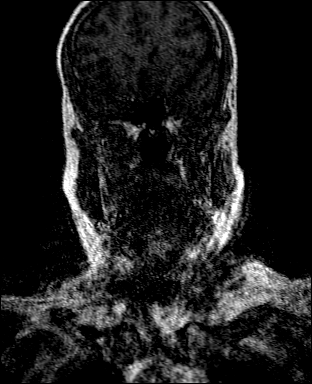
[im 24/96]
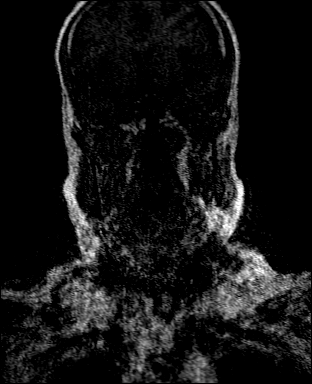
[im 32/96]
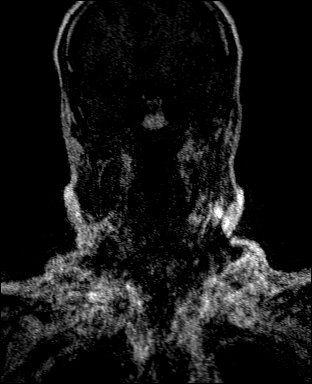
[im 40/96]
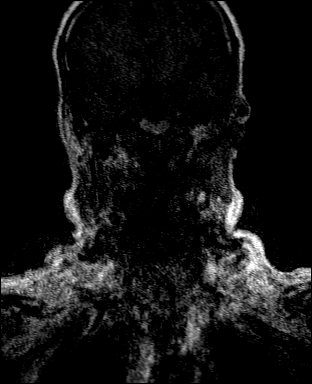
[im 48/96]
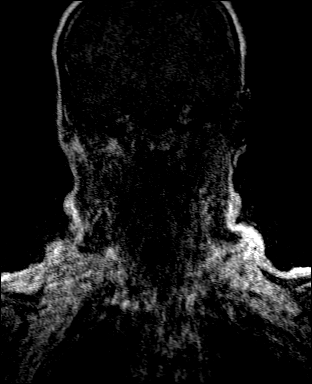
[im 56/96]
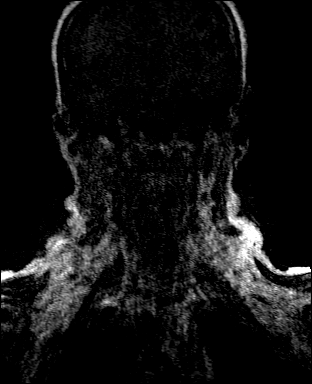
[im 64/96]
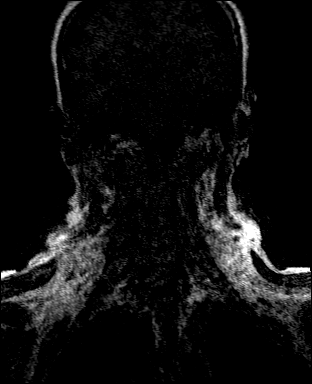
[im 72/96]
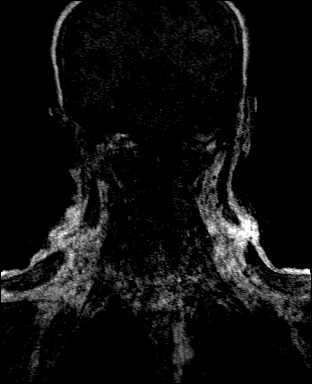
[im 80/96]
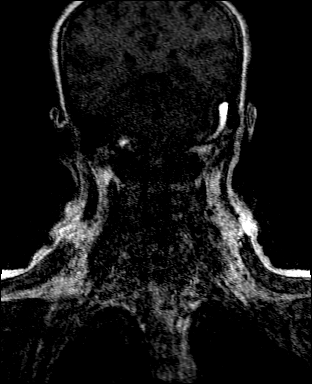
[im 96/96]
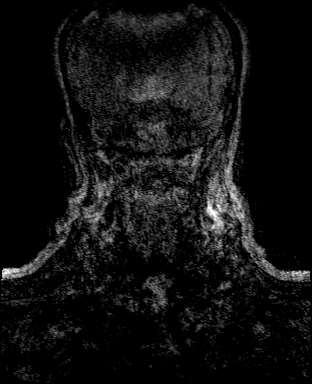

[Series 9: (id)_post · coronal · 0.7mm · 0.78mm/px · 9 of 90 slices shown]
[im 1/90]
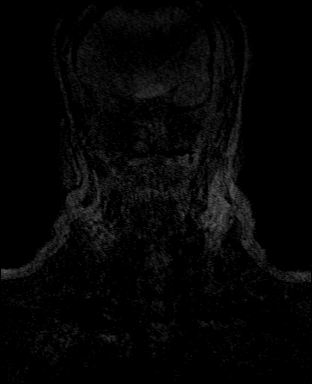
[im 17/90]
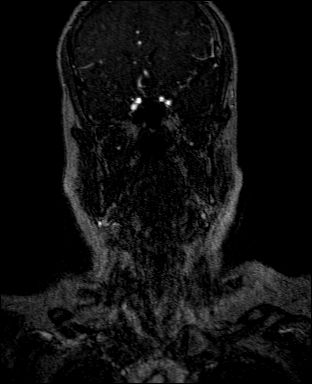
[im 25/90]
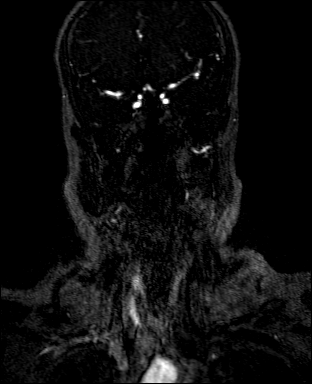
[im 41/90]
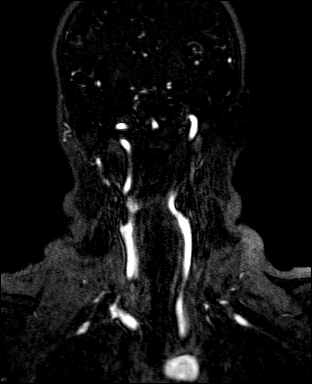
[im 49/90]
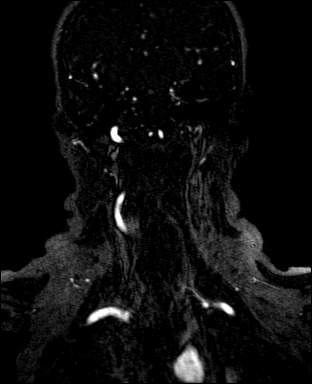
[im 65/90]
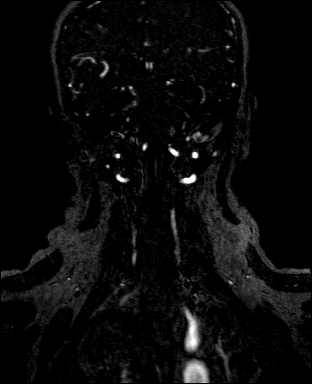
[im 73/90]
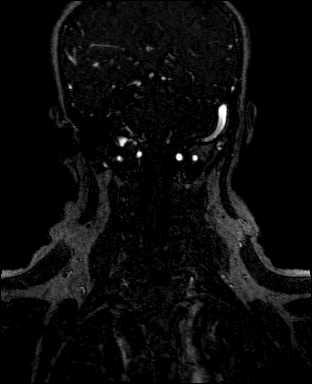
[im 81/90]
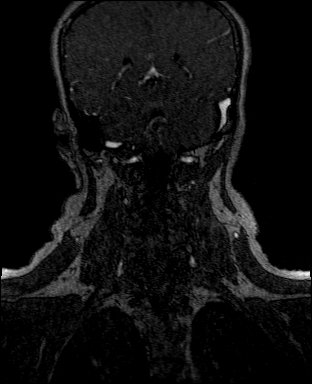
[im 90/90]
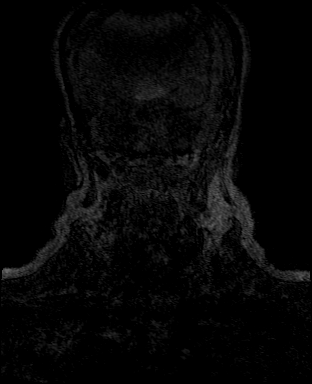

[Series 10: (id)_post_sub · coronal · 0.7mm · 0.78mm/px · 8 of 87 slices shown]
[im 1/87]
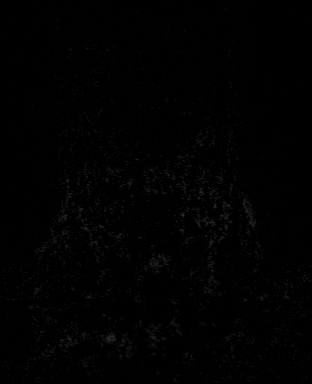
[im 16/87]
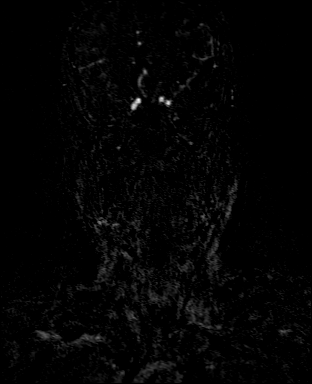
[im 24/87]
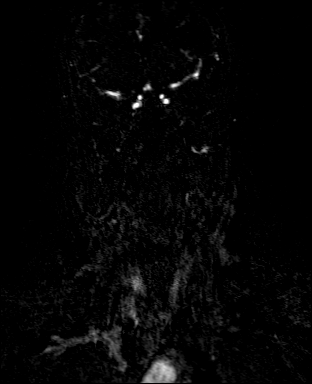
[im 40/87]
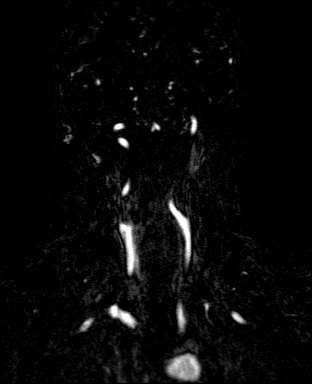
[im 47/87]
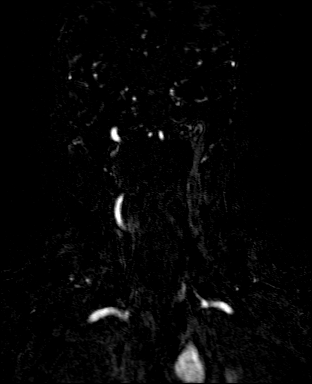
[im 63/87]
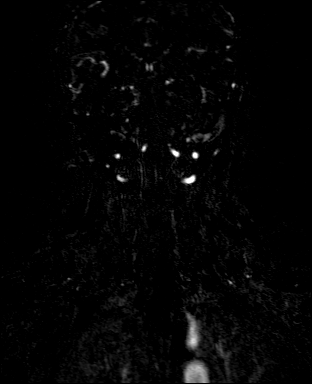
[im 71/87]
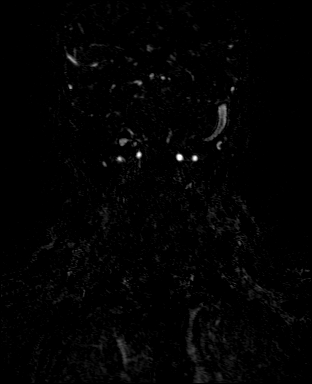
[im 87/87]
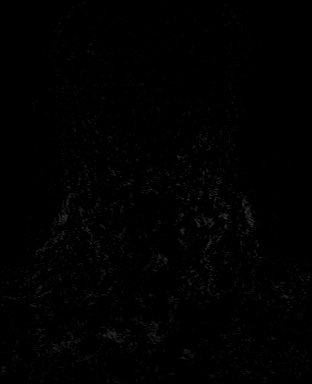

[40 of 48 positions shown; findings below may reference images not displayed]

FINDINGS: MRI HEAD FINDINGS

Brain: There is no evidence of acute intracranial hemorrhage,
extra-axial fluid collection, or acute infarct.

There is a small focus of encephalomalacia in the left parietal
lobe, unchanged. Otherwise, parenchymal volume is normal. The
ventricles are normal in size. Parenchymal signal is normal, with no
significant burden of white matter microangiopathic change.

There is an extra-axial lesion in the right posterior fossa
measuring 1.3 cm TV x 1.1 cm AP on the axial noncontrast T1
sequence, unchanged compared to the study from [G1] and likely
reflecting a meningioma. There is mild mass effect on the underlying
brain parenchyma with no parenchymal signal abnormality.

There is no other solid mass lesion.  There is no midline shift.

Vascular: The major intracranial flow voids are present. The
vasculature is assessed in full below.

Skull and upper cervical spine: Normal marrow signal.

Sinuses/Orbits: The paranasal sinuses are clear. The globes and
orbits are unremarkable.

Other: None.

MRA HEAD FINDINGS

Anterior circulation: The intracranial ICAs are patent.

The bilateral MCAs are patent.

The bilateral ACAs are patent. The anterior communicating artery is
normal.

There is a 2 mm inferiorly directed outpouching arising from the
communicating segment of the right ICA (see key image, 3-118,
102-16). There is a 1-2 mm laterally directed outpouching arising
from the communicating segment of the left ICA (3-117). These
outpouchings are favored to reflect infundibula as opposed to small
aneurysms.

Posterior circulation: The bilateral V4 segments are patent. The
PICA origin is seen on the left but not well seen on the right. The
basilar artery is patent.

The bilateral PCAs are patent.

There is no aneurysm or AVM.

Anatomic variants: None.

MRA NECK FINDINGS

Aortic arch: The imaged aortic arch is normal. The origins of the
major branch vessels are patent. The subclavian arteries are patent
to the level imaged.

Right carotid system: The right common, internal, and external
carotid arteries are patent, without evidence of hemodynamically
significant stenosis or occlusion. There is no evidence of
dissection or aneurysm.

Left carotid system: The left common, internal, and external carotid
arteries are patent, without evidence of hemodynamically significant
stenosis or occlusion. There is no evidence of dissection or
aneurysm.

Vertebral arteries: The vertebral arteries are patent with antegrade
flow. There is no evidence of hemodynamically significant stenosis
or occlusion. There is no evidence of dissection or aneurysm.

Other: None
IMPRESSION: 1. No acute intracranial pathology. Stable encephalomalacia in the
left parietal lobe and stable right posterior fossa meningioma
compared to the study from [DATE].
2. Patent vasculature of the head and neck with no hemodynamically
significant stenosis or occlusion.
3. Small (1-2 mm) outpouchings arising from the communicating
segments of the ICAs bilaterally are favored to reflect infundibula
as opposed to small aneurysms.

## 2021-06-24 IMAGING — MR MR HEAD W/O CM
10 series · 48 of 48 positions shown · IV contrast (gadavist)
Comparison: MR head [DATE]

CLINICAL DATA: Neuro deficit, dizziness for 3 days history of brain
tumor removal

EXAM:
MRI HEAD WITHOUT CONTRAST
MRA HEAD WITHOUT CONTRAST
MRA NECK WITHOUT AND WITH CONTRAST
TECHNIQUE: Multiplanar, multi-echo pulse sequences of the brain and surrounding
structures were acquired without intravenous contrast. Angiographic
images of the Circle of Willis were acquired using MRA technique
without intravenous contrast. Angiographic images of the neck were
acquired using MRA technique without and with intravenous contrast.
Carotid stenosis measurements (when applicable) are obtained
utilizing NASCET criteria, using the distal internal carotid
diameter as the denominator.
CONTRAST:  7.4mL GADAVIST GADOBUTROL 1 MMOL/ML IV SOLN

[Series 1: T1 · sagittal · 5.0mm · 0.45mm/px · 2 of 23 slices shown (1 of 2)]
[im 1/23]
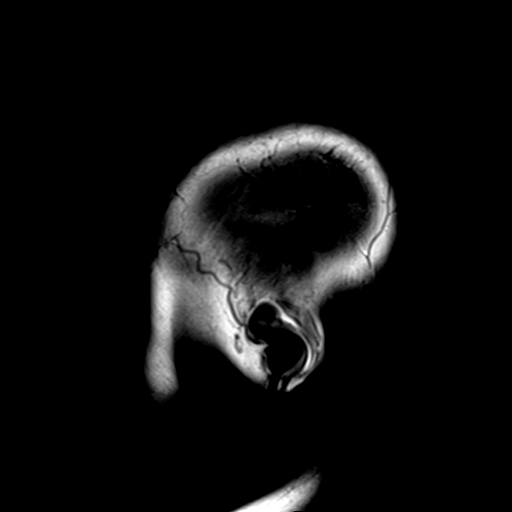
[im 23/23]
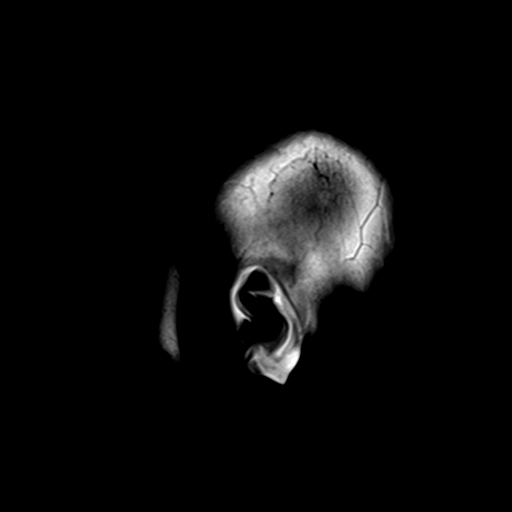

[Series 2: DWI · axial · 3.0mm · 1.25mm/px · z∈[-9,+153]mm · 9 of 110 slices shown (1 of 4)]
[im 1/110]
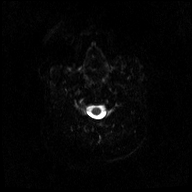
[im 14/110]
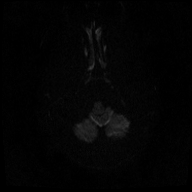
[im 28/110]
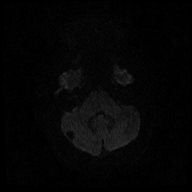
[im 41/110]
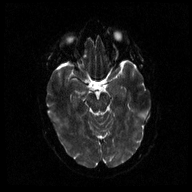
[im 55/110]
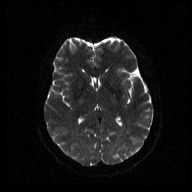
[im 69/110]
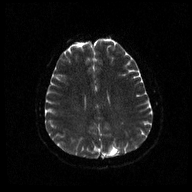
[im 82/110]
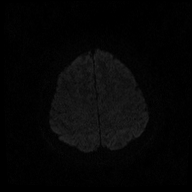
[im 96/110]
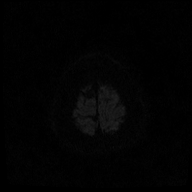
[im 110/110]
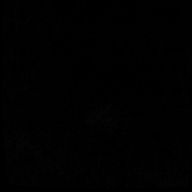

[Series 3: DWI · axial · 3.0mm · 1.25mm/px · z∈[-9,+153]mm · 4 of 55 slices shown (2 of 4)]
[im 1/55]
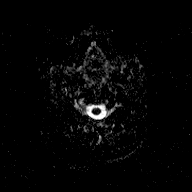
[im 19/55]
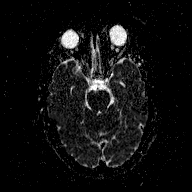
[im 37/55]
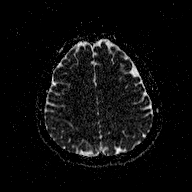
[im 55/55]
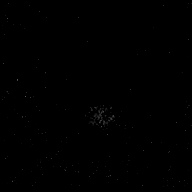

[Series 4: DWI · coronal · 5.0mm · 1.25mm/px · 5 of 64 slices shown (3 of 4)]
[im 1/64]
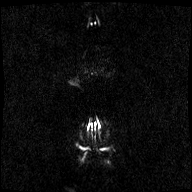
[im 16/64]
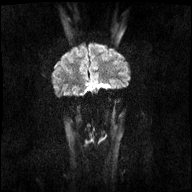
[im 32/64]
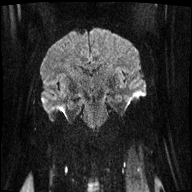
[im 48/64]
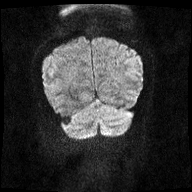
[im 64/64]
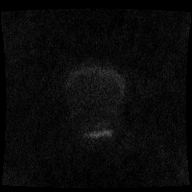

[Series 5: DWI · coronal · 5.0mm · 1.25mm/px · 3 of 32 slices shown (4 of 4)]
[im 1/32]
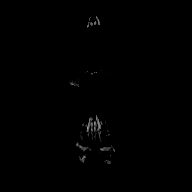
[im 16/32]
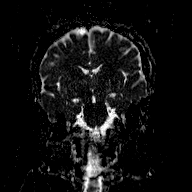
[im 32/32]
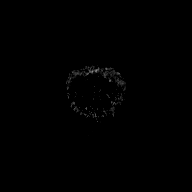

[Series 6: T2 · axial · 5.0mm · 0.72mm/px · z∈[-3,+150]mm · 2 of 23 slices shown (1 of 3)]
[im 1/23]
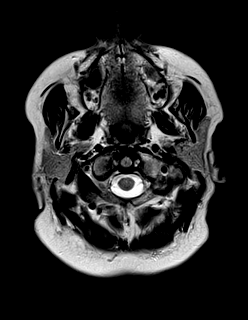
[im 23/23]
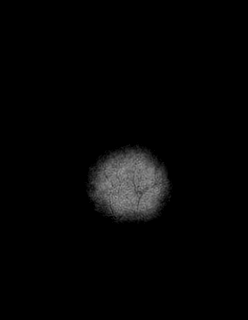

[Series 7: FLAIR · axial · 3.0mm · 0.45mm/px · z∈[-9,+153]mm · 4 of 55 slices shown]
[im 1/55]
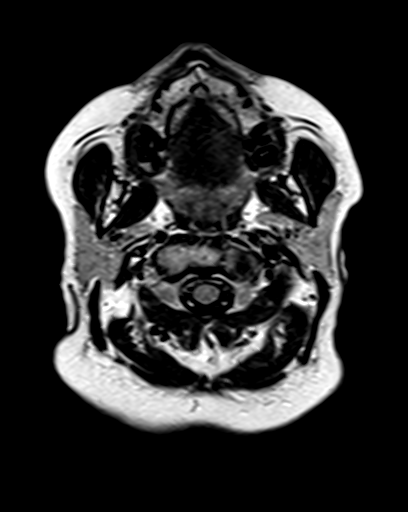
[im 19/55]
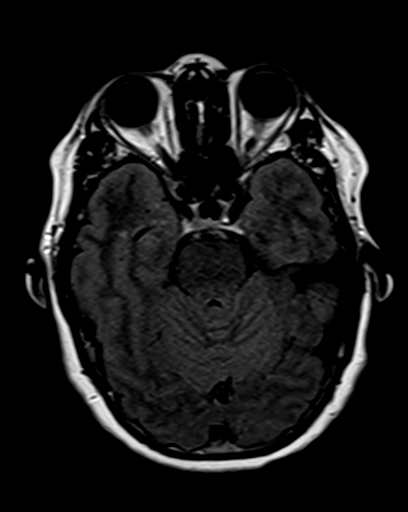
[im 37/55]
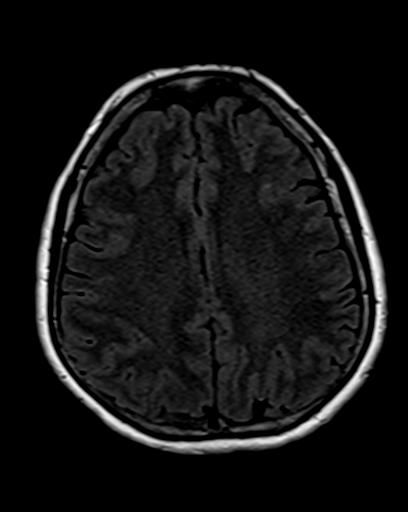
[im 55/55]
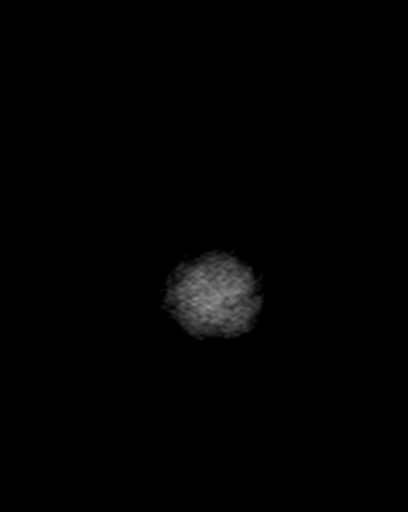

[Series 8: T2 · axial · 5.0mm · 0.72mm/px · z∈[-3,+150]mm · 2 of 23 slices shown (2 of 3)]
[im 1/23]
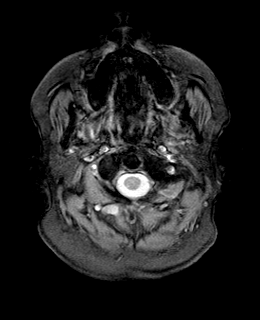
[im 23/23]
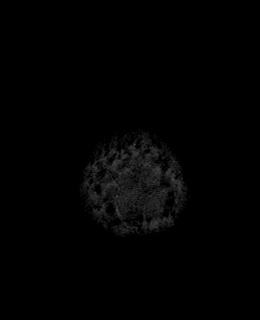

[Series 9: T1 · axial · 1.0mm · 0.94mm/px · z∈[-15,+159]mm · 14 of 176 slices shown (2 of 2)]
[im 1/176]
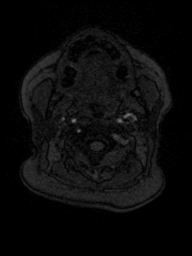
[im 14/176]
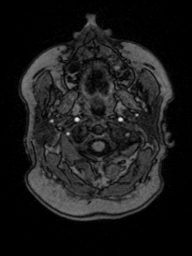
[im 27/176]
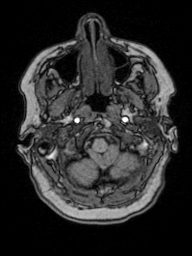
[im 41/176]
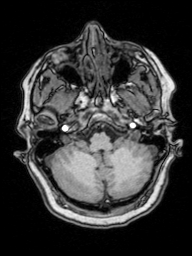
[im 54/176]
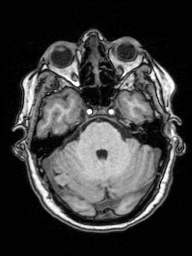
[im 68/176]
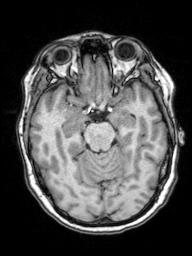
[im 81/176]
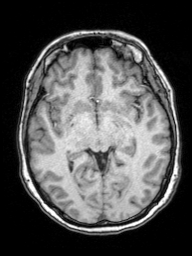
[im 95/176]
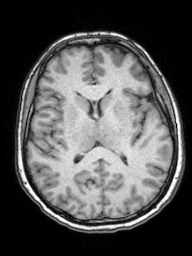
[im 108/176]
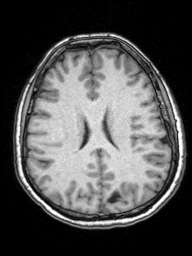
[im 122/176]
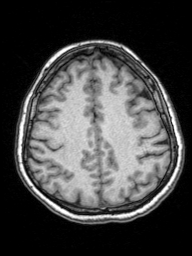
[im 135/176]
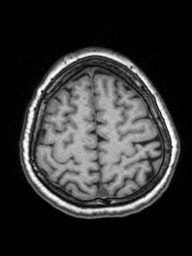
[im 149/176]
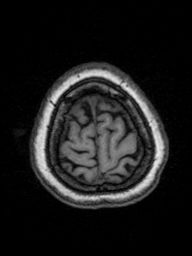
[im 162/176]
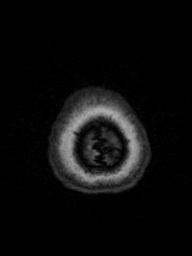
[im 176/176]
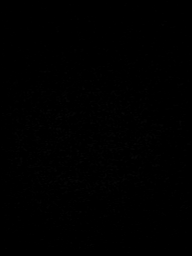

[Series 10: T2 · coronal · 5.0mm · 0.43mm/px · 3 of 31 slices shown (3 of 3)]
[im 1/31]
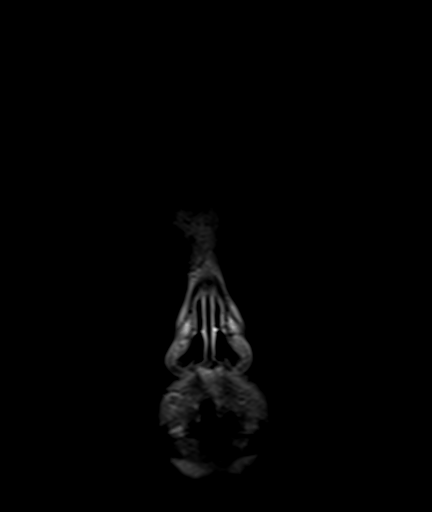
[im 16/31]
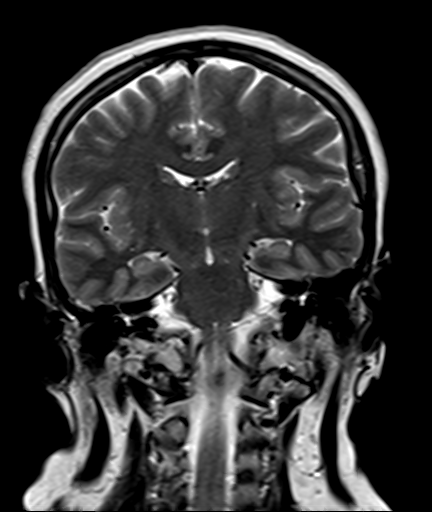
[im 31/31]
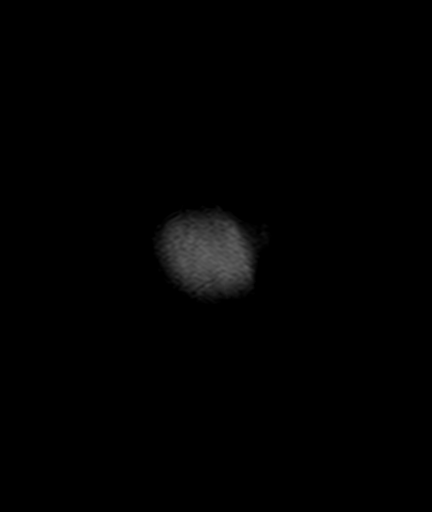

[48 of 48 positions shown; findings below may reference images not displayed]

FINDINGS: MRI HEAD FINDINGS

Brain: There is no evidence of acute intracranial hemorrhage,
extra-axial fluid collection, or acute infarct.

There is a small focus of encephalomalacia in the left parietal
lobe, unchanged. Otherwise, parenchymal volume is normal. The
ventricles are normal in size. Parenchymal signal is normal, with no
significant burden of white matter microangiopathic change.

There is an extra-axial lesion in the right posterior fossa
measuring 1.3 cm TV x 1.1 cm AP on the axial noncontrast T1
sequence, unchanged compared to the study from [G1] and likely
reflecting a meningioma. There is mild mass effect on the underlying
brain parenchyma with no parenchymal signal abnormality.

There is no other solid mass lesion.  There is no midline shift.

Vascular: The major intracranial flow voids are present. The
vasculature is assessed in full below.

Skull and upper cervical spine: Normal marrow signal.

Sinuses/Orbits: The paranasal sinuses are clear. The globes and
orbits are unremarkable.

Other: None.

MRA HEAD FINDINGS

Anterior circulation: The intracranial ICAs are patent.

The bilateral MCAs are patent.

The bilateral ACAs are patent. The anterior communicating artery is
normal.

There is a 2 mm inferiorly directed outpouching arising from the
communicating segment of the right ICA (see key image, 3-118,
102-16). There is a 1-2 mm laterally directed outpouching arising
from the communicating segment of the left ICA (3-117). These
outpouchings are favored to reflect infundibula as opposed to small
aneurysms.

Posterior circulation: The bilateral V4 segments are patent. The
PICA origin is seen on the left but not well seen on the right. The
basilar artery is patent.

The bilateral PCAs are patent.

There is no aneurysm or AVM.

Anatomic variants: None.

MRA NECK FINDINGS

Aortic arch: The imaged aortic arch is normal. The origins of the
major branch vessels are patent. The subclavian arteries are patent
to the level imaged.

Right carotid system: The right common, internal, and external
carotid arteries are patent, without evidence of hemodynamically
significant stenosis or occlusion. There is no evidence of
dissection or aneurysm.

Left carotid system: The left common, internal, and external carotid
arteries are patent, without evidence of hemodynamically significant
stenosis or occlusion. There is no evidence of dissection or
aneurysm.

Vertebral arteries: The vertebral arteries are patent with antegrade
flow. There is no evidence of hemodynamically significant stenosis
or occlusion. There is no evidence of dissection or aneurysm.

Other: None
IMPRESSION: 1. No acute intracranial pathology. Stable encephalomalacia in the
left parietal lobe and stable right posterior fossa meningioma
compared to the study from [DATE].
2. Patent vasculature of the head and neck with no hemodynamically
significant stenosis or occlusion.
3. Small (1-2 mm) outpouchings arising from the communicating
segments of the ICAs bilaterally are favored to reflect infundibula
as opposed to small aneurysms.

## 2021-06-24 IMAGING — MR MR MRA HEAD W/O CM
1 series · 17 of 48 positions shown · IV contrast (gadavist)
Comparison: MR head [DATE]

CLINICAL DATA: Neuro deficit, dizziness for 3 days history of brain
tumor removal

EXAM:
MRI HEAD WITHOUT CONTRAST
MRA HEAD WITHOUT CONTRAST
MRA NECK WITHOUT AND WITH CONTRAST
TECHNIQUE: Multiplanar, multi-echo pulse sequences of the brain and surrounding
structures were acquired without intravenous contrast. Angiographic
images of the Circle of Willis were acquired using MRA technique
without intravenous contrast. Angiographic images of the neck were
acquired using MRA technique without and with intravenous contrast.
Carotid stenosis measurements (when applicable) are obtained
utilizing NASCET criteria, using the distal internal carotid
diameter as the denominator.
CONTRAST:  7.4mL GADAVIST GADOBUTROL 1 MMOL/ML IV SOLN

[Series 3: tof_3d_multi-slab · axial · 0.5mm · 0.39mm/px · z∈[-6,+104]mm · 17 of 232 slices shown]
[im 1/232]
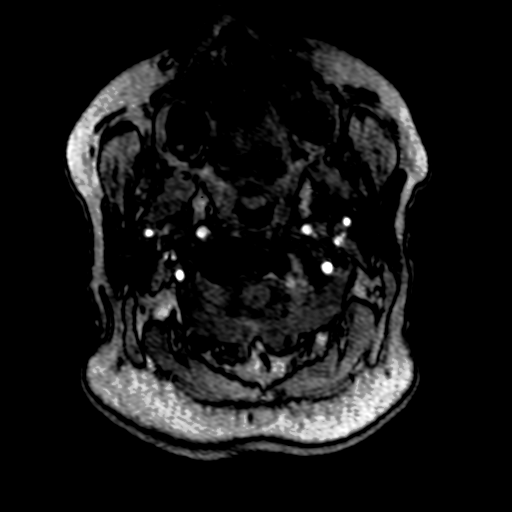
[im 5/232]
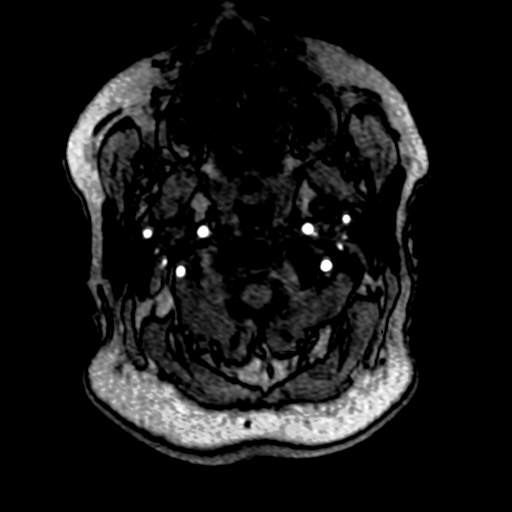
[im 10/232]
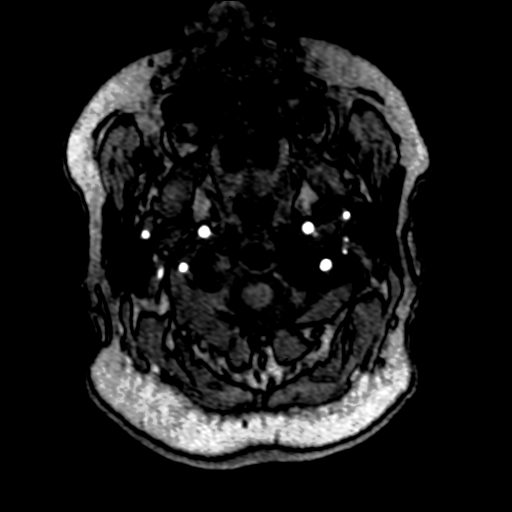
[im 15/232]
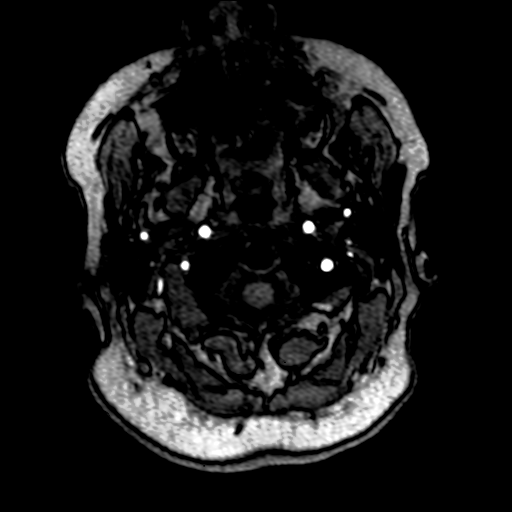
[im 20/232]
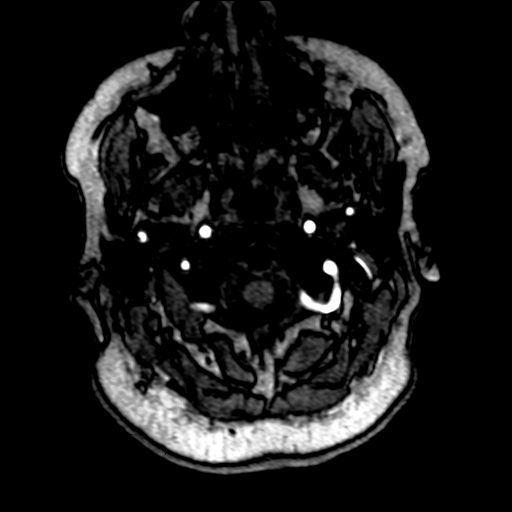
[im 25/232]
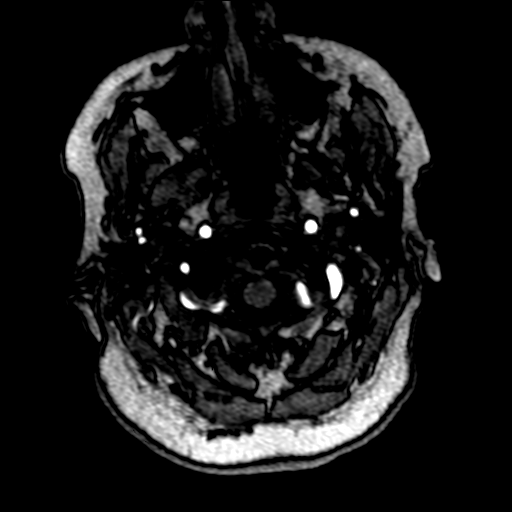
[im 30/232]
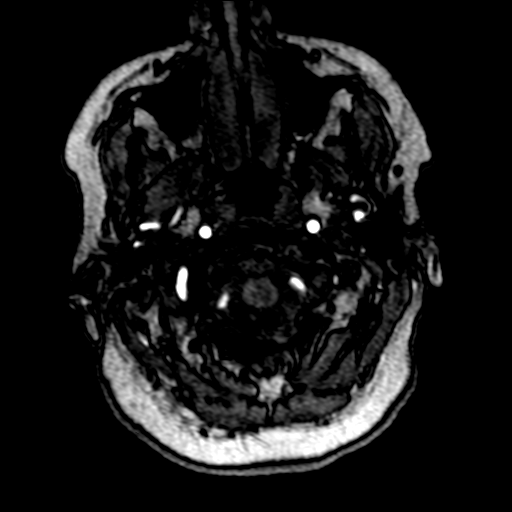
[im 40/232]
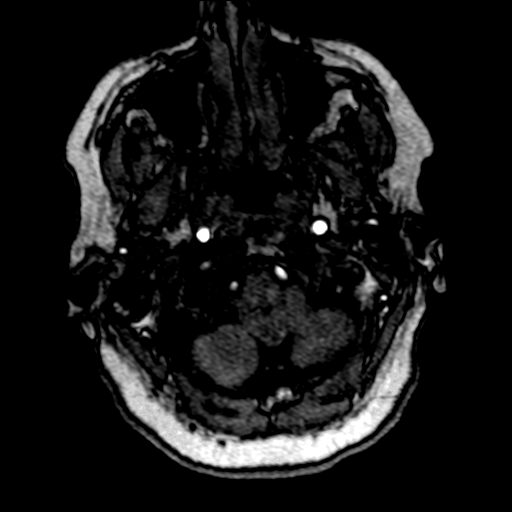
[im 45/232]
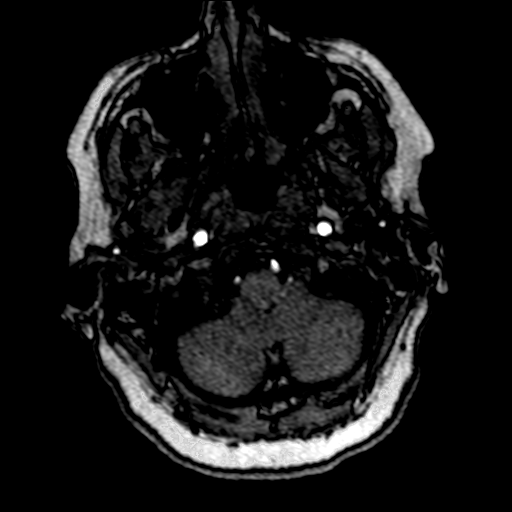
[im 74/232]
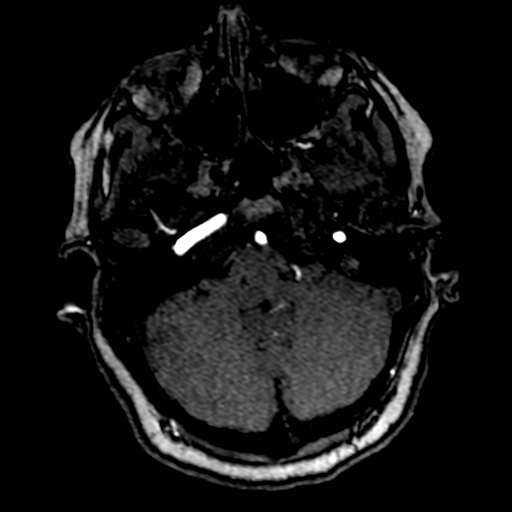
[im 104/232]
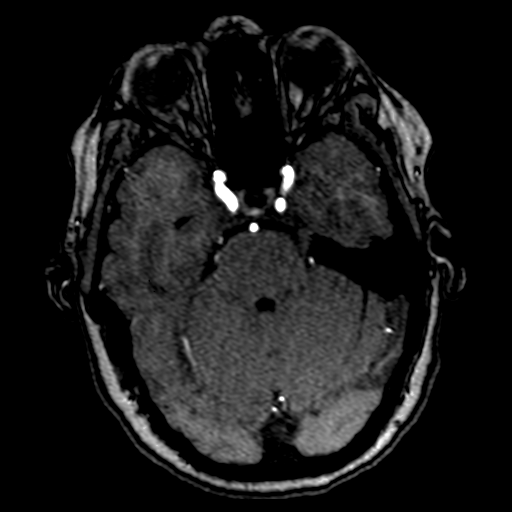
[im 118/232]
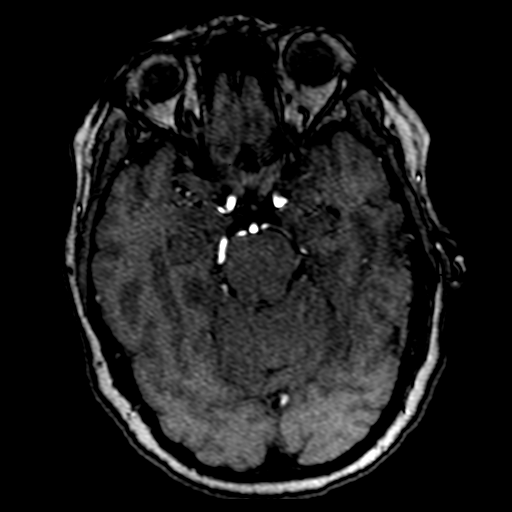
[im 133/232]
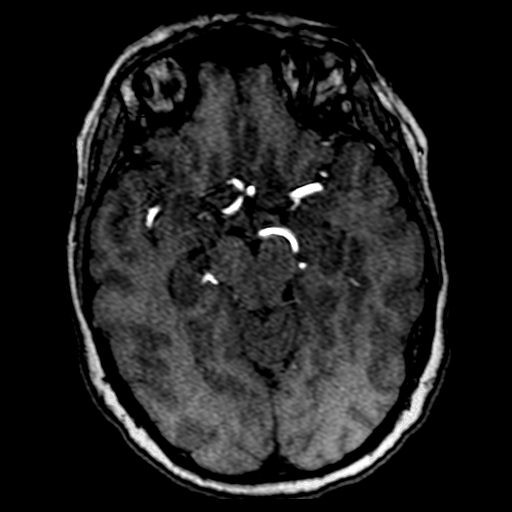
[im 163/232]
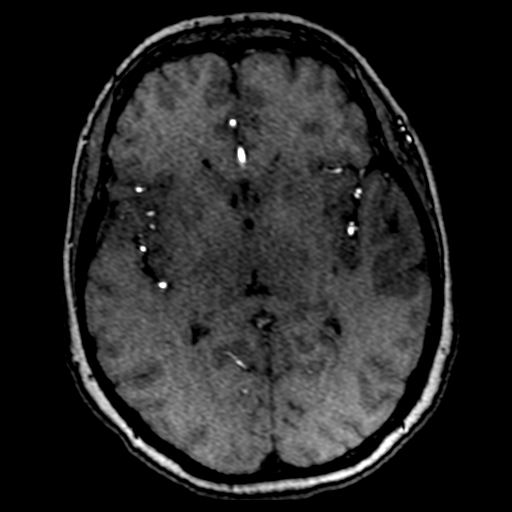
[im 192/232]
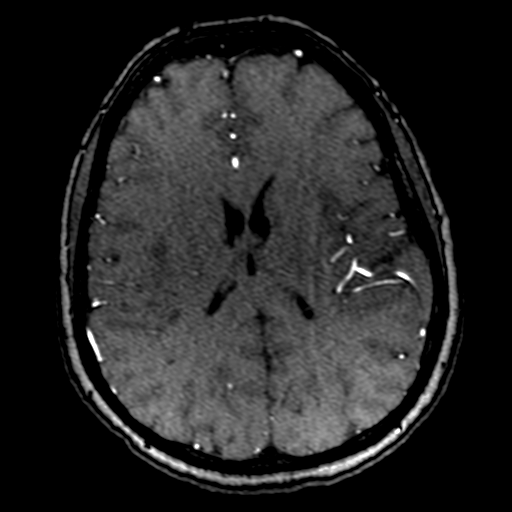
[im 197/232]
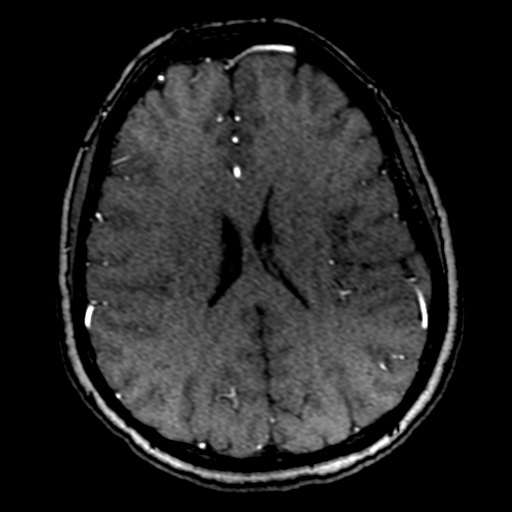
[im 222/232]
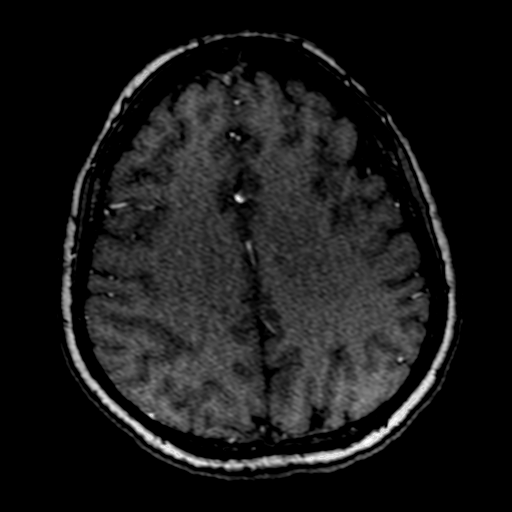

[17 of 48 positions shown; findings below may reference images not displayed]

FINDINGS: MRI HEAD FINDINGS

Brain: There is no evidence of acute intracranial hemorrhage,
extra-axial fluid collection, or acute infarct.

There is a small focus of encephalomalacia in the left parietal
lobe, unchanged. Otherwise, parenchymal volume is normal. The
ventricles are normal in size. Parenchymal signal is normal, with no
significant burden of white matter microangiopathic change.

There is an extra-axial lesion in the right posterior fossa
measuring 1.3 cm TV x 1.1 cm AP on the axial noncontrast T1
sequence, unchanged compared to the study from [G1] and likely
reflecting a meningioma. There is mild mass effect on the underlying
brain parenchyma with no parenchymal signal abnormality.

There is no other solid mass lesion.  There is no midline shift.

Vascular: The major intracranial flow voids are present. The
vasculature is assessed in full below.

Skull and upper cervical spine: Normal marrow signal.

Sinuses/Orbits: The paranasal sinuses are clear. The globes and
orbits are unremarkable.

Other: None.

MRA HEAD FINDINGS

Anterior circulation: The intracranial ICAs are patent.

The bilateral MCAs are patent.

The bilateral ACAs are patent. The anterior communicating artery is
normal.

There is a 2 mm inferiorly directed outpouching arising from the
communicating segment of the right ICA (see key image, 3-118,
102-16). There is a 1-2 mm laterally directed outpouching arising
from the communicating segment of the left ICA (3-117). These
outpouchings are favored to reflect infundibula as opposed to small
aneurysms.

Posterior circulation: The bilateral V4 segments are patent. The
PICA origin is seen on the left but not well seen on the right. The
basilar artery is patent.

The bilateral PCAs are patent.

There is no aneurysm or AVM.

Anatomic variants: None.

MRA NECK FINDINGS

Aortic arch: The imaged aortic arch is normal. The origins of the
major branch vessels are patent. The subclavian arteries are patent
to the level imaged.

Right carotid system: The right common, internal, and external
carotid arteries are patent, without evidence of hemodynamically
significant stenosis or occlusion. There is no evidence of
dissection or aneurysm.

Left carotid system: The left common, internal, and external carotid
arteries are patent, without evidence of hemodynamically significant
stenosis or occlusion. There is no evidence of dissection or
aneurysm.

Vertebral arteries: The vertebral arteries are patent with antegrade
flow. There is no evidence of hemodynamically significant stenosis
or occlusion. There is no evidence of dissection or aneurysm.

Other: None
IMPRESSION: 1. No acute intracranial pathology. Stable encephalomalacia in the
left parietal lobe and stable right posterior fossa meningioma
compared to the study from [DATE].
2. Patent vasculature of the head and neck with no hemodynamically
significant stenosis or occlusion.
3. Small (1-2 mm) outpouchings arising from the communicating
segments of the ICAs bilaterally are favored to reflect infundibula
as opposed to small aneurysms.

## 2021-06-24 MED ORDER — ATORVASTATIN CALCIUM 20 MG PO TABS: 20.0000 mg | ORAL_TABLET | Freq: Every day | ORAL | 3 refills | Status: DC

## 2021-06-24 MED ORDER — GADOBUTROL 1 MMOL/ML IV SOLN
7.4000 mL | Freq: Once | INTRAVENOUS | Status: AC | PRN
  Administered 2021-06-24: 7.4 mL via INTRAVENOUS
  Filled 2021-06-24: qty 7.5

## 2021-06-24 NOTE — Patient Instructions (Addendum)
It was great to see you! ? ?We are getting more tests to evaluate what's going on ? ?We are checking blood work today ? ?We are going to obtain imaging to look at your brain and the vessels that supply blood to your brain and your neck to look for any abnormalities ? ?We are also going look at your heart with an ultrasound and monitor ? ?We will be in touch with all results ? ?If any new/worsening symptoms, please go to the ER IMMEDIATELY ? ?Take care, ? ?Inda Coke PA-C  ?

## 2021-06-24 NOTE — Progress Notes (Signed)
Victoria Bishop is a 56 y.o. female here for dizziness. ? ?History of Present Illness:  ? ?Chief Complaint  ?Patient presents with  ? Dizziness  ?  Pt c/o dizziness x 3 days off and on. Denies headache or blurred vision. Chest pain 3 days ago, and left arm feels weak.  ? ?Memory Dance acted as an Veterinary surgeon for today's visit.  ? ?HPI ? ?Dizziness  ?Patient has complex medical history of migraine, meningioma, partial epilepsy currently followed by Dr. Ellouise Newer with [facility]. ? ?Donabelle presents with c/o intermittent dizziness that has been onset for three days. Pt describes feeling as though she was experiencing "brain fog" while she was shopping this past Wednesday. She goes on to compare the sensation to feeling as though her head couldn't breathe despite her being able to breathe clearly. To make matters worse, pt also began experiencing left sided chest pain as well as left arm weakness. When asked to describe the chest pain, she compared it to a tiny stabbing sensation. As far as her left arm weakness, she stated it was similar yet different to the type of arm weakness she experiences during an epileptic episode. Today, she feels much improved and sx resolved. ? ?She states that her sx she experienced earlier this week were not similar to past migraines or seizures. ? ?On 04/25/21, pt presented to Dr. Jerilee Hoh with c/o CP that had been onset for 4 days. Per Dr. Ledell Noss note, pt was experiencing substernal chest paid that she felt would radiate to her back around her shoulder blades. She was also experiencing dyspnea on exertion. As a result of this Babita underwent an EKG and stat CT for further evaluation. While the EKG was WNL, her CT did show mild right sided cardiomegaly. ? ?On 05/09/21, Pt followed up with Dr. Jonni Sanger, PCP, and stated she was feeling much better. Due to this pt was instructed to monitor sx and follow up if they return or worsen.  ? ? ? ? ?Past Medical History:  ?Diagnosis Date   ? Anxiety   ? Brain tumor [facility]) 2013  ? Depression   ? Epilepsy ([place])   ? GERD (gastroesophageal reflux disease)   ? Meningitis 2013  ? Migraines   ? Seizures ([place])   ? ?  ?Social History  ? ?Tobacco Use  ? Smoking status: Never  ? Smokeless tobacco: Never  ?Vaping Use  ? Vaping Use: Never used  ?Substance Use Topics  ? Alcohol use: No  ? Drug use: No  ? ? ?Past Surgical History:  ?Procedure Laterality Date  ? BRAIN SURGERY  2014  ? CESAREAN SECTION    ? CT RADIATION THERAPY GUIDE  2014  ? ? ?Family History  ?Problem Relation Age of Onset  ? Hypertension Mother   ? Hypertension Father   ? Hyperlipidemia Father   ? Anxiety disorder Daughter   ? Hearing loss Son   ? Anxiety disorder Son   ? Other Daughter   ?     vader syndrome  ? Diabetes Neg Hx   ? Cancer Neg Hx   ? ? ?No Known Allergies ? ?Current Medications:  ? ?Current Outpatient Medications:  ?  cyclobenzaprine (FLEXERIL) 10 MG tablet, Take 1 tablet (10 mg total) by mouth 3 (three) times daily as needed for muscle spasms., Disp: 30 tablet, Rfl: 1 ?  hydrOXYzine (ATARAX) 25 MG tablet, Take 1 tablet (25 mg total) by mouth 3 (three) times daily as needed for anxiety., Disp: 60 tablet,  Rfl: 5 ?  sertraline (ZOLOFT) 50 MG tablet, TAKE 3 TABLETS BY MOUTH AT BEDTIME, Disp: 270 tablet, Rfl: 0 ?  SUMAtriptan (IMITREX) 100 MG tablet, TAKE 1 TABLET BY MOUTH AT FIRST SIGN OF MIGRAINE-MAY REPEAT IN 2 HRS-MAX 2 TABS/24 HRS, Disp: 10 tablet, Rfl: 11 ?  topiramate (TOPAMAX) 50 MG tablet, Take 3 tablets in AM, 4 tablets at bedtime, Disp: 210 tablet, Rfl: 11 ?  gabapentin (NEURONTIN) 100 MG capsule, Take 1 capsule every night (Patient not taking: Reported on 06/24/2021), Disp: 30 capsule, Rfl: 11  ? ?Review of Systems:  ? ?ROS ?Negative unless otherwise specified per HPI. ?Vitals:  ? ?Vitals:  ? 06/24/21 1135  ?BP: 90/70  ?Pulse: 75  ?Temp: 98.2 ?F (36.8 ?C)  ?TempSrc: Temporal  ?SpO2: 96%  ?Weight: 164 lb (74.4 kg)  ?Height: '5\' 2"'$  (1.575 m)  ?   ?Body mass index is 30  kg/m?. ? ?Physical Exam:  ? ?Physical Exam ?Vitals and nursing note reviewed.  ?Constitutional:   ?   General: She is not in acute distress. ?   Appearance: She is well-developed. She is not ill-appearing or toxic-appearing.  ?Cardiovascular:  ?   Rate and Rhythm: Normal rate and regular rhythm.  ?   Pulses: Normal pulses.  ?   Heart sounds: Normal heart sounds, S1 normal and S2 normal.  ?Pulmonary:  ?   Effort: Pulmonary effort is normal.  ?   Breath sounds: Normal breath sounds.  ?Skin: ?   General: Skin is warm and dry.  ?Neurological:  ?   General: No focal deficit present.  ?   Mental Status: She is alert.  ?   GCS: GCS eye subscore is 4. GCS verbal subscore is 5. GCS motor subscore is 6.  ?   Cranial Nerves: Cranial nerves 2-12 are intact.  ?   Sensory: Sensation is intact.  ?   Motor: Motor function is intact.  ?   Coordination: Coordination is intact.  ?   Deep Tendon Reflexes: Reflexes are normal and symmetric.  ?Psychiatric:     ?   Speech: Speech normal.     ?   Behavior: Behavior normal. Behavior is cooperative.  ? ? ?Assessment and Plan:  ? ?Chest pain, unspecified type ?EKG tracing is personally reviewed.  EKG notes NSR.  No acute changes.  ?Asymptomatic today ?We are going to obtain echocardiogram and zio patch for further evaluation and management ? ?Localization-related symptomatic epilepsy and epileptic syndromes with complex partial seizures, not intractable, without status epilepticus ([place]); Meningioma ([place]) ?Stable per patient ?Notes reviewed from neuro ?Recommend close follow-up with neuro, cannot say that her recent episode was related to these dx's ? ?Mixed hyperlipidemia ?Update lipid panel and make recommendations accordingly ? ?Acute focal neurological deficit ?Asymptomatic at this time ?Ddx: TIA, complex migraine, complex seizure, change in meningioma, among others ?Obtain TIA work-up including: MRI of brain, MRA of head/neck, zio patch, echo, blood work ?Low threshold to have her  follow-up with neurology or establish with cardiology if any abnormality ?If any new acute sx arise -- she was advised to go to the ER ? ?I,Havlyn C Ratchford,acting as a scribe for [employer], PA.,have documented all relevant documentation on the behalf of Inda Coke, PA,as directed by  Inda Coke, PA while in the presence of Inda Coke, [place]. ? ?IInda Coke, PA, have reviewed all documentation for this visit. The documentation on 06/24/21 for the exam, diagnosis, procedures, and orders are all accurate and complete. ? ?  Time spent with patient today was 60 minutes which consisted of chart review of prior neurology and primary care notes, discussing diagnosis, work up, treatment answering questions and documentation. ? ?Inda Coke, PA-C ? ?

## 2021-06-24 NOTE — Progress Notes (Unsigned)
Enrolled for [employer] to mail a ZIO XT long term holter monitor to the patients address on file.  

## 2021-06-29 DIAGNOSIS — R42 Dizziness and giddiness: Secondary | ICD-10-CM

## 2021-06-29 DIAGNOSIS — R079 Chest pain, unspecified: Secondary | ICD-10-CM

## 2021-07-19 ENCOUNTER — Encounter: Payer: Self-pay | Admitting: Neurology

## 2021-07-27 ENCOUNTER — Ambulatory Visit (HOSPITAL_COMMUNITY): Payer: No Typology Code available for payment source

## 2021-08-05 ENCOUNTER — Ambulatory Visit (INDEPENDENT_AMBULATORY_CARE_PROVIDER_SITE_OTHER): Payer: No Typology Code available for payment source | Admitting: Neurology

## 2021-08-05 ENCOUNTER — Encounter: Payer: Self-pay | Admitting: Neurology

## 2021-08-05 VITALS — BP 100/70 | HR 83 | Ht 62.0 in | Wt 161.0 lb

## 2021-08-05 DIAGNOSIS — G43009 Migraine without aura, not intractable, without status migrainosus: Secondary | ICD-10-CM | POA: Diagnosis not present

## 2021-08-05 DIAGNOSIS — G40209 Localization-related (focal) (partial) symptomatic epilepsy and epileptic syndromes with complex partial seizures, not intractable, without status epilepticus: Secondary | ICD-10-CM

## 2021-08-05 MED ORDER — GABAPENTIN 100 MG PO CAPS: ORAL_CAPSULE | ORAL | 3 refills | Status: DC

## 2021-08-05 MED ORDER — SUMATRIPTAN SUCCINATE 100 MG PO TABS: ORAL_TABLET | ORAL | 11 refills | Status: DC

## 2021-08-05 MED ORDER — TOPIRAMATE 50 MG PO TABS: ORAL_TABLET | ORAL | 11 refills | Status: DC

## 2021-08-05 NOTE — Patient Instructions (Signed)
Good to see you. Continue all your medications. Refills sent for Gabapentin '100mg'$ : take 1 capsule every night, Topamax '25mg'$ : take 3 tablets in AM, 4 tablets in PM, and Sumatriptan as needed.   Follow-up in 6 months, call for any changes   Seizure Precautions: 1. If medication has been prescribed for you to prevent seizures, take it exactly as directed.  Do not stop taking the medicine without talking to your doctor first, even if you have not had a seizure in a long time.   2. Avoid activities in which a seizure would cause danger to yourself or to others.  Don't operate dangerous machinery, swim alone, or climb in high or dangerous places, such as on ladders, roofs, or girders.  Do not drive unless your doctor says you may.  3. If you have any warning that you may have a seizure, lay down in a safe place where you can't hurt yourself.    4.  No driving for 6 months from last seizure, as per [facility].   Please refer to the following link on the [place] website for more information: http://[email or web address]/[email or web address]/driving/drivingu.cfm   5.  Maintain good sleep hygiene. Avoid alcohol  6.  Contact your doctor if you have any problems that may be related to the medicine you are taking.  7.  Call 911 and bring the patient back to the ED if:        A.  The seizure lasts longer than 5 minutes.       B.  The patient doesn't awaken shortly after the seizure  C.  The patient has new problems such as difficulty seeing, speaking or moving  D.  The patient was injured during the seizure  E.  The patient has a temperature over 102 F (39C)  F.  The patient vomited and now is having trouble breathing

## 2021-08-05 NOTE — Progress Notes (Unsigned)
NEUROLOGY FOLLOW UP OFFICE NOTE  Victoria Bishop [id number] [birth year]  HISTORY OF PRESENT ILLNESS: I had the pleasure of seeing Victoria Bishop in follow-up in the neurology clinic on 08/05/2021.  The patient was last seen 6 months ago for recurrent seizures and migraines. She is again alone in the office today. Since her last visit, she continues to deny any seizures. She was seen by Cardiology last month for dizziness, chest pain, and left arm weakness. She reported feeling as though she had "brain fog" one time, comparing the sensation to feeling as though her head could not breathe. She reported these were different from her migraines and seizures. She had a brain MRI without contrast on 06/24/2021 with no acute changes. 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 She states today that she has not had any further dizziness or left arm weakness. She attributes her symptoms to high cholesterol, stating she has cut down sweet tea to once a week. She mostly has headaches when stressed, averaging around 2 a month. Sumatriptan helps for rescue. She continues on Topiramate '150mg'$  in AM, '200mg'$  in PM and Gabapentin '100mg'$  qhs without side effects. Sleep is good. She is traveling to [place] to visit her mother for 2 weeks.     History on Initial Assessment 05/11/2017: This is a 56 yo RH woman presenting for migraines, meningioma resection, and seizures. She recently moved from [place], records from her Neurologist in [place], [place] were reviewed. She started having seizures in July 2000 while still living in [place]. At that time she had eclampsia during delivery, she had pain radiating to her head then lost awareness. She was admitted to the hospital for a month and had renal failure requiring dialysis. Per report, 3 days later, she had seizures. There is an MRI brain report from July 2000 was concerning for PRES, reporting abnormal foci of signal  intensity involving the cortical and subcortical regions of the posterior parietal and occipital lobes in a fairly symmetrical fashion. The largest area of abnormal signal involves the left posterior parietal lobe. Initially she was having episodes of loss of consciousness with convulsions. She was initially started on Tegretol and Dilantin immediately post-partum, and had been very well controlled on carbamazepine XR for several years. There is an EEG report from 11/2005 quoted from [facility] reporting "epileptiform activity generalized, although there was not significant associated clinical motor activity." She had an EEG in [place] in 04/2006 reporting occasional sharp and slow wave complexes that appear epileptiform over the left anterior temporal regions. They deny any convulsions since 2000. Since then, she has been having recurrent stereotyped episodes starting with left-sided numbness and tingling on the arm, leg, face, and left side of her tongue. The left side of her head "feels like it is going weak." She describes electric shocks. There is no jerking or twitching but she describes them as spasms, then she feels weaker on the left side after. She cannot concentrate when this occurs, she speaks but slows down a lot, keeps saying "uhm" for several minutes. Apparently she can still function and do things when this occurs. She is sleepy afterwards and wakes up feeling better, but her daughter notices it takes a couple of days to return to her normal baseline. There is no associated headaches. She has difficulty saying how often they are, sometimes occurring twice a week, other times every other week. Last episode was 1.5 weeks ago. When she has them at night,  she has really bad dreams. Her husband thinks she is having nightmares but when she wakes up she feels different. She denies any olfactory/gustatory hallucinations, rising epigastric sensation. She has noticed some gaps in time, her daughter has to repeat  things some times.    Records from [place] were reviewed. She was transitioned off carbamazepine to zonisamide in 2010 due to concerns for osteoporosis, however had abdominal pain and stomach burning, and was switched to Levetiracetam. 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 She reports that headaches worsened after the surgery and was started on Topiramate in 2015. She feels this has helped with the headaches but not the seizures. She is taking Topiramate '50mg'$  BID. Higher doses in the past caused side effects. She reports stopping the Keppra last February 2018 because her mood swings were horrible and anxiety was heightened. This has increased seizures frequency, but the anxiety and mood swings are better. Her Zoloft dose was also increased. She takes sumatriptan around twice a week on average for the migraines. Sleep is good. She denies any dizziness, diplopia, dysarthria/dysphagia, neck/back pain, bowel/bladder dysfunction. They have noticed she has a cough that would not stop if she has mint or a drink. She   Diagnostic Data: 03/20/2016 EEG normal wake and sleep EEG MRI brain 09/02/2015: Stable to slightly decreased size of rounded, well-circumscribed, homogenously  enhancing, extradural right cerebellar hemisphere lesion, again most consistent with a meningioma; this measures approximately 1.2 x 1.2 x 1.3 cm (AP x ML x CC) today. No new lesions suspicious for meningioma are seen. 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 She had meningioma resection in  2013. Otherwise she had a normal birth and early development.  There is no history of febrile convulsions, CNS infections such as meningitis/encephalitis, significant traumatic brain injury, or family history of seizures.   Prior AEDs: Tegretol, Dilantin, Keppra, Zonisamide  PAST MEDICAL HISTORY: Past Medical History:  Diagnosis Date   Anxiety    Brain tumor ([facility]) 2013   Depression    Epilepsy ([facility])    GERD (gastroesophageal reflux disease)    Meningitis 2013   Migraines    Seizures ([facility])     MEDICATIONS: Current Outpatient Medications on File Prior to Visit  Medication Sig Dispense Refill   atorvastatin (LIPITOR) 20 MG tablet Take 1 tablet (20 mg total) by mouth daily. 90 tablet 3   cyclobenzaprine (FLEXERIL) 10 MG tablet Take 1 tablet (10 mg total) by mouth 3 (three) times daily as needed for muscle spasms. 30 tablet 1   hydrOXYzine (ATARAX) 25 MG tablet Take 1 tablet (25 mg total) by mouth 3 (three) times daily as needed for anxiety. 60 tablet 5   sertraline (ZOLOFT) 50 MG tablet TAKE 3 TABLETS BY MOUTH AT BEDTIME 270 tablet 0   SUMAtriptan (IMITREX) 100 MG tablet TAKE 1 TABLET BY MOUTH AT FIRST SIGN OF MIGRAINE-MAY REPEAT IN 2 HRS-MAX 2 TABS/24 HRS 10 tablet 11   topiramate (TOPAMAX) 50 MG tablet Take 3 tablets in AM, 4 tablets at bedtime 210 tablet 11   gabapentin (NEURONTIN) 100 MG capsule Take  1 capsule every night (Patient not taking: Reported on 06/24/2021) 30 capsule 11   No current facility-administered medications on file prior to visit.    ALLERGIES: No Known Allergies  FAMILY HISTORY: Family History  Problem Relation Age of Onset   Hypertension Mother    Hypertension Father    Hyperlipidemia Father    Anxiety disorder Daughter    Hearing loss Son    Anxiety disorder Son    Other Daughter        vader syndrome   Diabetes Neg Hx    Cancer Neg Hx     SOCIAL HISTORY: Social History   Socioeconomic History   Marital status: Married    Spouse name: Not on  file   Number of children: Not on file   Years of education: Not on file   Highest education level: Not on file  Occupational History   Occupation: food server    Employer: [employer]  Tobacco Use   Smoking status: Never   Smokeless tobacco: Never  Vaping Use   Vaping Use: Never used  Substance and Sexual Activity   Alcohol use: No   Drug use: Yes    Types: Barbituates   Sexual activity: Yes    Birth control/protection: None  Other Topics Concern   Not on file  Social History Narrative   Originally from [place], non smoker and non drinker, lives with husband and a son; 3 children (2 daughters and 1 son), 1 daughter passed at age 14       Lives in 1 story home   9th grade education   Works with [employer]   Right handed    5 steps one door and 3 steps on other   Social Determinants of Radio broadcast assistant Strain: Not on file  Food Insecurity: Not on file  Transportation Needs: Not on file  Physical Activity: Not on file  Stress: Not on file  Social Connections: Not on file  Intimate Partner Violence: Not on file     PHYSICAL EXAM: Vitals:   08/05/21 1419  BP: 100/70  Pulse: 83  SpO2: 97%   General: No acute distress Head:  Normocephalic/atraumatic Skin/Extremities: No rash, no edema Neurological Exam: alert and awake. No aphasia or dysarthria. Fund of knowledge is appropriate.  Attention and concentration are normal.   Cranial nerves: Pupils equal, round. Extraocular movements intact with no nystagmus. Visual fields full.  No facial asymmetry.  Motor: Bulk and tone normal, muscle strength 5/5 throughout with no pronator drift.   Finger to nose testing intact.  Gait narrow-based and steady, able to tandem walk adequately.  Romberg negative.   IMPRESSION: This is a 56 yo RH woman with a history of migraines, meningioma resection, and seizures suggestive of focal seizures without impairment of awareness, possibly arising from the right  hemisphere, describing left-sided numbness and post-ictal weakness on the left side. However,a prior EEG reported left anterior temporal epileptiform discharges. Recent brain MRI done 06/2021 no acute changes with volume loss in the left posterior parietal region after developing PRES in 2000, and unchanged meningioma in the right posterior fossa. She had dizziness and reported left arm weakness last month which she states has resolved and were different from her typical seizures or migraines. She continues to deny any seizures on Topiramate '150mg'$  in AM, '200mg'$  in PM and Gabapentin '100mg'$  qhs. She has prn sumatriptan for migraine rescue. She is aware of  driving laws to stop driving after a  seizure until 6 months seizure-free. Follow-up in 6 months, call for any changes.    Thank you for allowing me to participate in her care.  Please do not hesitate to call for any questions or concerns.   Ellouise Newer, M.D.   CC: Dr. Jonni Sanger

## 2021-08-09 ENCOUNTER — Ambulatory Visit (HOSPITAL_COMMUNITY): Admission: RE | Admit: 2021-08-09 | Payer: No Typology Code available for payment source | Source: Ambulatory Visit

## 2021-08-11 ENCOUNTER — Encounter (HOSPITAL_COMMUNITY): Payer: Self-pay | Admitting: Physician Assistant

## 2021-08-17 ENCOUNTER — Ambulatory Visit (INDEPENDENT_AMBULATORY_CARE_PROVIDER_SITE_OTHER): Payer: No Typology Code available for payment source | Admitting: Internal Medicine

## 2021-08-17 ENCOUNTER — Encounter: Payer: Self-pay | Admitting: Internal Medicine

## 2021-08-17 ENCOUNTER — Ambulatory Visit: Payer: No Typology Code available for payment source | Admitting: Neurology

## 2021-08-17 VITALS — BP 110/70 | HR 73 | Temp 98.6°F | Ht 62.0 in | Wt 160.6 lb

## 2021-08-17 DIAGNOSIS — G43809 Other migraine, not intractable, without status migrainosus: Secondary | ICD-10-CM

## 2021-08-17 DIAGNOSIS — Z1231 Encounter for screening mammogram for malignant neoplasm of breast: Secondary | ICD-10-CM

## 2021-08-17 DIAGNOSIS — D329 Benign neoplasm of meninges, unspecified: Secondary | ICD-10-CM

## 2021-08-17 DIAGNOSIS — G40109 Localization-related (focal) (partial) symptomatic epilepsy and epileptic syndromes with simple partial seizures, not intractable, without status epilepticus: Secondary | ICD-10-CM

## 2021-08-17 DIAGNOSIS — Z23 Encounter for immunization: Secondary | ICD-10-CM

## 2021-08-17 DIAGNOSIS — E782 Mixed hyperlipidemia: Secondary | ICD-10-CM | POA: Diagnosis not present

## 2021-08-17 DIAGNOSIS — Z124 Encounter for screening for malignant neoplasm of cervix: Secondary | ICD-10-CM

## 2021-08-17 NOTE — Patient Instructions (Signed)
-  Nice seeing you today!!  -Schedule follow up in 3 months for your physical. Come in fasting that day.  -Tdap today.

## 2021-08-17 NOTE — Progress Notes (Signed)
Established Patient Office Visit     CC/Reason for Visit: Establish care, follow-up chronic medical conditions  HPI: Victoria Bishop is a 56 y.o. female who is coming in today for the above mentioned reasons. Past Medical History is significant for: Newly diagnosed hyperlipidemia started on a statin about 6 weeks ago.  History of meningioma status postresection, seizures MI reins followed by neurology as well as depression.  She is establishing care with me today.  She has no acute concerns.  She is overdue for Tdap.  She is also overdue for mammogram and Pap smear.  She had a colonoscopy in 2017.  She does not smoke, she does not drink, she has no known drug allergies.  No family history of significance.   Past Medical/Surgical History: Past Medical History:  Diagnosis Date   Anxiety    Brain tumor ([facility]) 2013   Depression    Epilepsy ([facility])    GERD (gastroesophageal reflux disease)    Meningitis 2013   Migraines    Seizures ([facility])     Past Surgical History:  Procedure Laterality Date   BRAIN SURGERY  2014   CESAREAN SECTION     CT RADIATION THERAPY GUIDE  2014    Social History:  reports that she has never smoked. She has never used smokeless tobacco. She reports current drug use. Drug: Barbituates. She reports that she does not drink alcohol.  Allergies: No Known Allergies  Family History:  Family History  Problem Relation Age of Onset   Hypertension Mother    Hypertension Father    Hyperlipidemia Father    Anxiety disorder Daughter    Hearing loss Son    Anxiety disorder Son    Other Daughter        vader syndrome   Diabetes Neg Hx    Cancer Neg Hx      Current Outpatient Medications:    atorvastatin (LIPITOR) 20 MG tablet, Take 1 tablet (20 mg total) by mouth daily., Disp: 90 tablet, Rfl: 3   cyclobenzaprine (FLEXERIL) 10 MG tablet, Take 1 tablet (10 mg total) by mouth 3 (three) times daily as needed for muscle spasms., Disp: 30 tablet, Rfl: 1    gabapentin (NEURONTIN) 100 MG capsule, Take 1 capsule every night, Disp: 90 capsule, Rfl: 3   hydrOXYzine (ATARAX) 25 MG tablet, Take 1 tablet (25 mg total) by mouth 3 (three) times daily as needed for anxiety., Disp: 60 tablet, Rfl: 5   sertraline (ZOLOFT) 50 MG tablet, TAKE 3 TABLETS BY MOUTH AT BEDTIME, Disp: 270 tablet, Rfl: 0   SUMAtriptan (IMITREX) 100 MG tablet, TAKE 1 TABLET BY MOUTH AT FIRST SIGN OF MIGRAINE-MAY REPEAT IN 2 HRS-MAX 2 TABS/24 HRS, Disp: 10 tablet, Rfl: 11   topiramate (TOPAMAX) 50 MG tablet, Take 3 tablets in AM, 4 tablets at bedtime, Disp: 210 tablet, Rfl: 11  Review of Systems:  Constitutional: Denies fever, chills, diaphoresis, appetite change and fatigue.  HEENT: Denies photophobia, eye pain, redness, hearing loss, ear pain, congestion, sore throat, rhinorrhea, sneezing, mouth sores, trouble swallowing, neck pain, neck stiffness and tinnitus.   Respiratory: Denies SOB, DOE, cough, chest tightness,  and wheezing.   Cardiovascular: Denies chest pain, palpitations and leg swelling.  Gastrointestinal: Denies nausea, vomiting, abdominal pain, diarrhea, constipation, blood in stool and abdominal distention.  Genitourinary: Denies dysuria, urgency, frequency, hematuria, flank pain and difficulty urinating.  Endocrine: Denies: hot or cold intolerance, sweats, changes in hair or nails, polyuria, polydipsia. Musculoskeletal: Denies myalgias, back  pain, joint swelling, arthralgias and gait problem.  Skin: Denies pallor, rash and wound.  Neurological: Denies dizziness, seizures, syncope, weakness, light-headedness, numbness and headaches.  Hematological: Denies adenopathy. Easy bruising, personal or family bleeding history  Psychiatric/Behavioral: Denies suicidal ideation, mood changes, confusion, nervousness, sleep disturbance and agitation    Physical Exam: Vitals:   08/17/21 1256  BP: 110/70  Pulse: 73  Temp: 98.6 F (37 C)  TempSrc: Oral  SpO2: 98%  Weight: 160 lb  9.6 oz (72.8 kg)  Height: '5\' 2"'$  (1.575 m)    Body mass index is 29.37 kg/m.   Constitutional: NAD, calm, comfortable Eyes: PERRL, lids and conjunctivae normal ENMT: Mucous membranes are moist.  Respiratory: clear to auscultation bilaterally, no wheezing, no crackles. Normal respiratory effort. No accessory muscle use.  Cardiovascular: Regular rate and rhythm, no murmurs / rubs / gallops. No extremity edema.  Psychiatric: Normal judgment and insight. Alert and oriented x 3. Normal mood.    Impression and Plan:  Mixed hyperlipidemia -She will return in 3 months for fasting lipids.  She is on 20 mg of atorvastatin, with an LDL of 203, suspect she will need a high intensity statin.  Meningioma ([facility])  -Status postresection  Other migraine without status migrainosus, not intractable -Stable, followed by neurology, prescribed Return to care and topiramate.  Need for Tdap vaccination -Tdap administered today  Encounter for screening mammogram for malignant neoplasm of breast -Mammogram referral placed  Cervical cancer screening -GYN referral placed    Time spent:32 minutes reviewing chart, interviewing and examining patient and formulating plan of care.   Patient Instructions  -Nice seeing you today!!  -Schedule follow up in 3 months for your physical. Come in fasting that day.  -Tdap today.      Lelon Frohlich, MD [facility]

## 2021-08-17 NOTE — Addendum Note (Signed)
Addended by: Westley Hummer B on: 08/17/2021 02:14 PM   Modules accepted: Orders

## 2021-08-19 ENCOUNTER — Ambulatory Visit: Payer: No Typology Code available for payment source | Admitting: Neurology

## 2021-09-02 ENCOUNTER — Ambulatory Visit
Admission: RE | Admit: 2021-09-02 | Discharge: 2021-09-02 | Disposition: A | Discharge status: Home / Self Care | Payer: No Typology Code available for payment source | Source: Ambulatory Visit | Attending: Internal Medicine | Admitting: Internal Medicine

## 2021-09-02 DIAGNOSIS — Z1231 Encounter for screening mammogram for malignant neoplasm of breast: Secondary | ICD-10-CM

## 2021-10-14 ENCOUNTER — Other Ambulatory Visit: Payer: Self-pay | Admitting: Family Medicine

## 2021-10-23 ENCOUNTER — Encounter: Payer: Self-pay | Admitting: Internal Medicine

## 2021-10-23 DIAGNOSIS — E782 Mixed hyperlipidemia: Secondary | ICD-10-CM

## 2021-10-28 ENCOUNTER — Other Ambulatory Visit (INDEPENDENT_AMBULATORY_CARE_PROVIDER_SITE_OTHER): Payer: No Typology Code available for payment source

## 2021-10-28 DIAGNOSIS — E782 Mixed hyperlipidemia: Secondary | ICD-10-CM

## 2021-10-28 LAB — LIPID PANEL
Cholesterol: 267 mg/dL — ABNORMAL HIGH (ref 0–200)
HDL: 46.5 mg/dL (ref >39.00)
LDL Cholesterol: 190 mg/dL — ABNORMAL HIGH (ref 0–99)
NonHDL: 220.73
Total CHOL/HDL Ratio: 6
Triglycerides: 153 mg/dL — ABNORMAL HIGH (ref 0.0–149.0)
VLDL: 30.6 mg/dL (ref 0.0–40.0)

## 2021-10-29 LAB — COMPLETE METABOLIC PANEL WITH GFR
AG Ratio: 1.6 (calc) (ref 1.0–2.5)
ALT: 16 U/L (ref 6–29)
AST: 17 U/L (ref 10–35)
Albumin: 4.3 g/dL (ref 3.6–5.1)
Alkaline phosphatase (APISO): 72 U/L (ref 37–153)
BUN: 14 mg/dL (ref 7–25)
CO2: 22 mmol/L (ref 20–32)
Calcium: 9.2 mg/dL (ref 8.6–10.4)
Chloride: 110 mmol/L (ref 98–110)
Creat: 0.76 mg/dL (ref 0.50–1.03)
Globulin: 2.7 g/dL (calc) (ref 1.9–3.7)
Glucose, Bld: 89 mg/dL (ref 65–99)
Potassium: 3.8 mmol/L (ref 3.5–5.3)
Sodium: 142 mmol/L (ref 135–146)
Total Bilirubin: 0.5 mg/dL (ref 0.2–1.2)
Total Protein: 7 g/dL (ref 6.1–8.1)
eGFR: 91 mL/min/{1.73_m2} (ref >=60)

## 2021-11-06 ENCOUNTER — Encounter: Payer: Self-pay | Admitting: Internal Medicine

## 2021-11-18 ENCOUNTER — Other Ambulatory Visit (HOSPITAL_COMMUNITY): Payer: Self-pay

## 2021-11-29 ENCOUNTER — Encounter: Payer: Self-pay | Admitting: Neurology

## 2021-12-02 ENCOUNTER — Encounter: Payer: Self-pay | Admitting: Internal Medicine

## 2021-12-02 ENCOUNTER — Telehealth: Payer: Self-pay | Admitting: Neurology

## 2021-12-02 NOTE — Telephone Encounter (Signed)
Pt called back no answer no voice mail set up

## 2021-12-02 NOTE — Telephone Encounter (Signed)
Pt is calling about the mychart message she sent 9/19 asking for a letter for her job. Pt advised that Dr [name] is out of the office this week and it will be next week before we can get her the letter, pt verbalized understanding,

## 2021-12-02 NOTE — Telephone Encounter (Signed)
No called back no answer no voice mail set up

## 2021-12-02 NOTE — Telephone Encounter (Signed)
Patient called and left a VM requesting a call back

## 2021-12-02 NOTE — Telephone Encounter (Signed)
Pt left a message after hours requesting a call back from a nurse.

## 2021-12-05 ENCOUNTER — Encounter: Payer: Self-pay | Admitting: Neurology

## 2021-12-05 NOTE — Telephone Encounter (Signed)
Letter done, thanks.

## 2021-12-05 NOTE — Telephone Encounter (Signed)
Pt called no answer no voice mail set up when she calls back we need to let her know letter is ready and up front for her

## 2021-12-07 NOTE — Telephone Encounter (Signed)
Pt called no answer no voice mail, my chart message sent to pt that letter is ready and at front desk

## 2022-01-09 ENCOUNTER — Other Ambulatory Visit: Payer: Self-pay | Admitting: Family Medicine

## 2022-02-24 ENCOUNTER — Ambulatory Visit (INDEPENDENT_AMBULATORY_CARE_PROVIDER_SITE_OTHER): Payer: No Typology Code available for payment source | Admitting: Neurology

## 2022-02-24 ENCOUNTER — Encounter: Payer: Self-pay | Admitting: Neurology

## 2022-02-24 VITALS — BP 101/68 | HR 85 | Ht 62.0 in | Wt 164.0 lb

## 2022-02-24 DIAGNOSIS — G43009 Migraine without aura, not intractable, without status migrainosus: Secondary | ICD-10-CM

## 2022-02-24 DIAGNOSIS — G40209 Localization-related (focal) (partial) symptomatic epilepsy and epileptic syndromes with complex partial seizures, not intractable, without status epilepticus: Secondary | ICD-10-CM

## 2022-02-24 MED ORDER — GABAPENTIN 100 MG PO CAPS: ORAL_CAPSULE | ORAL | 3 refills | Status: DC

## 2022-02-24 MED ORDER — TOPIRAMATE 50 MG PO TABS: ORAL_TABLET | ORAL | 11 refills | Status: DC

## 2022-02-24 MED ORDER — SUMATRIPTAN SUCCINATE 100 MG PO TABS: ORAL_TABLET | ORAL | 11 refills | Status: DC

## 2022-02-24 NOTE — Progress Notes (Signed)
NEUROLOGY FOLLOW UP OFFICE NOTE  Victoria Bishop [id number] [birth year]  HISTORY OF PRESENT ILLNESS: I had the pleasure of seeing Victoria Bishop in follow-up in the neurology clinic on 02/24/2022.  The patient was last seen 7 months for recurrent seizures and migraines. She is alone in the office today.  Records and images were personally reviewed where available.  Since her last visit, she continues to deny any seizures. Sometimes she has episodes in her sleep where she is dreaming and wakes up with her left side feeling different, no tongue bite or incontinence. She denies any staring/unresponsive episodes, gaps in time, olfactory/gustatory hallucinations, focal numbness/tingling/weakness, myoclonic jerks. She has a lot of migraines that she attributes to work stress. She had a migraine yesterday, migraines have good response to prn sumatriptan. She denies any dizziness, vision changes, no falls. She is on Topiramate '150mg'$  in AM, '200mg'$  in PM and Gabapentin '100mg'$  qhs without side effects.    History on Initial Assessment 05/11/2017: This is a 56 yo RH woman presenting for migraines, meningioma resection, and seizures. She recently moved from [place], records from her Neurologist in [place], [place] were reviewed. She started having seizures in July 2000 while still living in [place]. At that time she had eclampsia during delivery, she had pain radiating to her head then lost awareness. She was admitted to the hospital for a month and had renal failure requiring dialysis. Per report, 3 days later, she had seizures. There is an MRI brain report from July 2000 was concerning for PRES, reporting abnormal foci of signal intensity involving the cortical and subcortical regions of the posterior parietal and occipital lobes in a fairly symmetrical fashion. The largest area of abnormal signal involves the left posterior parietal lobe. Initially she was having episodes of loss of consciousness with convulsions. She  was initially started on Tegretol and Dilantin immediately post-partum, and had been very well controlled on carbamazepine XR for several years. There is an EEG report from 11/2005 quoted from [facility] reporting "epileptiform activity generalized, although there was not significant associated clinical motor activity." She had an EEG in [place] in 04/2006 reporting occasional sharp and slow wave complexes that appear epileptiform over the left anterior temporal regions. They deny any convulsions since 2000. Since then, she has been having recurrent stereotyped episodes starting with left-sided numbness and tingling on the arm, leg, face, and left side of her tongue. The left side of her head "feels like it is going weak." She describes electric shocks. There is no jerking or twitching but she describes them as spasms, then she feels weaker on the left side after. She cannot concentrate when this occurs, she speaks but slows down a lot, keeps saying "uhm" for several minutes. Apparently she can still function and do things when this occurs. She is sleepy afterwards and wakes up feeling better, but her daughter notices it takes a couple of days to return to her normal baseline. There is no associated headaches. She has difficulty saying how often they are, sometimes occurring twice a week, other times every other week. Last episode was 1.5 weeks ago. When she has them at night, she has really bad dreams. Her husband thinks she is having nightmares but when she wakes up she feels different. She denies any olfactory/gustatory hallucinations, rising epigastric sensation. She has noticed some gaps in time, her daughter has to repeat things some times.    Records from [place] were reviewed. She was transitioned off carbamazepine to zonisamide in 2010 due  to concerns for osteoporosis, however had abdominal pain and stomach burning, and was switched to Levetiracetam. 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 She reports that headaches worsened after the surgery and was started on Topiramate in 2015. She feels this has helped with the headaches but not the seizures. She is taking Topiramate '50mg'$  BID. Higher doses in the past caused side effects. She reports stopping the Keppra last February 2018 because her mood swings were horrible and anxiety was heightened. This has increased seizures frequency, but the anxiety and mood swings are better. Her Zoloft dose was also increased. She takes sumatriptan around twice a week on average for the migraines. Sleep is good. She denies any dizziness, diplopia, dysarthria/dysphagia, neck/back pain, bowel/bladder dysfunction. They have noticed she has a cough that would not stop if she has mint or a drink. She   Diagnostic Data: 03/20/2016 EEG normal wake and sleep EEG MRI brain 09/02/2015: Stable to slightly decreased size of rounded, well-circumscribed, homogenously  enhancing, extradural right cerebellar hemisphere lesion, again most consistent with a meningioma; this measures approximately 1.2 x 1.2 x 1.3 cm (AP x ML x CC) today. No new lesions suspicious for meningioma are seen. 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 She had meningioma resection in 2013. Otherwise she had a normal birth and early development.  There is no history of febrile convulsions, CNS infections such as meningitis/encephalitis, significant traumatic brain injury, or family history of seizures.   Prior AEDs: Tegretol, Dilantin, Keppra, Zonisamide  PAST MEDICAL  HISTORY: Past Medical History:  Diagnosis Date   Anxiety    Brain tumor ([facility]) 2013   Depression    Epilepsy ([facility])    GERD (gastroesophageal reflux disease)    Meningitis 2013   Migraines    Seizures ([facility])     MEDICATIONS: Current Outpatient Medications on File Prior to Visit  Medication Sig Dispense Refill   atorvastatin (LIPITOR) 20 MG tablet Take 1 tablet (20 mg total) by mouth daily. 90 tablet 3   cyclobenzaprine (FLEXERIL) 10 MG tablet Take 1 tablet (10 mg total) by mouth 3 (three) times daily as needed for muscle spasms. 30 tablet 1   gabapentin (NEURONTIN) 100 MG capsule Take 1 capsule every night 90 capsule 3   hydrOXYzine (ATARAX) 25 MG tablet Take 1 tablet (25 mg total) by mouth 3 (three) times daily as needed for anxiety. 60 tablet 5   sertraline (ZOLOFT) 50 MG tablet TAKE 3 TABLETS BY MOUTH AT BEDTIME 270 tablet 0   SUMAtriptan (IMITREX) 100 MG tablet TAKE 1 TABLET BY MOUTH AT FIRST SIGN OF MIGRAINE-MAY REPEAT IN 2 HRS-MAX 2 TABS/24 HRS 10 tablet 11   topiramate (TOPAMAX) 50 MG tablet Take 3 tablets in AM, 4 tablets at bedtime 210 tablet 11   No current facility-administered medications on file prior to visit.    ALLERGIES: No Known Allergies  FAMILY HISTORY: Family History  Problem Relation Age of Onset   Hypertension Mother    Hypertension Father    Hyperlipidemia Father    Anxiety disorder Daughter    Other Daughter  vader syndrome   Hearing loss Son    Anxiety disorder Son    Diabetes Neg Hx    Cancer Neg Hx    Breast cancer Neg Hx     SOCIAL HISTORY: Social History   Socioeconomic History   Marital status: Married    Spouse name: Not on file   Number of children: Not on file   Years of education: Not on file   Highest education level: Not on file  Occupational History   Occupation: food server    Employer: [employer]  Tobacco Use   Smoking status: Never   Smokeless tobacco: Never  Vaping Use   Vaping Use: Never used   Substance and Sexual Activity   Alcohol use: No   Drug use: Yes    Types: Barbituates   Sexual activity: Yes    Birth control/protection: None  Other Topics Concern   Not on file  Social History Narrative   Originally from [place], non smoker and non drinker, lives with husband and a son; 3 children (2 daughters and 1 son), 1 daughter passed at age 72       Lives in 1 story home   9th grade education   Works with [employer]   Right handed    5 steps one door and 3 steps on other   Social Determinants of Radio broadcast assistant Strain: Not on file  Food Insecurity: Not on file  Transportation Needs: Not on file  Physical Activity: Not on file  Stress: Not on file  Social Connections: Not on file  Intimate Partner Violence: Not on file     PHYSICAL EXAM: Vitals:   02/24/22 1421  BP: 101/68  Pulse: 85  SpO2: 97%   General: No acute distress Head:  Normocephalic/atraumatic Skin/Extremities: No rash, no edema Neurological Exam: alert and awake. No aphasia or dysarthria. Fund of knowledge is appropriate.  Attention and concentration are normal.   Cranial nerves: Pupils equal, round. Extraocular movements intact with no nystagmus. Visual fields full.  No facial asymmetry.  Motor: Bulk and tone normal, muscle strength 5/5 throughout with no pronator drift.   Finger to nose testing intact.  Gait narrow-based and steady, able to tandem walk adequately.  Romberg negative.   IMPRESSION: This is a 56 yo RH woman with a history of migraines, meningioma resection, and seizures suggestive of focal seizures without impairment of awareness, possibly arising from the right hemisphere, describing left-sided numbness and post-ictal weakness on the left side. However,a prior EEG reported left anterior temporal epileptiform discharges. Last brain MRI done 06/2021 no acute changes with volume loss in the left posterior parietal region after developing PRES in 2000, and unchanged  meningioma in the right posterior fossa. She denies any seizures with loss of consciousness. She continues to report frequent migraines but does not want to increase medications. Continue Topiramate '150mg'$  in AM, '200mg'$  in PM and Gabapentin '100mg'$  qhs, refills also sent for prn sumatriptan. She is aware of [place] driving laws to stop driving after a seizure until 6 months seizure-free. Follow-up in 8 months, call for any changes.    Thank you for allowing me to participate in her care.  Please do not hesitate to call for any questions or concerns.  Ellouise Newer, M.D.   CC: Dr. Isaac Bliss

## 2022-02-24 NOTE — Patient Instructions (Signed)
Good to see you! Continue all your medications. Follow-up in 8 months, call for any changes.   Seizure Precautions: 1. If medication has been prescribed for you to prevent seizures, take it exactly as directed.  Do not stop taking the medicine without talking to your doctor first, even if you have not had a seizure in a long time.   2. Avoid activities in which a seizure would cause danger to yourself or to others.  Don't operate dangerous machinery, swim alone, or climb in high or dangerous places, such as on ladders, roofs, or girders.  Do not drive unless your doctor says you may.  3. If you have any warning that you may have a seizure, lay down in a safe place where you can't hurt yourself.    4.  No driving for 6 months from last seizure, as per [facility].   Please refer to the following link on the [facility] website for more information: http://[email or web address].org/[email or web address]/driving/drivingu.cfm   5.  Maintain good sleep hygiene.  6.  Contact your doctor if you have any problems that may be related to the medicine you are taking.  7.  Call 911 and bring the patient back to the ED if:        A.  The seizure lasts longer than 5 minutes.       B.  The patient doesn't awaken shortly after the seizure  C.  The patient has new problems such as difficulty seeing, speaking or moving  D.  The patient was injured during the seizure  E.  The patient has a temperature over 102 F (39C)  F.  The patient vomited and now is having trouble breathing

## 2022-03-01 ENCOUNTER — Ambulatory Visit (INDEPENDENT_AMBULATORY_CARE_PROVIDER_SITE_OTHER): Payer: No Typology Code available for payment source | Admitting: Internal Medicine

## 2022-03-01 VITALS — BP 120/70 | HR 80 | Temp 98.0°F | Wt 160.8 lb

## 2022-03-01 DIAGNOSIS — E782 Mixed hyperlipidemia: Secondary | ICD-10-CM

## 2022-03-01 DIAGNOSIS — R079 Chest pain, unspecified: Secondary | ICD-10-CM

## 2022-03-01 DIAGNOSIS — G40109 Localization-related (focal) (partial) symptomatic epilepsy and epileptic syndromes with simple partial seizures, not intractable, without status epilepticus: Secondary | ICD-10-CM | POA: Diagnosis not present

## 2022-03-01 DIAGNOSIS — Z23 Encounter for immunization: Secondary | ICD-10-CM

## 2022-03-01 DIAGNOSIS — D329 Benign neoplasm of meninges, unspecified: Secondary | ICD-10-CM | POA: Diagnosis not present

## 2022-03-01 LAB — COMPREHENSIVE METABOLIC PANEL
ALT: 23 U/L (ref 0–35)
AST: 27 U/L (ref 0–37)
Albumin: 4.5 g/dL (ref 3.5–5.2)
Alkaline Phosphatase: 77 U/L (ref 39–117)
BUN: 11 mg/dL (ref 6–23)
CO2: 27 mEq/L (ref 19–32)
Calcium: 9.8 mg/dL (ref 8.4–10.5)
Chloride: 104 mEq/L (ref 96–112)
Creatinine, Ser: 0.75 mg/dL (ref 0.40–1.20)
GFR: 87.93 mL/min (ref >60.00)
Glucose, Bld: 82 mg/dL (ref 70–99)
Potassium: 4.2 mEq/L (ref 3.5–5.1)
Sodium: 140 mEq/L (ref 135–145)
Total Bilirubin: 0.6 mg/dL (ref 0.2–1.2)
Total Protein: 7.6 g/dL (ref 6.0–8.3)

## 2022-03-01 LAB — LIPID PANEL
Cholesterol: 224 mg/dL — ABNORMAL HIGH (ref 0–200)
HDL: 61.6 mg/dL (ref >39.00)
LDL Cholesterol: 154 mg/dL — ABNORMAL HIGH (ref 0–99)
NonHDL: 162.57
Total CHOL/HDL Ratio: 4
Triglycerides: 45 mg/dL (ref 0.0–149.0)
VLDL: 9 mg/dL (ref 0.0–40.0)

## 2022-03-01 NOTE — Addendum Note (Signed)
Addended by: Westley Hummer B on: 03/01/2022 03:58 PM   Modules accepted: Orders

## 2022-03-01 NOTE — Progress Notes (Signed)
Established Patient Office Visit     CC/Reason for Visit: Chest pain  HPI: Victoria Bishop is a 56 y.o. female who is coming in today for the above mentioned reasons. Past Medical History is significant for: Hyperlipidemia, history of partial seizures, meningioma, migraine headaches, depression.  She is followed by neurology.  She states that yesterday while at work she was under a lot of stress and became upset.  She had the sudden onset of left chest pain that radiated into her shoulder, back and upper arm.  She felt transiently short of breath but this resolved quickly maybe after only 30 seconds or so.  She went home.  This happened again this morning at around 5 AM.  She became concerned and decided to schedule a visit with me today.  She does not think these are her seizures, these feel different.  She does not believe this is a migraine.  She does not feel fatigued, she has good exercise tolerance, no dyspnea on exertion.   Past Medical/Surgical History: Past Medical History:  Diagnosis Date   Anxiety    Brain tumor ([facility]) 2013   Depression    Epilepsy ([facility])    GERD (gastroesophageal reflux disease)    Meningitis 2013   Migraines    Seizures ([facility])     Past Surgical History:  Procedure Laterality Date   BRAIN SURGERY  2014   CESAREAN SECTION     CT RADIATION THERAPY GUIDE  2014    Social History:  reports that she has never smoked. She has never used smokeless tobacco. She reports current drug use. Drug: Barbituates. She reports that she does not drink alcohol.  Allergies: No Known Allergies  Family History:  Family History  Problem Relation Age of Onset   Hypertension Mother    Hypertension Father    Hyperlipidemia Father    Anxiety disorder Daughter    Other Daughter        vader syndrome   Hearing loss Son    Anxiety disorder Son    Diabetes Neg Hx    Cancer Neg Hx    Breast cancer Neg Hx      Current Outpatient Medications:    atorvastatin  (LIPITOR) 20 MG tablet, Take 1 tablet (20 mg total) by mouth daily., Disp: 90 tablet, Rfl: 3   cyclobenzaprine (FLEXERIL) 10 MG tablet, Take 1 tablet (10 mg total) by mouth 3 (three) times daily as needed for muscle spasms., Disp: 30 tablet, Rfl: 1   gabapentin (NEURONTIN) 100 MG capsule, Take 1 capsule every night, Disp: 90 capsule, Rfl: 3   hydrOXYzine (ATARAX) 25 MG tablet, Take 1 tablet (25 mg total) by mouth 3 (three) times daily as needed for anxiety., Disp: 60 tablet, Rfl: 5   sertraline (ZOLOFT) 50 MG tablet, TAKE 3 TABLETS BY MOUTH AT BEDTIME, Disp: 270 tablet, Rfl: 0   SUMAtriptan (IMITREX) 100 MG tablet, TAKE 1 TABLET BY MOUTH AT FIRST SIGN OF MIGRAINE-MAY REPEAT IN 2 HRS-MAX 2 TABS/24 HRS, Disp: 10 tablet, Rfl: 11   topiramate (TOPAMAX) 50 MG tablet, Take 3 tablets in AM, 4 tablets at bedtime, Disp: 210 tablet, Rfl: 11  Review of Systems:  Constitutional: Denies fever, chills, diaphoresis, appetite change and fatigue.  HEENT: Denies photophobia, eye pain, redness, hearing loss, ear pain, congestion, sore throat, rhinorrhea, sneezing, mouth sores, trouble swallowing, neck pain, neck stiffness and tinnitus.   Respiratory: Denies SOB, DOE, cough,   and wheezing.   Cardiovascular: Denies palpitations and  leg swelling.  Gastrointestinal: Denies nausea, vomiting, abdominal pain, diarrhea, constipation, blood in stool and abdominal distention.  Genitourinary: Denies dysuria, urgency, frequency, hematuria, flank pain and difficulty urinating.  Endocrine: Denies: hot or cold intolerance, sweats, changes in hair or nails, polyuria, polydipsia. Musculoskeletal: Denies myalgias, back pain, joint swelling, arthralgias and gait problem.  Skin: Denies pallor, rash and wound.  Neurological: Denies dizziness, seizures, syncope, weakness, light-headedness, numbness and headaches.  Hematological: Denies adenopathy. Easy bruising, personal or family bleeding history  Psychiatric/Behavioral: Denies  suicidal ideation, mood changes, confusion, nervousness, sleep disturbance and agitation    Physical Exam: Vitals:   03/01/22 1336  BP: 120/70  Pulse: 80  Temp: 98 F (36.7 C)  TempSrc: Oral  SpO2: 98%  Weight: 160 lb 12.8 oz (72.9 kg)    Body mass index is 29.41 kg/m.   Constitutional: NAD, calm, comfortable Eyes: PERRL, lids and conjunctivae normal ENMT: Mucous membranes are moist. Posterior pharynx clear of any exudate or lesions. Normal dentition. Tympanic membrane is pearly white, no erythema or bulging. Neck: normal, supple, no masses, no thyromegaly Respiratory: clear to auscultation bilaterally, no wheezing, no crackles. Normal respiratory effort. No accessory muscle use.  Cardiovascular: Regular rate and rhythm, no murmurs / rubs / gallops. No extremity edema. 2+ pedal pulses. No carotid bruits.  Abdomen: no tenderness, no masses palpated. No hepatosplenomegaly. Bowel sounds positive.  Musculoskeletal: no clubbing / cyanosis. No joint deformity upper and lower extremities. Good ROM, no contractures. Normal muscle tone.  Skin: no rashes, lesions, ulcers. No induration Neurologic: CN 2-12 grossly intact. Sensation intact, DTR normal. Strength 5/5 in all 4.  Psychiatric: Normal judgment and insight. Alert and oriented x 3. Normal mood.    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  She has very elevated LDL levels between 190 and 203 at the last couple checks.  She has refused to take atorvastatin and other statins. -EKG in office today shows normal sinus rhythm at a rate of 70, normal axis, possible old anterior infarct, nonspecific T changes. -I believe she would benefit from a heart workup, I will  initiate referral to cardiology. -I have again discussed importance of hyperlipidemia management with her. -Flu vaccine administered today.  Time spent:33 minutes reviewing chart, interviewing and examining patient and formulating plan of care.     Lelon Frohlich, MD [facility]

## 2022-03-02 ENCOUNTER — Encounter: Payer: Self-pay | Admitting: Cardiovascular Disease

## 2022-03-02 ENCOUNTER — Ambulatory Visit
Payer: No Typology Code available for payment source | Attending: Cardiovascular Disease | Admitting: Cardiovascular Disease

## 2022-03-02 VITALS — BP 110/74 | HR 95 | Ht 62.0 in | Wt 161.0 lb

## 2022-03-02 DIAGNOSIS — R072 Precordial pain: Secondary | ICD-10-CM

## 2022-03-02 DIAGNOSIS — R079 Chest pain, unspecified: Secondary | ICD-10-CM | POA: Diagnosis not present

## 2022-03-02 DIAGNOSIS — E782 Mixed hyperlipidemia: Secondary | ICD-10-CM | POA: Diagnosis not present

## 2022-03-02 MED ORDER — METOPROLOL TARTRATE 100 MG PO TABS: ORAL_TABLET | ORAL | 0 refills | Status: DC

## 2022-03-02 NOTE — Progress Notes (Signed)
CARDIOLOGY CONSULT NOTE       Patient ID: Victoria Bishop MRN: [id number] DOB/AGE: 1965/12/18 56 y.o.  Admit date: (Not on file) Referring Physician: Jerilee Hoh Primary Physician: Isaac Bliss, Rayford Halsted, MD Primary Cardiologist: New Reason for Consultation: Chest Pain    HPI:  56 y.o. referred by Dr Deniece Ree for chest pain. History of anxiety, sezures, brain tumor, migraines and GERD.  CRF;s only HLD.  At work Wednesday was stressed and upset. Sudden onset left chest pain radiating to shoulder back and upper arm Transient dyspnea Lasted < 30 minutes. Recurred 5 am next morning No exertional chest pain dyspnea palpitations or pre syncope May this year had palpitations with benign monitor 07/13/21 only PAC/PVC's < 1% total beats read by Dr Curt Bears At that time also had brain fog left sided sharp chest pain and left arm weakness more like she gets with her partial seizures  Works at [employer] 5 years Has a Psychologist, educational and this is causing stress She is from [place]. 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 Spent 10 years in [place] with sister In law then moved back to [place] in 2018  ROS All other systems reviewed and negative except as noted above  Past Medical History:  Diagnosis Date   Anxiety    Brain tumor ([place]) 2013   Depression    Epilepsy ([place])    GERD (gastroesophageal reflux disease)    Meningitis 2013   Migraines    Seizures ([place])     Family History  Problem Relation Age of Onset   Hypertension Mother    Hypertension Father    Hyperlipidemia Father    Anxiety disorder Daughter    Other Daughter        vader syndrome   Hearing loss Son    Anxiety disorder Son    Diabetes Neg Hx    Cancer Neg Hx    Breast cancer Neg Hx     Social History   Socioeconomic History   Marital status: Married    Spouse name: Not on file   Number of children: Not on file   Years of education: Not on file   Highest education level: Not on file   Occupational History   Occupation: food server    Employer: [employer]  Tobacco Use   Smoking status: Never   Smokeless tobacco: Never  Vaping Use   Vaping Use: Never used  Substance and Sexual Activity   Alcohol use: No   Drug use: Yes    Types: Barbituates   Sexual activity: Yes    Birth control/protection: None  Other Topics Concern   Not on file  Social History Narrative   Originally from [place], non smoker and non drinker, lives with husband and a son; 3 children (2 daughters and 1 son), 1 daughter passed at age 65       Lives in 1 story home   9th grade education   Works with [employer]   Right handed    5 steps one door and 3 steps on other   Social Determinants of Radio broadcast assistant Strain: Not on file  Food Insecurity: Not on file  Transportation Needs: Not on file  Physical Activity: Not on file  Stress: Not on file  Social Connections: Not on file  Intimate Partner Violence: Not on file    Past Surgical History:  Procedure Laterality Date   BRAIN SURGERY  2014   [place]  CT RADIATION THERAPY GUIDE  2014      Current Outpatient Medications:    atorvastatin (LIPITOR) 20 MG tablet, Take 1 tablet (20 mg total) by mouth daily., Disp: 90 tablet, Rfl: 3   cyclobenzaprine (FLEXERIL) 10 MG tablet, Take 1 tablet (10 mg total) by mouth 3 (three) times daily as needed for muscle spasms., Disp: 30 tablet, Rfl: 1   gabapentin (NEURONTIN) 100 MG capsule, Take 1 capsule every night, Disp: 90 capsule, Rfl: 3   hydrOXYzine (ATARAX) 25 MG tablet, Take 1 tablet (25 mg total) by mouth 3 (three) times daily as needed for anxiety., Disp: 60 tablet, Rfl: 5   sertraline (ZOLOFT) 50 MG tablet, TAKE 3 TABLETS BY MOUTH AT BEDTIME, Disp: 270 tablet, Rfl: 0   SUMAtriptan (IMITREX) 100 MG tablet, TAKE 1 TABLET BY MOUTH AT FIRST SIGN OF MIGRAINE-MAY REPEAT IN 2 HRS-MAX 2 TABS/24 HRS, Disp: 10 tablet, Rfl: 11   topiramate (TOPAMAX) 50 MG  tablet, Take 3 tablets in AM, 4 tablets at bedtime, Disp: 210 tablet, Rfl: 11    Physical Exam: Vitals:   03/02/22 1040  BP: 110/74  Pulse: 95  Height: '5\' 2"'$  (1.575 m)  Weight: 161 lb (73 kg)  SpO2: 95%  BMI (Calculated): 29.44       Affect appropriate Healthy:  appears stated age HEENT: normal Neck supple with no adenopathy JVP normal no bruits no thyromegaly Lungs clear with no wheezing and good diaphragmatic motion Heart:  S1/S2 no murmur, no rub, gallop or click PMI normal Abdomen: benighn, BS positve, no tenderness, no AAA no bruit.  No HSM or HJR Distal pulses intact with no bruits No edema Neuro non-focal Skin warm and dry No muscular weakness   Labs:   Lab Results  Component Value Date   WBC 4.4 06/24/2021   HGB 13.7 06/24/2021   HCT 41.1 06/24/2021   MCV 89.5 06/24/2021   PLT 269.0 06/24/2021    Recent Labs  Lab 03/01/22 1422  NA 140  K 4.2  CL 104  CO2 27  BUN 11  CREATININE 0.75  CALCIUM 9.8  PROT 7.6  BILITOT 0.6  ALKPHOS 77  ALT 23  AST 27  GLUCOSE 82   No results found for: "CKTOTAL", "CKMB", "CKMBINDEX", "TROPONINI"  Lab Results  Component Value Date   CHOL 224 (H) 03/01/2022   CHOL 267 (H) 10/28/2021   CHOL 276 (H) 06/24/2021   Lab Results  Component Value Date   HDL 61.60 03/01/2022   HDL 46.50 10/28/2021   HDL 50.60 06/24/2021   Lab Results  Component Value Date   LDLCALC 154 (H) 03/01/2022   LDLCALC 190 (H) 10/28/2021   LDLCALC 203 (H) 06/24/2021   Lab Results  Component Value Date   TRIG 45.0 03/01/2022   TRIG 153.0 (H) 10/28/2021   TRIG 114.0 06/24/2021   Lab Results  Component Value Date   CHOLHDL 4 03/01/2022   CHOLHDL 6 10/28/2021   CHOLHDL 5 06/24/2021   Lab Results  Component Value Date   LDLDIRECT 173.0 03/14/2018      Radiology: No results found.  EKG: SR rate 70 nonspecific ST changes poor R wave progression    ASSESSMENT AND PLAN:   Chest Pain: atypical ECG benign minimal risk factors  Shared decision making favor cardiac CTA to further risk stratify BMET normal 03/01/22 Lopressor 100 mg 2 hours before test HLD:  continue statin ? 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

## 2022-03-02 NOTE — Patient Instructions (Addendum)
Medication Instructions:  Your physician recommends that you continue on your current medications as directed. Please refer to the Current Medication list given to you today.            *If you need a refill on your cardiac medications before your next appointment, please call your pharmacy*   Lab Work:   If you have labs (blood work) drawn today and your tests are completely normal, you will receive your results only by: [place] (if you have MyChart) OR A paper copy in the mail If you have any lab test that is abnormal or we need to change your treatment, we will call you to review the results.  Testing/Procedures: Your physician has requested that you have cardiac CT. Cardiac computed tomography (CT) is a painless test that uses an x-ray machine to take clear, detailed pictures of your heart. For further information please visit [email or web address]. Please follow instruction sheet as given.  Follow-Up: At [facility], you and your health needs are our priority.  As part of our continuing mission to provide you with exceptional heart care, we have created designated Provider Care Teams.  These Care Teams include your primary Cardiologist (physician) and Advanced Practice Providers (APPs -  Physician Assistants and Nurse Practitioners) who all work together to provide you with the care you need, when you need it.  We recommend signing up for the patient portal called "MyChart".  Sign up information is provided on this After Visit Summary.  MyChart is used to connect with patients for Virtual Visits (Telemedicine).  Patients are able to view lab/test results, encounter notes, upcoming appointments, etc.  Non-urgent messages can be sent to your provider as well.   To learn more about what you can do with MyChart, go to [email or web address].    Your next appointment:   As needed  The format for your next appointment:   In Person  Provider:   Jenkins Rouge, MD         Your cardiac CT will be scheduled at one of the below locations:   [facility] [address] [place], [place] [address] [phone]  If scheduled at [facility], please arrive at the [facility] and Children's Entrance (Entrance C2) of [facility] 30 minutes prior to test start time. You can use the FREE valet parking offered at entrance C (encouraged to control the heart rate for the test)  Proceed to the [facility] Radiology Department (first floor) to check-in and test prep.  All radiology patients and guests should use entrance C2 at [facility], accessed from [facility], even though the hospital's physical address listed is [address].    Please follow these instructions carefully (unless otherwise directed):   On the Night Before the Test: Be sure to Drink plenty of water. Do not consume any caffeinated/decaffeinated beverages or chocolate 12 hours prior to your test. Do not take any antihistamines 12 hours prior to your test.  On the Day of the Test: Drink plenty of water until 1 hour prior to the test. Do not eat any food 1 hour prior to test. You may take your regular medications prior to the test.  Take metoprolol (Lopressor) 100 mg two hours prior to test. FEMALES- please wear underwire-free bra if available, avoid dresses & tight clothing      After the Test: Drink plenty of water. After receiving IV contrast, you may experience a mild flushed feeling. This is  normal. On occasion, you may experience a mild rash up to 24 hours after the test. This is not dangerous. If this occurs, you can take Benadryl 25 mg and increase your fluid intake. If you experience trouble breathing, this can be serious. If it is severe call 911 IMMEDIATELY. If it is mild, please call our office.  We will call to schedule your test 2-4 weeks out understanding that some insurance companies will need an authorization prior to  the service being performed.   For non-scheduling related questions, please contact the cardiac imaging nurse navigator should you have any questions/concerns: Marchia Bond, Cardiac Imaging Nurse Navigator Gordy Clement, Cardiac Imaging Nurse Navigator [facility] Direct Office Dial: [phone]   For scheduling needs, including cancellations and rescheduling, please call [place], [phone].   Important Information About Sugar

## 2022-03-07 ENCOUNTER — Telehealth: Payer: Self-pay | Admitting: Internal Medicine

## 2022-03-07 ENCOUNTER — Telehealth (HOSPITAL_BASED_OUTPATIENT_CLINIC_OR_DEPARTMENT_OTHER): Payer: Self-pay | Admitting: *Deleted

## 2022-03-07 NOTE — Telephone Encounter (Signed)
Spoke with office and requested insurance prior authorization for the Echocardiogram ordered by Dr. Nicky Pugh will be updated with authorization information

## 2022-03-07 NOTE — Telephone Encounter (Signed)
Zigmund Daniel at [place] [phone])  Called requesting the insurance PA for an Echo Cardiogram  Please place updates in Epic

## 2022-03-08 ENCOUNTER — Telehealth (HOSPITAL_COMMUNITY): Payer: Self-pay | Admitting: *Deleted

## 2022-03-08 MED ORDER — IVABRADINE HCL 7.5 MG PO TABS: ORAL_TABLET | ORAL | 0 refills | Status: DC

## 2022-03-08 NOTE — Telephone Encounter (Signed)
Reaching out to patient to offer assistance regarding upcoming cardiac imaging study; pt verbalizes understanding of appt date/time, parking situation and where to check in, pre-test NPO status and medications ordered, and verified current allergies; name and call back number provided for further questions should they arise  Gordy Clement RN [facility] [phone] office [phone] cell  After consulting with Dr. Johnsie Cancel, patient to take '50mg'$  metoprolol tartrate and '15mg'$  ivabradine two hours prior to her cardiac CT scan. She is aware to arrive at 8:30am.

## 2022-03-09 ENCOUNTER — Other Ambulatory Visit (HOSPITAL_COMMUNITY): Payer: Self-pay

## 2022-03-09 ENCOUNTER — Other Ambulatory Visit (HOSPITAL_COMMUNITY): Payer: Self-pay | Admitting: *Deleted

## 2022-03-09 MED ORDER — IVABRADINE HCL 5 MG PO TABS
ORAL_TABLET | ORAL | 0 refills | Status: DC
  Filled 2022-03-09: qty 3, 1d supply, fill #0

## 2022-03-10 ENCOUNTER — Ambulatory Visit (HOSPITAL_COMMUNITY)
Admission: RE | Admit: 2022-03-10 | Discharge: 2022-03-10 | Disposition: A | Discharge status: Home / Self Care | Payer: No Typology Code available for payment source | Source: Ambulatory Visit | Attending: Cardiovascular Disease | Admitting: Cardiovascular Disease

## 2022-03-10 DIAGNOSIS — R072 Precordial pain: Secondary | ICD-10-CM

## 2022-03-10 MED ORDER — NITROGLYCERIN 0.4 MG SL SUBL
SUBLINGUAL_TABLET | SUBLINGUAL | Status: AC
  Administered 2022-03-10: 0.8 mg via SUBLINGUAL
  Filled 2022-03-10: qty 2

## 2022-03-10 MED ORDER — IOHEXOL 350 MG/ML SOLN
100.0000 mL | Freq: Once | INTRAVENOUS | Status: AC | PRN
  Administered 2022-03-10: 100 mL via INTRAVENOUS

## 2022-03-10 MED ORDER — NITROGLYCERIN 0.4 MG SL SUBL: 0.8000 mg | SUBLINGUAL_TABLET | SUBLINGUAL | Status: DC | PRN

## 2022-03-30 ENCOUNTER — Ambulatory Visit (INDEPENDENT_AMBULATORY_CARE_PROVIDER_SITE_OTHER): Payer: No Typology Code available for payment source

## 2022-03-30 DIAGNOSIS — R079 Chest pain, unspecified: Secondary | ICD-10-CM

## 2022-03-30 LAB — ECHOCARDIOGRAM COMPLETE
AR max vel: 1.57 cm2
AV Area VTI: 1.77 cm2
AV Area mean vel: 1.64 cm2
AV Mean grad: 6 mmHg
AV Peak grad: 11.2 mmHg
Ao pk vel: 1.67 m/s
Area-P 1/2: 3.85 cm2
S' Lateral: 2.5 cm

## 2022-04-18 ENCOUNTER — Ambulatory Visit: Payer: No Typology Code available for payment source | Admitting: Internal Medicine

## 2022-04-18 VITALS — BP 110/70 | HR 70 | Temp 98.1°F | Wt 161.0 lb

## 2022-04-18 DIAGNOSIS — H66002 Acute suppurative otitis media without spontaneous rupture of ear drum, left ear: Secondary | ICD-10-CM | POA: Diagnosis not present

## 2022-04-18 MED ORDER — AMOXICILLIN-POT CLAVULANATE 875-125 MG PO TABS: 1.0000 | ORAL_TABLET | Freq: Two times a day (BID) | ORAL | 0 refills | Status: AC

## 2022-04-18 NOTE — Progress Notes (Signed)
Established Patient Office Visit     CC/Reason for Visit: Left ear pain  HPI: Victoria Bishop is a 57 y.o. female who is coming in today for the above mentioned reasons.  About 10 days ago she had a URI.  Never sought medical care.  For the past 3 days has been having left ear pressure with left headache and left tinnitus and nausea.  She feels a crackling inside her left ear.   Past Medical/Surgical History: Past Medical History:  Diagnosis Date   Anxiety    Brain tumor ([place]) 2013   Depression    Epilepsy ([place])    GERD (gastroesophageal reflux disease)    Meningitis 2013   Migraines    Seizures ([place])     Past Surgical History:  Procedure Laterality Date   BRAIN SURGERY  2014   CESAREAN SECTION     CT RADIATION THERAPY GUIDE  2014    Social History:  reports that she has never smoked. She has never used smokeless tobacco. She reports current drug use. Drug: Barbituates. She reports that she does not drink alcohol.  Allergies: No Known Allergies  Family History:  Family History  Problem Relation Age of Onset   Hypertension Mother    Hypertension Father    Hyperlipidemia Father    Anxiety disorder Daughter    Other Daughter        vader syndrome   Hearing loss Son    Anxiety disorder Son    Diabetes Neg Hx    Cancer Neg Hx    Breast cancer Neg Hx      Current Outpatient Medications:    amoxicillin-clavulanate (AUGMENTIN) 875-125 MG tablet, Take 1 tablet by mouth 2 (two) times daily for 7 days., Disp: 14 tablet, Rfl: 0   atorvastatin (LIPITOR) 20 MG tablet, Take 1 tablet (20 mg total) by mouth daily., Disp: 90 tablet, Rfl: 3   cyclobenzaprine (FLEXERIL) 10 MG tablet, Take 1 tablet (10 mg total) by mouth 3 (three) times daily as needed for muscle spasms., Disp: 30 tablet, Rfl: 1   gabapentin (NEURONTIN) 100 MG capsule, Take 1 capsule every night, Disp: 90 capsule, Rfl: 3   hydrOXYzine (ATARAX) 25 MG tablet, Take 1 tablet (25 mg total) by mouth 3  (three) times daily as needed for anxiety., Disp: 60 tablet, Rfl: 5   ivabradine (CORLANOR) 5 MG TABS tablet, Take tablets ('15mg'$ ) TWO hours prior to your cardiac CT scan., Disp: 3 tablet, Rfl: 0   ivabradine (CORLANOR) 7.5 MG TABS tablet, Take tablets ('15mg'$ ) TWO hours prior to your cardiac CT scan., Disp: 2 tablet, Rfl: 0   metoprolol tartrate (LOPRESSOR) 100 MG tablet, Take one tablet by mouth 2 hours prior to CT procedure., Disp: 1 tablet, Rfl: 0   sertraline (ZOLOFT) 50 MG tablet, TAKE 3 TABLETS BY MOUTH AT BEDTIME, Disp: 270 tablet, Rfl: 0   SUMAtriptan (IMITREX) 100 MG tablet, TAKE 1 TABLET BY MOUTH AT FIRST SIGN OF MIGRAINE-MAY REPEAT IN 2 HRS-MAX 2 TABS/24 HRS, Disp: 10 tablet, Rfl: 11   topiramate (TOPAMAX) 50 MG tablet, Take 3 tablets in AM, 4 tablets at bedtime, Disp: 210 tablet, Rfl: 11  Review of Systems:  Negative unless indicated in HPI.   Physical Exam: Vitals:   04/18/22 1558  BP: 110/70  Pulse: 70  Temp: 98.1 F (36.7 C)  TempSrc: Oral  SpO2: 97%  Weight: 161 lb (73 kg)    Body mass index is 29.45 kg/m.   Physical Exam  Vitals reviewed.  Constitutional:      Appearance: Normal appearance.  HENT:     Right Ear: Tympanic membrane, ear canal and external ear normal.     Left Ear: External ear normal. Swelling present. A middle ear effusion is present. Tympanic membrane is erythematous and bulging.     Mouth/Throat:     Mouth: Mucous membranes are moist.     Pharynx: Oropharynx is clear.  Eyes:     Conjunctiva/sclera: Conjunctivae normal.     Pupils: Pupils are equal, round, and reactive to light.  Cardiovascular:     Rate and Rhythm: Normal rate and regular rhythm.  Pulmonary:     Effort: Pulmonary effort is normal.     Breath sounds: Normal breath sounds.  Neurological:     Mental Status: She is alert.      Impression and Plan:  Non-recurrent acute suppurative otitis media of left ear without spontaneous rupture of tympanic membrane - Plan:  amoxicillin-clavulanate (AUGMENTIN) 875-125 MG tablet  -She has an apparent otitis media,, send amoxicillin for 1 week.  Time spent:31 minutes reviewing chart, interviewing and examining patient and formulating plan of care.     Lelon Frohlich, MD [facility] at [facility]

## 2022-05-15 ENCOUNTER — Ambulatory Visit: Payer: No Typology Code available for payment source | Admitting: Dermatology

## 2022-05-15 ENCOUNTER — Encounter: Payer: Self-pay | Admitting: Dermatology

## 2022-05-15 DIAGNOSIS — L814 Other melanin hyperpigmentation: Secondary | ICD-10-CM

## 2022-05-15 DIAGNOSIS — L578 Other skin changes due to chronic exposure to nonionizing radiation: Secondary | ICD-10-CM

## 2022-05-15 MED ORDER — AZELAIC ACID 20 % EX CREA: TOPICAL_CREAM | Freq: Two times a day (BID) | CUTANEOUS | 0 refills | Status: AC

## 2022-05-15 NOTE — Progress Notes (Signed)
   New Patient Visit  Subjective  Victoria Bishop is a 57 y.o. female who presents for the following: OTHER (Dark spots on hands and face. Last seen at dermatology about 10 years ago. Places on hands and face done LN other places done with laser. Just spots with laser came back. Tries to wear sunscreen. Using spf 30 when used. Topical creams does not help dark spot).  Accompanied by daughter  Objective  Well appearing patient in no apparent distress; mood and affect are within normal limits.  A focused examination was performed including face and hands. Relevant physical exam findings are noted in the Assessment and Plan.  reticulated light tan macules, fine lines and wrinkles in sun distribution  Head - Anterior (Face), Left Hand - Posterior, Right Hand - Posterior Brown macules    Assessment & Plan  Lentigines (3) Head - Anterior (Face); Left Hand - Posterior; Right Hand - Posterior  Lentigines - Scattered tan macules - Reassured pt lesions re benign however she's bothered by them cosmetically and would like them treated with LN2 since that's what's worked in the past.   Destruction of lesion - Head - Anterior (Face), Left Hand - Posterior, Right Hand - Posterior  Destruction method: cryotherapy   Informed consent: discussed and consent obtained   Timeout:  patient name, date of birth, surgical site, and procedure verified Lesion destroyed using liquid nitrogen: Yes   Outcome: patient tolerated procedure well with no complications   Additional details:  Cosmetic Cryotherpay --> $150 for  Related Medications azelaic acid (AZELEX) 20 % cream Apply topically 2 (two) times daily. After skin is thoroughly washed and patted dry, gently but thoroughly massage a thin film of azelaic acid cream into the affected area twice daily, in the morning and evening.  Actinic skin damage  I counseled the patient regarding the following: Skin Care: Actinic Damage can improve with broad  spectrum sunscreen, sun avoidance, bleaching creams, retinoids, chemical peels and laser. Expectations: Actinic Damage is photo-aging from excessive sun exposure. It manifests as unwanted pigmentation, wrinkles and textural thinning of the skin.  I recommended the following: Broad Spectrum Sunscreen SPF 30+ - SPF 30 daily to face, neck, chest and hands, reapplying every 3 hours when outside for long periods of time   Return in about 3 months (around 08/15/2022).  I, Zigmund Gottron, CMA, am acting as scribe for Talbot Grumbling, MD.   Documentation: I have reviewed the above documentation for accuracy and completeness, and I agree with the above  [place], DO

## 2022-05-15 NOTE — Patient Instructions (Addendum)
Cryotherapy Aftercare  Wash gently with soap and water everyday.   Apply Aquaphor morning and evening to treated areas.

## 2022-05-19 ENCOUNTER — Other Ambulatory Visit: Payer: Self-pay | Admitting: Family Medicine

## 2022-05-31 ENCOUNTER — Ambulatory Visit (INDEPENDENT_AMBULATORY_CARE_PROVIDER_SITE_OTHER): Payer: No Typology Code available for payment source | Admitting: Family Medicine

## 2022-05-31 ENCOUNTER — Encounter: Payer: Self-pay | Admitting: Family Medicine

## 2022-05-31 VITALS — BP 126/80 | HR 71 | Temp 98.1°F | Resp 17 | Ht 62.0 in | Wt 160.1 lb

## 2022-05-31 DIAGNOSIS — M5432 Sciatica, left side: Secondary | ICD-10-CM | POA: Diagnosis not present

## 2022-05-31 MED ORDER — PREDNISONE 10 MG PO TABS: ORAL_TABLET | ORAL | 0 refills | Status: DC

## 2022-05-31 NOTE — Patient Instructions (Signed)
Follow up as needed or as scheduled START the Prednisone as directed- 3 pills at the same time x3 days, then 2 pills at the same time x3 days, then 1 pill daily.  Take w/ food  USE a heating pad to help w/ muscle spasm and pain ADD Tylenol (Acetaminophen) as needed for pain but AVOID any ibuprofen, motrin, aleve, etc Call with any questions or concerns Hang in there!!!

## 2022-05-31 NOTE — Progress Notes (Signed)
   Subjective:    Patient ID: Victoria Bishop, female    DOB: 1966-03-01, 57 y.o.   MRN: [id number]  HPI Back pain- 'i have a pain in my sciatic nerve'.  Started yesterday morning while in the shower.  L sided.  Will radiate to midline of back and all the way down L leg.  Pt has hx of similar.  No bowel or bladder incontinence.  Some numbness/weakness of L leg.  Some relief w/ floor exercises and massage.  Pt attempted to work yesterday but had to leave early due to pain.   Review of Systems For ROS see HPI     Objective:   Physical Exam Vitals reviewed.  Constitutional:      General: She is not in acute distress.    Appearance: She is not ill-appearing.     Comments: Obviously uncomfortable  Cardiovascular:     Pulses: Normal pulses.  Skin:    General: Skin is warm and dry.     Findings: No rash.  Neurological:     Mental Status: She is alert and oriented to person, place, and time.     Comments: + SLR bilaterally  Psychiatric:        Mood and Affect: Mood normal.        Behavior: Behavior normal.        Thought Content: Thought content normal.           Assessment & Plan:  L sided sciatica- new to provider, pt has hx of similar.  Start Prednisone taper due to hx of GERD which will worsen w/ NSAIDs.  Reviewed supportive care and red flags that should prompt return.  Pt expressed understanding and is in agreement w/ plan.

## 2022-06-01 ENCOUNTER — Encounter: Payer: Self-pay | Admitting: Family Medicine

## 2022-06-01 MED ORDER — DICLOFENAC SODIUM 1 % EX GEL: 4.0000 g | Freq: Four times a day (QID) | CUTANEOUS | 0 refills | Status: AC

## 2022-06-27 ENCOUNTER — Ambulatory Visit (INDEPENDENT_AMBULATORY_CARE_PROVIDER_SITE_OTHER): Payer: Self-pay | Admitting: Sports Medicine

## 2022-06-27 ENCOUNTER — Ambulatory Visit (INDEPENDENT_AMBULATORY_CARE_PROVIDER_SITE_OTHER): Payer: Self-pay

## 2022-06-27 VITALS — Ht 62.0 in | Wt 160.0 lb

## 2022-06-27 DIAGNOSIS — M79671 Pain in right foot: Secondary | ICD-10-CM

## 2022-06-27 DIAGNOSIS — S92514A Nondisplaced fracture of proximal phalanx of right lesser toe(s), initial encounter for closed fracture: Secondary | ICD-10-CM

## 2022-06-27 NOTE — Patient Instructions (Addendum)
Good to see you  Tylenol as needed for pain  Wear post op shoe at all times when walking follow up in 5 weeks

## 2022-06-27 NOTE — Progress Notes (Signed)
Victoria Bishop D.Victoria Bishop [address] [place] [address] Phone: [phone]   Assessment and Plan:    1. Right foot pain 2. Closed nondisplaced fracture of proximal phalanx of lesser toe of right foot, initial encounter  -Acute, initial sports Bishop visit - X-ray obtained in clinic.  My interpretation: Closed, nondisplaced fracture of proximal phalanx of fifth toe of right foot.  Injury occurred 1 week ago - No signs of neurovascular compromise, no open lesion, with fracture fairly well-approximated on x-ray.  With no red flag signs, we will not refer to orthopedic surgery and we will continue with conservative therapy - Patient provided with postop shoe at today's visit.  Recommend wearing postop shoe whenever weightbearing for the next 5 weeks - Recommend using Tylenol as needed for day-to-day pain relief and may use NSAIDs as needed for breakthrough pain.  Pertinent previous records reviewed include none   Follow Up: 5 weeks for reevaluation.  If patient still has TTP, would repeat x-ray.  If NTTP, could discontinue postop shoe   Subjective:   I, Victoria Bishop, am serving as a Neurosurgeon for Doctor Richardean Sale  Chief Complaint: right foot pain   HPI:   06/27/22 Patient is a 57 year old female complaining of right foot pain. Patient states that she kicked something in her room, this happened last Wednesday , no meds for the pain, she put cream and wrap on it , she has pain on her 4th met sh is able to press on the bone , no antalgic gait,   Relevant Historical Information: GERD  Additional pertinent review of systems negative.   Current Outpatient Medications:    atorvastatin (LIPITOR) 20 MG tablet, Take 1 tablet (20 mg total) by mouth daily., Disp: 90 tablet, Rfl: 3   azelaic acid (AZELEX) 20 % cream, Apply topically 2 (two) times daily. After skin is thoroughly washed and patted dry, gently but thoroughly massage a thin film  of azelaic acid cream into the affected area twice daily, in the morning and evening., Disp: 30 g, Rfl: 0   cyclobenzaprine (FLEXERIL) 10 MG tablet, Take 1 tablet (10 mg total) by mouth 3 (three) times daily as needed for muscle spasms., Disp: 30 tablet, Rfl: 1   diclofenac Sodium (VOLTAREN) 1 % GEL, Apply 4 g topically 4 (four) times daily., Disp: 150 g, Rfl: 0   gabapentin (NEURONTIN) 100 MG capsule, Take 1 capsule every night, Disp: 90 capsule, Rfl: 3   hydrOXYzine (ATARAX) 25 MG tablet, Take 1 tablet (25 mg total) by mouth 3 (three) times daily as needed for anxiety., Disp: 60 tablet, Rfl: 5   ivabradine (CORLANOR) 5 MG TABS tablet, Take tablets ( ) TWO hours prior to your cardiac CT scan., Disp: 3 tablet, Rfl: 0   ivabradine (CORLANOR) 7.5 MG TABS tablet, Take tablets ( ) TWO hours prior to your cardiac CT scan., Disp: 2 tablet, Rfl: 0   metoprolol tartrate (LOPRESSOR) 100 MG tablet, Take one tablet by mouth 2 hours prior to CT procedure., Disp: 1 tablet, Rfl: 0   predniSONE (DELTASONE) 10 MG tablet, 3 tabs x3 days and then 2 tabs x3 days and then 1 tab x3 days.  Take w/ food., Disp: 18 tablet, Rfl: 0   sertraline (ZOLOFT) 50 MG tablet, TAKE 3 TABLETS BY MOUTH AT BEDTIME, Disp: 270 tablet, Rfl: 0   SUMAtriptan (IMITREX) 100 MG tablet, TAKE 1 TABLET BY MOUTH AT FIRST SIGN OF MIGRAINE-MAY REPEAT IN 2 HRS-MAX 2  TABS/24 HRS, Disp: 10 tablet, Rfl: 11   topiramate (TOPAMAX) 50 MG tablet, Take 3 tablets in AM, 4 tablets at bedtime, Disp: 210 tablet, Rfl: 11   Objective:     Vitals:   06/27/22 1247  Weight: 160 lb (72.6 kg)  Height:  (1.575 m)      Body mass index is 29.26 kg/m.    Physical Exam:    Gen: Appears well, nad, nontoxic and pleasant Psych: Alert and oriented, appropriate mood and affect Neuro: sensation intact, strength is 5/5 with df/pf/inv/ev, muscle tone wnl Skin: no susupicious lesions or rashes  Right foot/ankle:  No deformity, no swelling or effusion TTP  significantly to fifth toe with surrounding swelling and ecchymosis.  Ecchymosis traveling into third and fourth toes without significant TTP NTTP over fibular head, lat mal, medial mal, achilles, navicular, base of 5th, ATFL, CFL, deltoid, calcaneous or midfoot Motion intact in all toes Neurovascularly intact in all toes    Electronically signed by:  Victoria Bishop D.Victoria Bishop 1:21 PM 06/27/22

## 2022-07-31 NOTE — Progress Notes (Deleted)
    Victoria Bishop D.[facility] [address] [place] [address] Phone: [phone]   Assessment and Plan:     There are no diagnoses linked to this encounter.  ***   Pertinent previous records reviewed include ***   Follow Up: ***     Subjective:   I, Victoria Bishop, am serving as a Neurosurgeon for Doctor Richardean Sale   Chief Complaint: right foot pain    HPI:    06/27/22 Patient is a 57 year old female complaining of right foot pain. Patient states that she kicked something in her room, this happened last Wednesday , no meds for the pain, she put cream and wrap on it , she has pain on her 4th met sh is able to press on the bone , no antalgic gait,   08/01/2022 Patient states    Relevant Historical Information: GERD  Additional pertinent review of systems negative.   Current Outpatient Medications:    atorvastatin (LIPITOR) 20 MG tablet, Take 1 tablet (20 mg total) by mouth daily., Disp: 90 tablet, Rfl: 3   azelaic acid (AZELEX) 20 % cream, Apply topically 2 (two) times daily. After skin is thoroughly washed and patted dry, gently but thoroughly massage a thin film of azelaic acid cream into the affected area twice daily, in the morning and evening., Disp: 30 g, Rfl: 0   cyclobenzaprine (FLEXERIL) 10 MG tablet, Take 1 tablet (10 mg total) by mouth 3 (three) times daily as needed for muscle spasms., Disp: 30 tablet, Rfl: 1   diclofenac Sodium (VOLTAREN) 1 % GEL, Apply 4 g topically 4 (four) times daily., Disp: 150 g, Rfl: 0   gabapentin (NEURONTIN) 100 MG capsule, Take 1 capsule every night, Disp: 90 capsule, Rfl: 3   hydrOXYzine (ATARAX) 25 MG tablet, Take 1 tablet (25 mg total) by mouth 3 (three) times daily as needed for anxiety., Disp: 60 tablet, Rfl: 5   ivabradine (CORLANOR) 5 MG TABS tablet, Take tablets (15mg ) TWO hours prior to your cardiac CT scan., Disp: 3 tablet, Rfl: 0   ivabradine (CORLANOR) 7.5 MG TABS tablet, Take tablets  (15mg ) TWO hours prior to your cardiac CT scan., Disp: 2 tablet, Rfl: 0   metoprolol tartrate (LOPRESSOR) 100 MG tablet, Take one tablet by mouth 2 hours prior to CT procedure., Disp: 1 tablet, Rfl: 0   predniSONE (DELTASONE) 10 MG tablet, 3 tabs x3 days and then 2 tabs x3 days and then 1 tab x3 days.  Take w/ food., Disp: 18 tablet, Rfl: 0   sertraline (ZOLOFT) 50 MG tablet, TAKE 3 TABLETS BY MOUTH AT BEDTIME, Disp: 270 tablet, Rfl: 0   SUMAtriptan (IMITREX) 100 MG tablet, TAKE 1 TABLET BY MOUTH AT FIRST SIGN OF MIGRAINE-MAY REPEAT IN 2 HRS-MAX 2 TABS/24 HRS, Disp: 10 tablet, Rfl: 11   topiramate (TOPAMAX) 50 MG tablet, Take 3 tablets in AM, 4 tablets at bedtime, Disp: 210 tablet, Rfl: 11   Objective:     There were no vitals filed for this visit.    There is no height or weight on file to calculate BMI.    Physical Exam:    ***   Electronically signed by:  Victoria Bishop D.[facility] 11:51 AM 07/31/22

## 2022-08-01 ENCOUNTER — Ambulatory Visit: Payer: Self-pay | Admitting: Sports Medicine

## 2022-08-11 ENCOUNTER — Other Ambulatory Visit: Payer: Self-pay

## 2022-08-11 ENCOUNTER — Encounter (HOSPITAL_COMMUNITY): Payer: Self-pay

## 2022-08-11 ENCOUNTER — Emergency Department (HOSPITAL_COMMUNITY)
Admission: EM | Admit: 2022-08-11 | Discharge: 2022-08-11 | Disposition: A | Discharge status: Home / Self Care | Payer: 59 | Attending: Emergency Medicine | Admitting: Emergency Medicine

## 2022-08-11 DIAGNOSIS — R55 Syncope and collapse: Secondary | ICD-10-CM | POA: Insufficient documentation

## 2022-08-11 DIAGNOSIS — F439 Reaction to severe stress, unspecified: Secondary | ICD-10-CM | POA: Diagnosis not present

## 2022-08-11 DIAGNOSIS — F430 Acute stress reaction: Secondary | ICD-10-CM

## 2022-08-11 LAB — BASIC METABOLIC PANEL
Anion gap: 8 (ref 5–15)
BUN: 17 mg/dL (ref 6–20)
CO2: 22 mmol/L (ref 22–32)
Calcium: 9 mg/dL (ref 8.9–10.3)
Chloride: 108 mmol/L (ref 98–111)
Creatinine, Ser: 0.82 mg/dL (ref 0.44–1.00)
GFR, Estimated: >60 mL/min (ref >60)
Glucose, Bld: 102 mg/dL — ABNORMAL HIGH (ref 70–99)
Potassium: 3.8 mmol/L (ref 3.5–5.1)
Sodium: 138 mmol/L (ref 135–145)

## 2022-08-11 LAB — CBC
HCT: 38.1 % (ref 36.0–46.0)
Hemoglobin: 12.7 g/dL (ref 12.0–15.0)
MCH: 30.3 pg (ref 26.0–34.0)
MCHC: 33.3 g/dL (ref 30.0–36.0)
MCV: 90.9 fL (ref 80.0–100.0)
Platelets: 271 10*3/uL (ref 150–400)
RBC: 4.19 MIL/uL (ref 3.87–5.11)
RDW: 12.2 % (ref 11.5–15.5)
WBC: 5.3 10*3/uL (ref 4.0–10.5)
nRBC: 0 % (ref 0.0–0.2)

## 2022-08-11 LAB — TROPONIN I (HIGH SENSITIVITY): Troponin I (High Sensitivity): 5 ng/L (ref <18)

## 2022-08-11 LAB — CBG MONITORING, ED: Glucose-Capillary: 105 mg/dL — ABNORMAL HIGH (ref 70–99)

## 2022-08-11 NOTE — ED Provider Notes (Addendum)
[place] EMERGENCY DEPARTMENT AT [facility] Provider Note   CSN: [id number] Arrival date & time: 08/11/22  1219     History  Chief Complaint  Patient presents with   Loss of Consciousness    Victoria Bishop is a 57 y.o. female.  Pt c/o feeling faint, ?syncopal event at work. Indicates she feels a lot of stress at work and got to feeling faint/lightheaded. No seizure activity noted. Pt not confused afterwards. Notes general weakness. No focal or unilateral weakness, or numbness. No change in speech or vision. No headache. No neck pain. Denies chest pain or discomfort. No sob. No palpitations. No abd pain or nvd. No dysuria or gu c/o. No other recent fainting episodes. Had eaten/drank earlier today. No change in meds.  No recent blood loss.   The history is provided by the patient, medical records and the EMS personnel.  Loss of Consciousness Associated symptoms: no chest pain, no fever, no headaches, no palpitations, no shortness of breath and no vomiting        Home Medications Prior to Admission medications   Medication Sig Start Date End Date Taking? Authorizing Provider  atorvastatin (LIPITOR) 20 MG tablet Take 1 tablet (20 mg total) by mouth daily. 06/24/21   Jarold Motto, PA  azelaic acid (AZELEX) 20 % cream Apply topically 2 (two) times daily. After skin is thoroughly washed and patted dry, gently but thoroughly massage a thin film of azelaic acid cream into the affected area twice daily, in the morning and evening. 05/15/22   Terri Piedra, DO  cyclobenzaprine (FLEXERIL) 10 MG tablet Take 1 tablet (10 mg total) by mouth 3 (three) times daily as needed for muscle spasms. 01/23/18   Daphine Deutscher, Mary-Margaret, FNP  diclofenac Sodium (VOLTAREN) 1 % GEL Apply 4 g topically 4 (four) times daily. 06/01/22   Sheliah Hatch, MD  gabapentin (NEURONTIN) 100 MG capsule Take 1 capsule every night 02/24/22   Van Clines, MD  hydrOXYzine (ATARAX) 25 MG tablet Take  1 tablet (25 mg total) by mouth 3 (three) times daily as needed for anxiety. 05/09/21   Willow Ora, MD  ivabradine (CORLANOR) 5 MG TABS tablet Take tablets (15mg ) TWO hours prior to your cardiac CT scan. 03/09/22   Wendall Stade, MD  ivabradine (CORLANOR) 7.5 MG TABS tablet Take tablets (15mg ) TWO hours prior to your cardiac CT scan. 03/08/22   Wendall Stade, MD  metoprolol tartrate (LOPRESSOR) 100 MG tablet Take one tablet by mouth 2 hours prior to CT procedure. 03/02/22   Wendall Stade, MD  predniSONE (DELTASONE) 10 MG tablet 3 tabs x3 days and then 2 tabs x3 days and then 1 tab x3 days.  Take w/ food. 05/31/22   Sheliah Hatch, MD  sertraline (ZOLOFT) 50 MG tablet TAKE 3 TABLETS BY MOUTH AT BEDTIME 05/22/22   Willow Ora, MD  SUMAtriptan (IMITREX) 100 MG tablet TAKE 1 TABLET BY MOUTH AT FIRST SIGN OF MIGRAINE-MAY REPEAT IN 2 HRS-MAX 2 TABS/24 HRS 02/24/22   Van Clines, MD  topiramate (TOPAMAX) 50 MG tablet Take 3 tablets in AM, 4 tablets at bedtime 02/24/22   Van Clines, MD      Allergies    Patient has no known allergies.    Review of Systems   Review of Systems  Constitutional:  Negative for chills and fever.  HENT:  Negative for sore throat.   Eyes:  Negative for pain, redness and visual disturbance.  Respiratory:  Negative for cough and shortness of breath.   Cardiovascular:  Positive for syncope. Negative for chest pain, palpitations and leg swelling.  Gastrointestinal:  Negative for abdominal pain, blood in stool and vomiting.  Genitourinary:  Negative for dysuria and flank pain.  Musculoskeletal:  Negative for back pain and neck pain.  Skin:  Negative for rash.  Neurological:  Negative for speech difficulty, numbness and headaches.  Hematological:  Does not bruise/bleed easily.    Physical Exam Updated Vital Signs BP 118/72   Pulse 64   Temp 98.4 F (36.9 C) (Oral)   Resp 14   Ht 1.575 m (5\' 2" )   Wt 72.6 kg   LMP 07/12/2014 (LMP Unknown)    SpO2 98%   BMI 29.26 kg/m  Physical Exam Vitals and nursing note reviewed.  Constitutional:      Appearance: Normal appearance. She is well-developed.  HENT:     Head: Atraumatic.     Nose: Nose normal.     Mouth/Throat:     Mouth: Mucous membranes are moist.  Eyes:     General: No scleral icterus.    Extraocular Movements: Extraocular movements intact.     Conjunctiva/sclera: Conjunctivae normal.     Pupils: Pupils are equal, round, and reactive to light.  Neck:     Vascular: No carotid bruit.     Trachea: No tracheal deviation.  Cardiovascular:     Rate and Rhythm: Normal rate and regular rhythm.     Pulses: Normal pulses.     Heart sounds: Normal heart sounds. No murmur heard.    No friction rub. No gallop.  Pulmonary:     Effort: Pulmonary effort is normal. No respiratory distress.     Breath sounds: Normal breath sounds.  Abdominal:     General: Bowel sounds are normal. There is no distension.     Palpations: Abdomen is soft. There is no mass.     Tenderness: There is no abdominal tenderness. There is no guarding.  Genitourinary:    Comments: No cva tenderness.  Musculoskeletal:        General: No swelling or tenderness.     Cervical back: Normal range of motion and neck supple. No rigidity or tenderness. No muscular tenderness.     Right lower leg: No edema.     Left lower leg: No edema.  Skin:    General: Skin is warm and dry.     Findings: No rash.  Neurological:     Mental Status: She is alert.     Comments: Alert, speech normal. Motor/sens grossly intact bil.   Psychiatric:        Mood and Affect: Mood normal.     ED Results / Procedures / Treatments   Labs (all labs ordered are listed, but only abnormal results are displayed) Results for orders placed or performed during the hospital encounter of 08/11/22  Basic metabolic panel  Result Value Ref Range   Sodium 138 135 - 145 mmol/L   Potassium 3.8 3.5 - 5.1 mmol/L   Chloride 108 98 - 111 mmol/L    CO2 22 22 - 32 mmol/L   Glucose, Bld 102 (H) 70 - 99 mg/dL   BUN 17 6 - 20 mg/dL   Creatinine, Ser 1.61 0.44 - 1.00 mg/dL   Calcium 9.0 8.9 - 09.6 mg/dL   GFR, Estimated >04 >54 mL/min   Anion gap 8 5 - 15  CBC  Result Value Ref Range   WBC 5.3 4.0 -  10.5 K/uL   RBC 4.19 3.87 - 5.11 MIL/uL   Hemoglobin 12.7 12.0 - 15.0 g/dL   HCT 16.1 09.6 - 04.5 %   MCV 90.9 80.0 - 100.0 fL   MCH 30.3 26.0 - 34.0 pg   MCHC 33.3 30.0 - 36.0 g/dL   RDW 40.9 81.1 - 91.4 %   Platelets 271 150 - 400 K/uL   nRBC 0.0 0.0 - 0.2 %  CBG monitoring, ED  Result Value Ref Range   Glucose-Capillary 105 (H) 70 - 99 mg/dL  Troponin I (High Sensitivity)  Result Value Ref Range   Troponin I (High Sensitivity) 5 <18 ng/L    EKG EKG Interpretation  Date/Time:  Friday Aug 11 2022 12:27:32 EDT Ventricular Rate:  74 PR Interval:  150 QRS Duration: 80 QT Interval:  380 QTC Calculation: 422 R Axis:   21 Text Interpretation: Sinus rhythm Nonspecific T wave abnormality Confirmed by Cathren Laine ([id number]) on 08/11/2022 12:32:09 PM  Radiology No results found.  Procedures Procedures    Medications Ordered in ED Medications - No data to display  ED Course/ Medical Decision Making/ A&P                             Medical Decision Making Problems Addressed: Near syncope: acute illness or injury with systemic symptoms that poses a threat to life or bodily functions Stress: acute illness or injury    Details: Acute/chronic Syncope and collapse: acute illness or injury with systemic symptoms that poses a threat to life or bodily functions  Amount and/or Complexity of Data Reviewed Independent Historian: EMS    Details: hx External Data Reviewed: notes. Labs: ordered. Decision-making details documented in ED Course. ECG/medicine tests: ordered and independent interpretation performed. Decision-making details documented in ED Course.  Risk Decision regarding hospitalization.   Iv ns. Continuous pulse  ox and cardiac monitoring. Labs ordered/sent.   Differential diagnosis includes near syncope, syncope, anemia, dehydration, vasovagal, etc. Dispo decision including potential need for admission considered - will get labs and reassess.   Reviewed nursing notes and prior charts for additional history. External reports reviewed. Additional history from: EMS.  Cardiac monitor: sinus rhythm, rate 69.  Labs reviewed/interpreted by me - chem normal. Wbc and hgb normal. Trop normal.  Recheck, no faintness or dizziness. No chest pain or sob.   Po fluids/food. Ambulate in hall. No faintness.  Additional hx from family - reports that w today's episode, but under a lot of stress, and that she responded to ammonia under her nose. No generalized seizure activity or postictal period noted, no incontinence, no oral injury.  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   Pt currently appears stable for d/c.   Rec pcp f/u.  Return precautions provided.              Final Clinical Impression(s) / ED Diagnoses Final diagnoses:  Near syncope  Syncope and collapse  Stress    Rx / DC Orders ED Discharge Orders     None          Cathren Laine, MD 08/11/22 1425

## 2022-08-11 NOTE — Discharge Instructions (Addendum)
It was our pleasure to provide your ER care today - we hope that you feel better.  Rest. Drink plenty of fluids/stay well hydrated.  For stress, try relaxation/mindfulness techniques, and may take atarax as need for symptom relief.  Discuss with your work the ability to take periodic breaks or other strategies to help alleviate stress at work.   Follow up closely with primary care doctor and neurologist in the coming week.  Return to ER if worse, new symptoms, new/severe pain, fevers, weak/fainting, chest pain, trouble breathing, or other concern.

## 2022-08-11 NOTE — ED Triage Notes (Signed)
BIB EMS from work for syncopal episode that was witnessed while in chair. Pt was lowered from chair onto ground, no injury or falls. C/o weakness. Hx of seizures and anxiety. Increased stress at work.

## 2022-08-11 NOTE — ED Notes (Signed)
Pt ambulatory to bathroom with standby assist. Pt tolerates ambulation

## 2022-08-11 NOTE — ED Notes (Signed)
Pt tolerated food and fluids

## 2022-08-22 ENCOUNTER — Telehealth: Payer: Self-pay | Admitting: Neurology

## 2022-08-22 NOTE — Telephone Encounter (Signed)
Pt called she needed to be see for her Follow-up in 8 months. Pt transferred to the front to be rescheduled

## 2022-08-22 NOTE — Telephone Encounter (Signed)
Patient would like a call back to figure out why she was supposed to be seen 08/25/22. She said she didn't make this appt and she wants to know if she needs to resch it.

## 2022-08-25 ENCOUNTER — Ambulatory Visit: Payer: Self-pay | Admitting: Neurology

## 2022-09-05 ENCOUNTER — Other Ambulatory Visit: Payer: Self-pay | Admitting: Family Medicine

## 2022-10-26 ENCOUNTER — Ambulatory Visit: Payer: No Typology Code available for payment source | Admitting: Neurology

## 2022-10-30 ENCOUNTER — Other Ambulatory Visit: Payer: Self-pay | Admitting: Family Medicine

## 2022-12-07 ENCOUNTER — Encounter: Payer: Self-pay | Admitting: Neurology

## 2022-12-11 ENCOUNTER — Ambulatory Visit (INDEPENDENT_AMBULATORY_CARE_PROVIDER_SITE_OTHER): Payer: 59 | Admitting: Internal Medicine

## 2022-12-11 ENCOUNTER — Encounter: Payer: Self-pay | Admitting: Internal Medicine

## 2022-12-11 VITALS — BP 110/70 | HR 88 | Temp 98.4°F | Wt 160.4 lb

## 2022-12-11 DIAGNOSIS — Z566 Other physical and mental strain related to work: Secondary | ICD-10-CM

## 2022-12-11 NOTE — Progress Notes (Signed)
Established Patient Office Visit     CC/Reason for Visit: Requesting referral to behavioral services  HPI: Victoria Bishop is a 57 y.o. female who is coming in today for the above mentioned reasons.  She is having some stress at work.  She feels she is being targeted by her employers due to her medical conditions and her Hispanic ethnicity.  She is requesting a referral to psychology as she believes this is starting to affect her sleep and her mental health.  Past Medical/Surgical History: Past Medical History:  Diagnosis Date   Anxiety    Brain tumor (HCC) 2013   Depression    Epilepsy (HCC)    GERD (gastroesophageal reflux disease)    Meningitis 2013   Migraines    Seizures (HCC)     Past Surgical History:  Procedure Laterality Date   BRAIN SURGERY  2014   CESAREAN SECTION     CT RADIATION THERAPY GUIDE  2014    Social History:  reports that she has never smoked. She has never used smokeless tobacco. She reports current drug use. Drug: Barbituates. She reports that she does not drink alcohol.  Allergies: No Known Allergies  Family History:  Family History  Problem Relation Age of Onset   Hypertension Mother    Hypertension Father    Hyperlipidemia Father    Anxiety disorder Daughter    Other Daughter        vader syndrome   Hearing loss Son    Anxiety disorder Son    Diabetes Neg Hx    Cancer Neg Hx    Breast cancer Neg Hx      Current Outpatient Medications:    atorvastatin (LIPITOR) 20 MG tablet, Take 1 tablet (20 mg total) by mouth daily., Disp: 90 tablet, Rfl: 3   azelaic acid (AZELEX) 20 % cream, Apply topically 2 (two) times daily. After skin is thoroughly washed and patted dry, gently but thoroughly massage a thin film of azelaic acid cream into the affected area twice daily, in the morning and evening., Disp: 30 g, Rfl: 0   cyclobenzaprine (FLEXERIL) 10 MG tablet, Take 1 tablet (10 mg total) by mouth 3 (three) times daily as needed for  muscle spasms., Disp: 30 tablet, Rfl: 1   diclofenac Sodium (VOLTAREN) 1 % GEL, Apply 4 g topically 4 (four) times daily., Disp: 150 g, Rfl: 0   gabapentin (NEURONTIN) 100 MG capsule, Take 1 capsule every night, Disp: 90 capsule, Rfl: 3   hydrOXYzine (ATARAX) 25 MG tablet, Take 1 tablet (25 mg total) by mouth 3 (three) times daily as needed for anxiety., Disp: 60 tablet, Rfl: 5   ivabradine (CORLANOR) 5 MG TABS tablet, Take tablets (15mg ) TWO hours prior to your cardiac CT scan., Disp: 3 tablet, Rfl: 0   ivabradine (CORLANOR) 7.5 MG TABS tablet, Take tablets (15mg ) TWO hours prior to your cardiac CT scan., Disp: 2 tablet, Rfl: 0   metoprolol tartrate (LOPRESSOR) 100 MG tablet, Take one tablet by mouth 2 hours prior to CT procedure., Disp: 1 tablet, Rfl: 0   predniSONE (DELTASONE) 10 MG tablet, 3 tabs x3 days and then 2 tabs x3 days and then 1 tab x3 days.  Take w/ food., Disp: 18 tablet, Rfl: 0   sertraline (ZOLOFT) 50 MG tablet, TAKE 3 TABLETS BY MOUTH AT BEDTIME, Disp: 270 tablet, Rfl: 0   SUMAtriptan (IMITREX) 100 MG tablet, TAKE 1 TABLET BY MOUTH AT FIRST SIGN OF MIGRAINE-MAY REPEAT IN 2 HRS-MAX  2 TABS/24 HRS, Disp: 10 tablet, Rfl: 11   topiramate (TOPAMAX) 50 MG tablet, Take 3 tablets in AM, 4 tablets at bedtime, Disp: 210 tablet, Rfl: 11  Review of Systems:  Negative unless indicated in HPI.   Physical Exam: Vitals:   12/11/22 0925  BP: 110/70  Pulse: 88  Temp: 98.4 F (36.9 C)  TempSrc: Oral  SpO2: 94%  Weight: 160 lb 6.4 oz (72.8 kg)    Body mass index is 29.34 kg/m.   Physical Exam Neurological:     General: No focal deficit present.     Mental Status: She is oriented to person, place, and time.  Psychiatric:        Thought Content: Thought content normal.        Judgment: Judgment normal.      Impression and Plan:  Stress at work -     Ambulatory referral to Psychology   -She is currently requesting referral to psychology, CBT given her work  stressors.   Time spent:21 minutes reviewing chart, interviewing and examining patient and formulating plan of care.     Chaya Jan, MD [facility]

## 2022-12-12 ENCOUNTER — Encounter (HOSPITAL_COMMUNITY): Payer: Self-pay

## 2022-12-12 ENCOUNTER — Emergency Department (HOSPITAL_COMMUNITY)
Admission: EM | Admit: 2022-12-12 | Discharge: 2022-12-12 | Discharge status: Against Medical Advice | Payer: 59 | Attending: Emergency Medicine | Admitting: Emergency Medicine

## 2022-12-12 ENCOUNTER — Other Ambulatory Visit: Payer: Self-pay

## 2022-12-12 DIAGNOSIS — R569 Unspecified convulsions: Secondary | ICD-10-CM | POA: Insufficient documentation

## 2022-12-12 DIAGNOSIS — Z5321 Procedure and treatment not carried out due to patient leaving prior to being seen by health care provider: Secondary | ICD-10-CM | POA: Diagnosis not present

## 2022-12-12 LAB — CBG MONITORING, ED: Glucose-Capillary: 106 mg/dL — ABNORMAL HIGH (ref 70–99)

## 2022-12-12 NOTE — ED Triage Notes (Signed)
Patient BIB EMS for seizure. Patient was in bosses office receiving disciplinary paper work when she slumped over reports it as a seizure. Patient has hx of stress induced seizures. Patient is drowsy and A&Ox4 on arrival.

## 2023-01-11 ENCOUNTER — Ambulatory Visit: Payer: 59 | Admitting: Psychology

## 2023-01-11 DIAGNOSIS — F331 Major depressive disorder, recurrent, moderate: Secondary | ICD-10-CM | POA: Diagnosis not present

## 2023-01-11 DIAGNOSIS — F419 Anxiety disorder, unspecified: Secondary | ICD-10-CM
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
## 2023-02-14 ENCOUNTER — Ambulatory Visit: Payer: 59 | Admitting: Psychology

## 2023-02-14 DIAGNOSIS — F419 Anxiety disorder, unspecified: Secondary | ICD-10-CM | POA: Diagnosis not present

## 2023-02-14 DIAGNOSIS — F331 Major depressive disorder, recurrent, moderate: Secondary | ICD-10-CM | POA: Diagnosis not present
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
## 2023-02-27 ENCOUNTER — Other Ambulatory Visit: Payer: Self-pay | Admitting: Family

## 2023-02-27 ENCOUNTER — Other Ambulatory Visit: Payer: Self-pay | Admitting: Neurology

## 2023-02-27 DIAGNOSIS — G40209 Localization-related (focal) (partial) symptomatic epilepsy and epileptic syndromes with complex partial seizures, not intractable, without status epilepticus: Secondary | ICD-10-CM

## 2023-02-28 ENCOUNTER — Ambulatory Visit: Payer: 59 | Admitting: Psychology

## 2023-02-28 DIAGNOSIS — F331 Major depressive disorder, recurrent, moderate: Secondary | ICD-10-CM | POA: Diagnosis not present

## 2023-02-28 DIAGNOSIS — F419 Anxiety disorder, unspecified: Secondary | ICD-10-CM | POA: Diagnosis not present
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
## 2023-03-12 ENCOUNTER — Ambulatory Visit (INDEPENDENT_AMBULATORY_CARE_PROVIDER_SITE_OTHER): Payer: 59 | Admitting: Neurology

## 2023-03-12 ENCOUNTER — Encounter: Payer: Self-pay | Admitting: Neurology

## 2023-03-12 VITALS — BP 101/69 | HR 90 | Ht 62.0 in | Wt 160.6 lb

## 2023-03-12 DIAGNOSIS — G43009 Migraine without aura, not intractable, without status migrainosus: Secondary | ICD-10-CM

## 2023-03-12 DIAGNOSIS — G40209 Localization-related (focal) (partial) symptomatic epilepsy and epileptic syndromes with complex partial seizures, not intractable, without status epilepticus: Secondary | ICD-10-CM | POA: Diagnosis not present

## 2023-03-12 MED ORDER — TOPIRAMATE 50 MG PO TABS: ORAL_TABLET | ORAL | 11 refills | Status: DC

## 2023-03-12 MED ORDER — GABAPENTIN 100 MG PO CAPS: ORAL_CAPSULE | ORAL | 3 refills | Status: DC

## 2023-03-12 MED ORDER — SUMATRIPTAN SUCCINATE 100 MG PO TABS: ORAL_TABLET | ORAL | 11 refills | Status: DC

## 2023-03-12 NOTE — Progress Notes (Signed)
NEUROLOGY FOLLOW UP OFFICE NOTE  QUIN BLANKINSHIP [id number] 06-Apr-1965  HISTORY OF PRESENT ILLNESS: I had the pleasure of seeing Victoria Bishop in follow-up in the neurology clinic on 03/12/2023.  The patient was last seen a year ago for recurrent seizures and migraines. She is alone in the office today. Records and images were personally reviewed where available.  She was in the ER in 07/2022 for feeling faint, reporting a lot of stress at work and starting to feel faint/lightheaded, no post-event confusion. On 12/12/22, notes indicate she was in the bosses office receiving disciplinary paperwork when she slumped over and reports it as a seizure. She left without being seen. She reports she was under a lot of pressure from her supervisor at that time and did lose consciousness, told she had a seizure. She hit her head and the right side of her body. She continues on Topiramate 150mg  in AM, 200mg  in PM and Gabapentin 100mg  at bedtime. Migraines overall stable, last migraine was Christmas day after drinking a little wine the night prior. She has prn sumatriptan for migraine rescue. She denies any olfactory/gustatory hallucinations, focal numbness/tingling/weakness. She reports left arm jerking not very often, but having it more when she was under a lot of stress a work. She is now in a different school with a better working environment.    History on Initial Assessment 05/11/2017: This is a 57 yo RH woman presenting for migraines, meningioma resection, and seizures. She recently moved from [place], records from her Neurologist in [place], [place] were reviewed. She started having seizures in July 2000 while still living in [place]. At that time she had eclampsia during delivery, she had pain radiating to her head then lost awareness. She was admitted to the hospital for a month and had renal failure requiring dialysis. Per report, 3 days later, she had seizures. There is an MRI brain report from July 2000 was  concerning for PRES, reporting abnormal foci of signal intensity involving the cortical and subcortical regions of the posterior parietal and occipital lobes in a fairly symmetrical fashion. The largest area of abnormal signal involves the left posterior parietal lobe. Initially she was having episodes of loss of consciousness with convulsions. She was initially started on Tegretol and Dilantin immediately post-partum, and had been very well controlled on carbamazepine XR for several years. There is an EEG report from 11/2005 quoted from [facility] reporting "epileptiform activity generalized, although there was not significant associated clinical motor activity." She had an EEG in [place] in 04/2006 reporting occasional sharp and slow wave complexes that appear epileptiform over the left anterior temporal regions. They deny any convulsions since 2000. Since then, she has been having recurrent stereotyped episodes starting with left-sided numbness and tingling on the arm, leg, face, and left side of her tongue. The left side of her head "feels like it is going weak." She describes electric shocks. There is no jerking or twitching but she describes them as spasms, then she feels weaker on the left side after. She cannot concentrate when this occurs, she speaks but slows down a lot, keeps saying "uhm" for several minutes. Apparently she can still function and do things when this occurs. She is sleepy afterwards and wakes up feeling better, but her daughter notices it takes a couple of days to return to her normal baseline. There is no associated headaches. She has difficulty saying how often they are, sometimes occurring twice a week, other times every other week. Last episode was 1.5  weeks ago. When she has them at night, she has really bad dreams. Her husband thinks she is having nightmares but when she wakes up she feels different. She denies any olfactory/gustatory hallucinations, rising epigastric sensation. She has  noticed some gaps in time, her daughter has to repeat things some times.    Records from [place] were reviewed. She was transitioned off carbamazepine to zonisamide in 2010 due to concerns for osteoporosis, however had abdominal pain and stomach burning, and was switched to Levetiracetam. 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 She reports that headaches worsened after the surgery and was started on Topiramate in 2015. She feels this has helped with the headaches but not the seizures. She is taking Topiramate 50mg  BID. Higher doses in the past caused side effects. She reports stopping the Keppra last February 2018 because her mood swings were horrible and anxiety was heightened. This has increased seizures frequency, but the anxiety and mood swings are better. Her Zoloft dose was also increased. She takes sumatriptan around twice a week on average for the migraines. Sleep is good. She denies any dizziness, diplopia, dysarthria/dysphagia, neck/back pain, bowel/bladder dysfunction. They have noticed she has a cough that would not stop if she has mint or a drink. She   Diagnostic Data: 03/20/2016 EEG normal wake and sleep EEG MRI brain 09/02/2015: Stable to slightly decreased size of rounded, well-circumscribed, homogenously  enhancing, extradural right cerebellar hemisphere lesion, again most consistent with a meningioma; this measures approximately 1.2 x 1.2 x 1.3 cm (AP x ML x CC) today. No new lesions suspicious for meningioma are seen. 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 She had meningioma resection in 2013. Otherwise she had a normal birth and early development.  There is no history of febrile convulsions, CNS infections such as meningitis/encephalitis, significant traumatic brain injury, or family history of seizures.   Prior AEDs: Tegretol, Dilantin, Keppra, Zonisamide  PAST MEDICAL HISTORY: Past Medical History:  Diagnosis Date   Anxiety    Brain tumor (HCC) 2013   Depression    Epilepsy (HCC)    GERD (gastroesophageal reflux disease)    Meningitis 2013   Migraines    Seizures (HCC)     MEDICATIONS: Current Outpatient Medications on File Prior to Visit  Medication Sig Dispense Refill   atorvastatin (LIPITOR) 20 MG tablet Take 1 tablet (20 mg total) by mouth daily. 90 tablet 3   azelaic acid (AZELEX) 20 % cream Apply topically 2 (two) times daily. After skin is thoroughly washed and patted dry, gently but thoroughly massage a thin film of azelaic acid cream into the affected area twice daily, in the morning and evening. 30 g 0   cyclobenzaprine (FLEXERIL) 10 MG tablet Take 1 tablet (10 mg total) by mouth 3 (three) times daily as needed for muscle spasms. 30 tablet 1   diclofenac Sodium (VOLTAREN) 1 % GEL Apply 4 g topically 4 (four) times daily. 150 g 0   gabapentin (NEURONTIN) 100 MG capsule Take 1 capsule every night 90 capsule 3   hydrOXYzine (ATARAX) 25 MG tablet Take  1 tablet (25 mg total) by mouth 3 (three) times daily as needed for anxiety. 60 tablet 5   ivabradine (CORLANOR) 5 MG TABS tablet Take tablets (15mg ) TWO hours prior to your cardiac CT scan. 3 tablet 0   ivabradine (CORLANOR) 7.5 MG TABS tablet Take tablets (15mg ) TWO hours prior to your cardiac CT scan. 2 tablet 0   metoprolol tartrate (LOPRESSOR) 100 MG tablet Take one tablet by mouth 2 hours prior to CT procedure. 1 tablet 0   predniSONE (DELTASONE) 10 MG tablet 3 tabs x3 days and then 2 tabs x3 days and then 1 tab x3 days.  Take w/ food. 18 tablet 0    sertraline (ZOLOFT) 50 MG tablet TAKE 3 TABLETS BY MOUTH AT BEDTIME 270 tablet 0   SUMAtriptan (IMITREX) 100 MG tablet TAKE 1 TABLET BY MOUTH AT FIRST SIGN OF MIGRAINE-MAY REPEAT IN 2 HRS-MAX 2 TABLES/24HOURS 9 tablet 0   topiramate (TOPAMAX) 50 MG tablet TAKE 3 TABLETS BY MOUTH IN AM AND 4 TABLETS AT BEDTIME 210 tablet 0   No current facility-administered medications on file prior to visit.    ALLERGIES: No Known Allergies  FAMILY HISTORY: Family History  Problem Relation Age of Onset   Hypertension Mother    Hypertension Father    Hyperlipidemia Father    Anxiety disorder Daughter    Other Daughter        vader syndrome   Hearing loss Son    Anxiety disorder Son    Diabetes Neg Hx    Cancer Neg Hx    Breast cancer Neg Hx     SOCIAL HISTORY: Social History   Socioeconomic History   Marital status: Married    Spouse name: Not on file   Number of children: Not on file   Years of education: Not on file   Highest education level: 9th grade  Occupational History   Occupation: food Control and instrumentation engineer: [employer]  Tobacco Use   Smoking status: Never   Smokeless tobacco: Never  Vaping Use   Vaping status: Never Used  Substance and Sexual Activity   Alcohol use: No   Drug use: Yes    Types: Barbituates   Sexual activity: Yes    Birth control/protection: None  Other Topics Concern   Not on file  Social History Narrative   Originally from [place], non smoker and non drinker, lives with husband and a son; 3 children (2 daughters and 1 son), 1 daughter passed at age 96       Lives in 1 story home   9th grade education   Works with [employer]   Right handed    5 steps one door and 3 steps on other   Social Drivers of Health   Financial Resource Strain: Low Risk  (04/18/2022)   Overall Financial Resource Strain (CARDIA)    Difficulty of Paying Living Expenses: Not very hard  Food Insecurity: No Food Insecurity (04/18/2022)   Hunger Vital Sign     Worried About Running Out of Food in the Last Year: Never true    Ran Out of Food in the Last Year: Never true  Transportation Needs: No Transportation Needs (04/18/2022)   PRAPARE - Administrator, Civil Service (Medical): No    Lack of Transportation (Non-Medical): No  Physical Activity: Insufficiently Active (04/18/2022)   Exercise Vital Sign    Days of Exercise per Week: 2 days    Minutes of Exercise per  Session: 10 min  Stress: No Stress Concern Present (04/18/2022)   [employer] of Occupational Health - Occupational Stress Questionnaire    Feeling of Stress : Only a little  Social Connections: Moderately Isolated (04/18/2022)   Social Connection and Isolation Panel [NHANES]    Frequency of Communication with Friends and Family: More than three times a week    Frequency of Social Gatherings with Friends and Family: Twice a week    Attends Religious Services: Never    Database administrator or Organizations: No    Attends Engineer, structural: Not on file    Marital Status: Married  Catering manager Violence: Not on file     PHYSICAL EXAM: Vitals:   03/12/23 0943  BP: 101/69  Pulse: 90  SpO2: 97%   General: No acute distress Head:  Normocephalic/atraumatic Skin/Extremities: No rash, no edema Neurological Exam: alert and awake. No aphasia or dysarthria. Fund of knowledge is appropriate.  Attention and concentration are normal.   Cranial nerves: Pupils equal, round. Extraocular movements intact with no nystagmus. Visual fields full.  No facial asymmetry.  Motor: Bulk and tone normal, muscle strength 5/5 throughout with no pronator drift.   Finger to nose testing intact.  Gait narrow-based and steady, able to tandem walk adequately.  Romberg negative.   IMPRESSION: This is a 57 yo RH woman with a history of migraines, meningioma resection, and seizures suggestive of focal seizures without impairment of awareness, possibly arising from the right hemisphere,  describing left-sided numbness and post-ictal weakness on the left side. However,a prior EEG reported left anterior temporal epileptiform discharges. Last brain MRI done 06/2021 no acute changes with volume loss in the left posterior parietal region after developing PRES in 2000, and unchanged meningioma in the right posterior fossa. She was in the ER on 12/12/22 with report of a seizure triggered by significant work stress. Migraines overall stable. Continue Topiramate 150mg  in AM, 200mg  in PM, Gabapentin 100mg  at bedtime, and prn sumatriptan for migraine rescue. She is aware of [place] driving laws to stop driving after a seizure until 6 months seizure-free. Follow-up in 6 months, call for any changes.    Thank you for allowing me to participate in her care.  Please do not hesitate to call for any questions or concerns.    Patrcia Dolly, M.D.   CC: Dr. Philip Aspen
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
## 2023-03-15 ENCOUNTER — Ambulatory Visit: Payer: 59 | Admitting: Psychology

## 2023-03-18 ENCOUNTER — Other Ambulatory Visit: Payer: Self-pay | Admitting: Family Medicine

## 2023-03-19 MED ORDER — SERTRALINE HCL 50 MG PO TABS: 150.0000 mg | ORAL_TABLET | Freq: Every day | ORAL | 0 refills | Status: DC

## 2023-03-29 ENCOUNTER — Ambulatory Visit: Payer: 59 | Admitting: Psychology

## 2023-03-29 DIAGNOSIS — F419 Anxiety disorder, unspecified: Secondary | ICD-10-CM | POA: Diagnosis not present

## 2023-03-29 DIAGNOSIS — F331 Major depressive disorder, recurrent, moderate: Secondary | ICD-10-CM | POA: Diagnosis not present
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
## 2023-04-10 ENCOUNTER — Ambulatory Visit (INDEPENDENT_AMBULATORY_CARE_PROVIDER_SITE_OTHER): Payer: 59 | Admitting: Internal Medicine

## 2023-04-10 ENCOUNTER — Encounter: Payer: Self-pay | Admitting: Internal Medicine

## 2023-04-10 VITALS — BP 110/70 | HR 76 | Temp 98.2°F | Wt 161.5 lb

## 2023-04-10 DIAGNOSIS — H9312 Tinnitus, left ear: Secondary | ICD-10-CM | POA: Diagnosis not present

## 2023-04-10 NOTE — Progress Notes (Signed)
Established Patient Office Visit     CC/Reason for Visit: Left tinnitus  HPI: Victoria Bishop is a 58 y.o. female who is coming in today for the above mentioned reasons.  Her tinnitus has been present for about 4 years.  She has seen ENT for this and workup has been normal other than bilateral hearing loss.  She is wondering if there is anything else that can be done.   Past Medical/Surgical History: Past Medical History:  Diagnosis Date   Anxiety    Brain tumor (HCC) 2013   Depression    Epilepsy (HCC)    GERD (gastroesophageal reflux disease)    Meningitis 2013   Migraines    Seizures (HCC)     Past Surgical History:  Procedure Laterality Date   BRAIN SURGERY  2014   CESAREAN SECTION     CT RADIATION THERAPY GUIDE  2014    Social History:  reports that she has never smoked. She has never used smokeless tobacco. She reports current drug use. Drug: Barbituates. She reports that she does not drink alcohol.  Allergies: No Known Allergies  Family History:  Family History  Problem Relation Age of Onset   Hypertension Mother    Hypertension Father    Hyperlipidemia Father    Anxiety disorder Daughter    Other Daughter        vader syndrome   Hearing loss Son    Anxiety disorder Son    Diabetes Neg Hx    Cancer Neg Hx    Breast cancer Neg Hx      Current Outpatient Medications:    atorvastatin (LIPITOR) 20 MG tablet, Take 1 tablet (20 mg total) by mouth daily., Disp: 90 tablet, Rfl: 3   azelaic acid (AZELEX) 20 % cream, Apply topically 2 (two) times daily. After skin is thoroughly washed and patted dry, gently but thoroughly massage a thin film of azelaic acid cream into the affected area twice daily, in the morning and evening., Disp: 30 g, Rfl: 0   cyclobenzaprine (FLEXERIL) 10 MG tablet, Take 1 tablet (10 mg total) by mouth 3 (three) times daily as needed for muscle spasms., Disp: 30 tablet, Rfl: 1   diclofenac Sodium (VOLTAREN) 1 % GEL, Apply 4 g  topically 4 (four) times daily., Disp: 150 g, Rfl: 0   gabapentin (NEURONTIN) 100 MG capsule, Take 1 capsule every night, Disp: 90 capsule, Rfl: 3   hydrOXYzine (ATARAX) 25 MG tablet, Take 1 tablet (25 mg total) by mouth 3 (three) times daily as needed for anxiety., Disp: 60 tablet, Rfl: 5   ivabradine (CORLANOR) 5 MG TABS tablet, Take tablets (15mg ) TWO hours prior to your cardiac CT scan., Disp: 3 tablet, Rfl: 0   ivabradine (CORLANOR) 7.5 MG TABS tablet, Take tablets (15mg ) TWO hours prior to your cardiac CT scan., Disp: 2 tablet, Rfl: 0   metoprolol tartrate (LOPRESSOR) 100 MG tablet, Take one tablet by mouth 2 hours prior to CT procedure., Disp: 1 tablet, Rfl: 0   predniSONE (DELTASONE) 10 MG tablet, 3 tabs x3 days and then 2 tabs x3 days and then 1 tab x3 days.  Take w/ food., Disp: 18 tablet, Rfl: 0   sertraline (ZOLOFT) 50 MG tablet, Take 3 tablets (150 mg total) by mouth at bedtime., Disp: 270 tablet, Rfl: 0   SUMAtriptan (IMITREX) 100 MG tablet, TAKE 1 TABLET BY MOUTH AT FIRST SIGN OF MIGRAINE-MAY REPEAT IN 2 HRS-MAX 2 TABLES/24HOURS, Disp: 9 tablet, Rfl: 11  topiramate (TOPAMAX) 50 MG tablet, TAKE 3 TABLETS BY MOUTH IN AM AND 4 TABLETS AT BEDTIME, Disp: 210 tablet, Rfl: 11  Review of Systems:  Negative unless indicated in HPI.   Physical Exam: Vitals:   04/10/23 1347  BP: 110/70  Pulse: 76  Temp: 98.2 F (36.8 C)  TempSrc: Oral  SpO2: 98%  Weight: 161 lb 8 oz (73.3 kg)    Body mass index is 29.54 kg/m.   Physical Exam HENT:     Right Ear: Decreased hearing noted.     Left Ear: Decreased hearing noted.  No middle ear effusion. There is no impacted cerumen. No foreign body.      Impression and Plan:  Left-sided tinnitus  -Advised patient not much we can do here.  She will try B complex vitamins.  She has already seen ENT.   Time spent:21 minutes reviewing chart, interviewing and examining patient and formulating plan of care.     Chaya Jan,  MD [facility]

## 2023-05-08 ENCOUNTER — Other Ambulatory Visit: Payer: Self-pay | Admitting: Physician Assistant

## 2023-05-31 ENCOUNTER — Telehealth: Payer: Self-pay | Admitting: Family Medicine

## 2023-05-31 NOTE — Telephone Encounter (Signed)
 Patient is scheduled for 3/26 for TOC/ CPE back to Dr. Mardelle Matte from [place].

## 2023-06-06 ENCOUNTER — Encounter: Payer: Self-pay | Admitting: Family Medicine

## 2023-06-06 ENCOUNTER — Ambulatory Visit (INDEPENDENT_AMBULATORY_CARE_PROVIDER_SITE_OTHER): Admitting: Family Medicine

## 2023-06-06 VITALS — BP 124/78 | HR 72 | Temp 98.2°F | Ht 62.0 in | Wt 158.0 lb

## 2023-06-06 DIAGNOSIS — E782 Mixed hyperlipidemia: Secondary | ICD-10-CM | POA: Diagnosis not present

## 2023-06-06 DIAGNOSIS — G43109 Migraine with aura, not intractable, without status migrainosus: Secondary | ICD-10-CM

## 2023-06-06 DIAGNOSIS — F339 Major depressive disorder, recurrent, unspecified: Secondary | ICD-10-CM | POA: Diagnosis not present

## 2023-06-06 DIAGNOSIS — G40109 Localization-related (focal) (partial) symptomatic epilepsy and epileptic syndromes with simple partial seizures, not intractable, without status epilepticus: Secondary | ICD-10-CM

## 2023-06-06 DIAGNOSIS — K2100 Gastro-esophageal reflux disease with esophagitis, without bleeding: Secondary | ICD-10-CM

## 2023-06-06 DIAGNOSIS — H903 Sensorineural hearing loss, bilateral: Secondary | ICD-10-CM | POA: Insufficient documentation

## 2023-06-06 DIAGNOSIS — D329 Benign neoplasm of meninges, unspecified: Secondary | ICD-10-CM | POA: Diagnosis not present

## 2023-06-06 DIAGNOSIS — R0789 Other chest pain: Secondary | ICD-10-CM | POA: Insufficient documentation

## 2023-06-06 DIAGNOSIS — Z1231 Encounter for screening mammogram for malignant neoplasm of breast: Secondary | ICD-10-CM

## 2023-06-06 DIAGNOSIS — H9312 Tinnitus, left ear: Secondary | ICD-10-CM

## 2023-06-06 LAB — LIPID PANEL
Cholesterol: 269 mg/dL — ABNORMAL HIGH (ref 0–200)
HDL: 47.2 mg/dL (ref >39.00)
LDL Cholesterol: 194 mg/dL — ABNORMAL HIGH (ref 0–99)
NonHDL: 222.08
Total CHOL/HDL Ratio: 6
Triglycerides: 141 mg/dL (ref 0.0–149.0)
VLDL: 28.2 mg/dL (ref 0.0–40.0)

## 2023-06-06 LAB — CBC WITH DIFFERENTIAL/PLATELET
Basophils Absolute: 0 10*3/uL (ref 0.0–0.1)
Basophils Relative: 0.6 % (ref 0.0–3.0)
Eosinophils Absolute: 0 10*3/uL (ref 0.0–0.7)
Eosinophils Relative: 1.2 % (ref 0.0–5.0)
HCT: 39.9 % (ref 36.0–46.0)
Hemoglobin: 13.4 g/dL (ref 12.0–15.0)
Lymphocytes Relative: 43.6 % (ref 12.0–46.0)
Lymphs Abs: 1.7 10*3/uL (ref 0.7–4.0)
MCHC: 33.5 g/dL (ref 30.0–36.0)
MCV: 91.3 fl (ref 78.0–100.0)
Monocytes Absolute: 0.2 10*3/uL (ref 0.1–1.0)
Monocytes Relative: 5.2 % (ref 3.0–12.0)
Neutro Abs: 1.9 10*3/uL (ref 1.4–7.7)
Neutrophils Relative %: 49.4 % (ref 43.0–77.0)
Platelets: 272 10*3/uL (ref 150.0–400.0)
RBC: 4.37 Mil/uL (ref 3.87–5.11)
RDW: 12.9 % (ref 11.5–15.5)
WBC: 3.9 10*3/uL — ABNORMAL LOW (ref 4.0–10.5)

## 2023-06-06 LAB — COMPREHENSIVE METABOLIC PANEL WITH GFR
ALT: 14 U/L (ref 0–35)
AST: 18 U/L (ref 0–37)
Albumin: 4.5 g/dL (ref 3.5–5.2)
Alkaline Phosphatase: 67 U/L (ref 39–117)
BUN: 13 mg/dL (ref 6–23)
CO2: 27 meq/L (ref 19–32)
Calcium: 9.6 mg/dL (ref 8.4–10.5)
Chloride: 106 meq/L (ref 96–112)
Creatinine, Ser: 0.71 mg/dL (ref 0.40–1.20)
GFR: 93.08 mL/min (ref >60.00)
Glucose, Bld: 89 mg/dL (ref 70–99)
Potassium: 3.8 meq/L (ref 3.5–5.1)
Sodium: 140 meq/L (ref 135–145)
Total Bilirubin: 0.4 mg/dL (ref 0.2–1.2)
Total Protein: 7.6 g/dL (ref 6.0–8.3)

## 2023-06-06 MED ORDER — TERBINAFINE HCL 250 MG PO TABS: 250.0000 mg | ORAL_TABLET | Freq: Every day | ORAL | 2 refills | Status: DC

## 2023-06-06 MED ORDER — SERTRALINE HCL 50 MG PO TABS: 150.0000 mg | ORAL_TABLET | Freq: Every day | ORAL | 3 refills | Status: AC

## 2023-06-06 NOTE — Patient Instructions (Addendum)
 Please return in 3 months for your annual complete physical. With pap smear  I will release your lab results to you on your MyChart account with further instructions. You may see the results before I do, but when I review them I will send you a message with my report or have my assistant call you if things need to be discussed. Please reply to my message with any questions. Thank you!   If you have any questions or concerns, please don't hesitate to send me a message via MyChart or call the office at [phone]. Thank you for visiting with [place] today! It's our pleasure caring for you.   VISIT SUMMARY:  Today, we focused on updating your overall health and managing your cholesterol. We also discussed your history of seizures, migraines, hearing loss, and a fungal infection in your toenail. Routine screenings such as a mammogram and Pap smear were also addressed.  YOUR PLAN:  -HYPERLIPIDEMIA: Hyperlipidemia means having high levels of cholesterol in your blood. We will recheck your cholesterol and liver tests today. If your cholesterol remains high, we may start you on a different cholesterol-lowering medication at a low dose, two to three times a week. Additionally, taking Coenzyme Q10 nightly may help reduce side effects.  -SEIZURE DISORDER: A seizure disorder involves episodes of abnormal electrical activity in the brain. Your seizures have improved with reduced stress and are managed by your neurologist.  -MIGRAINE: Migraines are severe headaches often accompanied by other symptoms like nausea and sensitivity to light. You experience migraines about once a month and are satisfied with your current treatment plan managed by your neurologist.  -ONYCHOMYCOSIS: Onychomycosis is a fungal infection of the toenail. We have prescribed an oral antifungal medication for up to three months to treat this condition.  -HEARING LOSS WITH TINNITUS: Hearing loss with tinnitus means you have reduced hearing  ability along with ringing in your ears. We discussed exploring more affordable hearing aid options available at [employer] or [address].  -GENERAL HEALTH MAINTENANCE: You are due for routine screenings including a mammogram and Pap smear. Your colonoscopy and immunizations are up to date. We will schedule your mammogram at [facility] and arrange for your Pap smear and physical examination.  INSTRUCTIONS:  Please schedule a follow-up visit for your physical examination and to review your blood work results. Please call the office checked below to schedule your appointment for your mammogram and/or bone density screen (the checked studies were ordered): [x]   Mammogram  []   Bone Density  [x]   [facility]     [address] [place], [place]        [phone]         []   [facility]  [address] [place], [place]  [phone]

## 2023-06-06 NOTE — Progress Notes (Signed)
 Subjective  CC:  Chief Complaint  Patient presents with   Transitions Of Care   Hyperlipidemia   Depression    HPI: Victoria Bishop is a 58 y.o. female who presents to the office today to address the problems listed above in the chief complaint. Discussed the use of AI scribe software for clinical note transcription with the patient, who gave verbal consent to proceed.  History of Present Illness   Victoria Bishop is a 58 year old female who presents for a transfer of care visit  She is due for routine screenings including a mammogram and Pap smear, but today's visit focuses on her cholesterol management and overall health update.  She has a history of seizures, which she associates with stress, particularly from her previous work environment and school pressures. Her seizures have improved since changing schools and reducing stress. The timing of her last seizure is unspecified, but she indicates improvement in her condition.  I reviewed recent notes from neurology.  No changes in medications at that time.  She has had a recent brain MRI.  She experiences migraines, which have decreased in frequency. Her last mild migraine occurred approximately two weeks ago. She experiences migraines about once a month, sometimes slightly more frequently. She is currently satisfied with her migraine medication regimen as managed by her neurologist.  Regarding her cholesterol management, she previously tried rosuvastatin but discontinued it due to pain in her fingers. She has not tried other cholesterol-lowering medications since then and is attempting to manage her cholesterol through dietary changes.  She experiences significant hearing loss, with 60% loss in one ear and 40% in the other, and has tinnitus. She does not use hearing aids due to cost.  I reviewed ENT notes.  She has a fungal infection in one toenail, which she attributes to a pedicure. She has not experienced any trauma to the  toe.     Assessment  1. Mixed hyperlipidemia   2. Meningioma (HCC)   3. Major depression, recurrent, chronic (HCC)   4. Migraine with aura and without status migrainosus, not intractable   5. Partial epilepsy (HCC)   6. Gastroesophageal reflux disease with esophagitis, unspecified whether hemorrhage   7. Left-sided tinnitus   8. Sensorineural hearing loss (SNHL) of both ears   9. Screening mammogram for breast cancer      Plan  Assessment and Plan    Hyperlipidemia Discontinued rosuvastatin due to finger pain. Cholesterol and liver tests to be rechecked today. Consider alternative medication if cholesterol remains elevated. - Recheck cholesterol and liver tests. - Consider starting a different cholesterol-lowering medication at a low dose, two to three times a week, if cholesterol remains elevated. - Recommend Coenzyme Q10 to decrease side effects.  Seizure disorder Seizures improved after reducing stress. Managed by neurologist.  Migraine Migraines occur monthly. Satisfied with current neurologist-managed regimen.  Onychomycosis Fungal infection in one toenail. Oral antifungal prescribed. - Prescribe oral antifungal medication for up to three months.  Hearing loss with tinnitus Significant hearing loss with tinnitus. No hearing aids due to cost. Suggested affordable options. - Suggest exploring more affordable hearing aid options at [employer].  General Health Maintenance Due for mammogram, Pap smear, and physical examination. Colonoscopy and immunizations up to date. - Schedule mammogram at [facility]. - Schedule Pap smear and physical examination.  Follow-up Follow-up needed for physical examination and review of blood work results. - Schedule follow-up visit for physical examination and  review of blood work results.     I spent a total of 54 minutes for this patient encounter. Time spent included preparation, face-to-face counseling with  the patient and coordination of care, review of chart and records, and documentation of the encounter.    Orders Placed This Encounter  Procedures   MM DIGITAL SCREENING BILATERAL   CBC with Differential/Platelet   Comprehensive metabolic panel   Lipid panel   TSH   Meds ordered this encounter  Medications   terbinafine (LAMISIL) 250 MG tablet    Sig: Take 1 tablet (250 mg total) by mouth daily.    Dispense:  30 tablet    Refill:  2   sertraline (ZOLOFT) 50 MG tablet    Sig: Take 3 tablets (150 mg total) by mouth at bedtime.    Dispense:  270 tablet    Refill:  3     I reviewed the patients updated PMH, FH, and SocHx.    Patient Active Problem List   Diagnosis Date Noted   Mixed hyperlipidemia 05/03/2020    Priority: High   Meningioma (HCC) 07/07/2016    Priority: High   Major depression, recurrent, chronic (HCC) 06/23/2016    Priority: High   Partial epilepsy (HCC) 10/01/2015    Priority: High   Migraine without status migrainosus, not intractable 10/01/2015    Priority: High   Chronic right-sided low back pain without sciatica 02/12/2017    Priority: Medium    Helicobacter pylori (H. pylori) infection 08/17/2015    Priority: Medium    Gastroesophageal reflux disease with esophagitis 08/13/2015    Priority: Medium    Atypical chest pain 06/06/2023    Priority: Low   Lipoma of torso 02/12/2017    Priority: Low   Exposure to communicable disease 12/01/2007    Priority: Low   Sensorineural hearing loss (SNHL) of both ears 06/06/2023   Current Meds  Medication Sig   azelaic acid (AZELEX) 20 % cream Apply topically 2 (two) times daily. After skin is thoroughly washed and patted dry, gently but thoroughly massage a thin film of azelaic acid cream into the affected area twice daily, in the morning and evening.   cyclobenzaprine (FLEXERIL) 10 MG tablet Take 1 tablet (10 mg total) by mouth 3 (three) times daily as needed for muscle spasms.   diclofenac Sodium  (VOLTAREN) 1 % GEL Apply 4 g topically 4 (four) times daily.   gabapentin (NEURONTIN) 100 MG capsule Take 1 capsule every night   hydrOXYzine (ATARAX) 25 MG tablet Take 1 tablet (25 mg total) by mouth 3 (three) times daily as needed for anxiety.   SUMAtriptan (IMITREX) 100 MG tablet TAKE 1 TABLET BY MOUTH AT FIRST SIGN OF MIGRAINE-MAY REPEAT IN 2 HRS-MAX 2 TABLES/24HOURS   terbinafine (LAMISIL) 250 MG tablet Take 1 tablet (250 mg total) by mouth daily.   topiramate (TOPAMAX) 50 MG tablet TAKE 3 TABLETS BY MOUTH IN AM AND 4 TABLETS AT BEDTIME   [DISCONTINUED] atorvastatin (LIPITOR) 20 MG tablet Take 1 tablet (20 mg total) by mouth daily.   [DISCONTINUED] ivabradine (CORLANOR) 5 MG TABS tablet Take tablets (15mg ) TWO hours prior to your cardiac CT scan.   [DISCONTINUED] ivabradine (CORLANOR) 7.5 MG TABS tablet Take tablets (15mg ) TWO hours prior to your cardiac CT scan.   [DISCONTINUED] metoprolol tartrate (LOPRESSOR) 100 MG tablet Take one tablet by mouth 2 hours prior to CT procedure.   [DISCONTINUED] predniSONE (DELTASONE) 10 MG tablet 3 tabs x3 days and then 2 tabs  x3 days and then 1 tab x3 days.  Take w/ food.   [DISCONTINUED] sertraline (ZOLOFT) 50 MG tablet Take 3 tablets (150 mg total) by mouth at bedtime.    Allergies: Patient has no known allergies. Family History: Patient family history includes Anxiety disorder in her daughter and son; Hearing loss in her son; Hyperlipidemia in her father; Hypertension in her father and mother; Other in her daughter. Social History:  Patient  reports that she has never smoked. She has never used smokeless tobacco. She reports current drug use. Drug: Barbituates. She reports that she does not drink alcohol.  Review of Systems: Constitutional: Negative for fever malaise or anorexia Cardiovascular: negative for chest pain Respiratory: negative for SOB or persistent cough Gastrointestinal: negative for abdominal pain  Objective  Vitals: BP 124/78    Pulse 72   Temp 98.2 F (36.8 C)   Ht 5\' 2"  (1.575 m)   Wt 158 lb (71.7 kg)   LMP 07/12/2014 (LMP Unknown)   SpO2 98%   BMI 28.90 kg/m  General: no acute distress , A&Ox3 HEENT: PEERL, conjunctiva normal, neck is supple Cardiovascular:  RRR without murmur or gallop.  Respiratory:  Good breath sounds bilaterally, CTAB with normal respiratory effort Skin:  Warm, no rashes Right great toenail with fungal changes.  Commons side effects, risks, benefits, and alternatives for medications and treatment plan prescribed today were discussed, and the patient expressed understanding of the given instructions. Patient is instructed to call or message via MyChart if he/she has any questions or concerns regarding our treatment plan. No barriers to understanding were identified. We discussed Red Flag symptoms and signs in detail. Patient expressed understanding regarding what to do in case of urgent or emergency type symptoms.  Medication list was reconciled, printed and provided to the patient in AVS. Patient instructions and summary information was reviewed with the patient as documented in the AVS. This note was prepared with assistance of Dragon voice recognition software. Occasional wrong-word or sound-a-like substitutions may have occurred due to the inherent limitations of voice recognition software

## 2023-06-07 LAB — TSH: TSH: 1.16 u[IU]/mL (ref 0.35–5.50)

## 2023-06-11 NOTE — Progress Notes (Signed)
 Please call patient:cholesterol is very high again; please order lipitor 10: start twice a week to see if she can tolerate it along with daily coenzyme Q 10 (OTC).

## 2023-06-12 ENCOUNTER — Other Ambulatory Visit: Payer: Self-pay

## 2023-06-12 DIAGNOSIS — E782 Mixed hyperlipidemia: Secondary | ICD-10-CM

## 2023-06-12 MED ORDER — ATORVASTATIN CALCIUM 10 MG PO TABS
10.0000 mg | ORAL_TABLET | Freq: Every day | ORAL | 1 refills | Status: AC
Start: 2023-06-12 — End: ?

## 2023-06-14 ENCOUNTER — Telehealth: Payer: Self-pay

## 2023-06-14 NOTE — Telephone Encounter (Signed)
 Copied from CRM [id number]. Topic: Clinical - Lab/Test Results >> Jun 12, 2023  1:41 PM Jon Gills C wrote: Reason for CRM: Patient called in wanted to know about her kidneys and the results to that  Would like for someone to give her a callback regarding this  Spoke with pt regarding lab results/recommentations

## 2023-06-26 ENCOUNTER — Ambulatory Visit
Admission: RE | Admit: 2023-06-26 | Discharge: 2023-06-26 | Disposition: A | Discharge status: Home / Self Care | Source: Ambulatory Visit | Attending: Family Medicine | Admitting: Family Medicine

## 2023-06-26 DIAGNOSIS — Z1231 Encounter for screening mammogram for malignant neoplasm of breast: Secondary | ICD-10-CM

## 2023-09-25 ENCOUNTER — Ambulatory Visit: Admitting: Family Medicine

## 2023-09-25 ENCOUNTER — Encounter: Payer: Self-pay | Admitting: Family Medicine

## 2023-09-25 VITALS — BP 114/73 | HR 63 | Temp 98.0°F | Ht 62.0 in | Wt 159.0 lb

## 2023-09-25 DIAGNOSIS — H903 Sensorineural hearing loss, bilateral: Secondary | ICD-10-CM | POA: Diagnosis not present

## 2023-09-25 DIAGNOSIS — H66002 Acute suppurative otitis media without spontaneous rupture of ear drum, left ear: Secondary | ICD-10-CM | POA: Diagnosis not present

## 2023-09-25 DIAGNOSIS — S0921XA Traumatic rupture of right ear drum, initial encounter: Secondary | ICD-10-CM | POA: Diagnosis not present

## 2023-09-25 MED ORDER — AMOXICILLIN-POT CLAVULANATE 875-125 MG PO TABS: 1.0000 | ORAL_TABLET | Freq: Two times a day (BID) | ORAL | 0 refills | Status: DC

## 2023-09-25 MED ORDER — FLUTICASONE PROPIONATE 50 MCG/ACT NA SUSP: 1.0000 | Freq: Every day | NASAL | 6 refills | Status: AC

## 2023-09-25 NOTE — Patient Instructions (Signed)
 Please schedule your complete physical and pap smear.   If you have any questions or concerns, please don't hesitate to send me a message via MyChart or call the office at [phone]. Thank you for visiting with us  today! It's our pleasure caring for you.

## 2023-09-25 NOTE — Progress Notes (Signed)
 Subjective  CC:  Chief Complaint  Patient presents with   hearing issues    Pt stated that she has some hearing issues in both ears. 60% in left ear and 40% in right     HPI: Victoria Bishop is a 58 y.o. female who presents to the office today to address the problems listed above in the chief complaint. Discussed the use of AI scribe software for clinical note transcription with the patient, who gave verbal consent to proceed.  History of Present Illness Victoria Bishop is a 58 year old female who presents with hearing loss and ear pain after using a Q-tip.  She accidentally pushed a Q-tip too far while cleaning her ear on Friday, resulting in worsened hearing in the affected ear and some pain. There was minor bleeding at the time of the incident, and now there is a scab present.  She has a history of hearing loss, with approximately 60% hearing loss in one ear and 40% in the other. Since the incident, she is unable to hear anything in the affected ear. She has pain but no drainage. No fevers.    Assessment  1. Non-recurrent acute suppurative otitis media of left ear without spontaneous rupture of tympanic membrane   2. Traumatic rupture of right ear drum, initial encounter      Plan  Assessment and Plan Assessment & Plan Ear Infection Ear infection secondary to trauma from Q-tip use, causing scab formation and impaired hearing. Educated on Q-tip risks. - Prescribed antibiotics. Augmentin  875 bid x 7 d - Advised against Q-tip use. - Prescribed nasal spray. flonase  - Recommended aspirin.  Hearing Loss 60% hearing loss in one ear, 40% in the other. Infection exacerbated loss. Discussed hearing aid and sign language if no improvement. - Recommend hearing aid. - Suggest learning sign language if no improvement.  General Health Maintenance Due for physical examination and Pap smear. - Schedule physical examination and Pap smear.    Follow up: cpe w/ pap No orders of  the defined types were placed in this encounter.  Meds ordered this encounter  Medications   fluticasone  (FLONASE ) 50 MCG/ACT nasal spray    Sig: Place 1 spray into both nostrils daily.    Dispense:  16 g    Refill:  6   amoxicillin -clavulanate (AUGMENTIN ) 875-125 MG tablet    Sig: Take 1 tablet by mouth 2 (two) times daily.    Dispense:  14 tablet    Refill:  0     I reviewed the patients updated PMH, FH, and SocHx.  Patient Active Problem List   Diagnosis Date Noted   Mixed hyperlipidemia 05/03/2020    Priority: High   Meningioma (HCC) 07/07/2016    Priority: High   Major depression, recurrent, chronic (HCC) 06/23/2016    Priority: High   Partial epilepsy (HCC) 10/01/2015    Priority: High   Migraine without status migrainosus, not intractable 10/01/2015    Priority: High   Chronic right-sided low back pain without sciatica 02/12/2017    Priority: Medium    Helicobacter pylori (H. pylori) infection 08/17/2015    Priority: Medium    Gastroesophageal reflux disease with esophagitis 08/13/2015    Priority: Medium    Atypical chest pain 06/06/2023    Priority: Low   Lipoma of torso 02/12/2017    Priority: Low   Exposure to communicable disease 12/01/2007    Priority: Low   Sensorineural hearing loss (SNHL) of both ears 06/06/2023  Current Meds  Medication Sig   amoxicillin -clavulanate (AUGMENTIN ) 875-125 MG tablet Take 1 tablet by mouth 2 (two) times daily.   atorvastatin  (LIPITOR) 10 MG tablet Take 1 tablet (10 mg total) by mouth daily.   azelaic acid  (AZELEX ) 20 % cream Apply topically 2 (two) times daily. After skin is thoroughly washed and patted dry, gently but thoroughly massage a thin film of azelaic acid  cream into the affected area twice daily, in the morning and evening.   cyclobenzaprine  (FLEXERIL ) 10 MG tablet Take 1 tablet (10 mg total) by mouth 3 (three) times daily as needed for muscle spasms.   diclofenac  Sodium (VOLTAREN ) 1 % GEL Apply 4 g topically 4  (four) times daily.   fluticasone  (FLONASE ) 50 MCG/ACT nasal spray Place 1 spray into both nostrils daily.   gabapentin  (NEURONTIN ) 100 MG capsule Take 1 capsule every night   hydrOXYzine  (ATARAX ) 25 MG tablet Take 1 tablet (25 mg total) by mouth 3 (three) times daily as needed for anxiety.   sertraline  (ZOLOFT ) 50 MG tablet Take 3 tablets (150 mg total) by mouth at bedtime.   SUMAtriptan  (IMITREX ) 100 MG tablet TAKE 1 TABLET BY MOUTH AT FIRST SIGN OF MIGRAINE-MAY REPEAT IN 2 HRS-MAX 2 TABLES/24HOURS   terbinafine  (LAMISIL ) 250 MG tablet Take 1 tablet (250 mg total) by mouth daily.   topiramate  (TOPAMAX ) 50 MG tablet TAKE 3 TABLETS BY MOUTH IN AM AND 4 TABLETS AT BEDTIME   Allergies: Patient has no known allergies. Family History: Patient family history includes Anxiety disorder in her daughter and son; Hearing loss in her son; Hyperlipidemia in her father; Hypertension in her father and mother; Other in her daughter. Social History:  Patient  reports that she has never smoked. She has never used smokeless tobacco. She reports current drug use. Drug: Barbituates. She reports that she does not drink alcohol.  Review of Systems: Constitutional: Negative for fever malaise or anorexia Cardiovascular: negative for chest pain Respiratory: negative for SOB or persistent cough Gastrointestinal: negative for abdominal pain  Objective  Vitals: BP 114/73   Pulse 63   Temp 98 F (36.7 C)   Ht 5' 2 (1.575 m)   Wt 159 lb (72.1 kg)   LMP 07/12/2014 (LMP Unknown)   SpO2 96%   BMI 29.08 kg/m  General: no acute distress , A&Ox3, HOH HEENT: PEERL, conjunctiva normal, neck is supple Right TM: large scab centrally w/ effusion. Left TM nl  Hearing Screening   125Hz  250Hz  500Hz  1000Hz  2000Hz  3000Hz  4000Hz  5000Hz  6000Hz  8000Hz   Right ear Fail Fail Fail Fail Fail Fail Fail Fail Fail Fail  Left ear Fail Fail Fail Fail Fail Fail Fail Fail Fail Fail     Commons side effects, risks, benefits, and  alternatives for medications and treatment plan prescribed today were discussed, and the patient expressed understanding of the given instructions. Patient is instructed to call or message via MyChart if he/she has any questions or concerns regarding our treatment plan. No barriers to understanding were identified. We discussed Red Flag symptoms and signs in detail. Patient expressed understanding regarding what to do in case of urgent or emergency type symptoms.  Medication list was reconciled, printed and provided to the patient in AVS. Patient instructions and summary information was reviewed with the patient as documented in the AVS. This note was prepared with assistance of Dragon voice recognition software. Occasional wrong-word or sound-a-like substitutions may have occurred due to the inherent limitations of voice recognition software

## 2023-10-05 ENCOUNTER — Encounter: Payer: Self-pay | Admitting: Neurology

## 2023-10-05 ENCOUNTER — Ambulatory Visit (INDEPENDENT_AMBULATORY_CARE_PROVIDER_SITE_OTHER): Payer: 59 | Admitting: Neurology

## 2023-10-05 VITALS — BP 99/62 | HR 80 | Ht 62.0 in | Wt 161.0 lb

## 2023-10-05 DIAGNOSIS — G43009 Migraine without aura, not intractable, without status migrainosus: Secondary | ICD-10-CM | POA: Diagnosis not present

## 2023-10-05 DIAGNOSIS — D329 Benign neoplasm of meninges, unspecified: Secondary | ICD-10-CM | POA: Diagnosis not present

## 2023-10-05 DIAGNOSIS — G40209 Localization-related (focal) (partial) symptomatic epilepsy and epileptic syndromes with complex partial seizures, not intractable, without status epilepticus: Secondary | ICD-10-CM

## 2023-10-05 MED ORDER — TOPIRAMATE 100 MG PO TABS: ORAL_TABLET | ORAL | 3 refills | Status: DC

## 2023-10-05 MED ORDER — SUMATRIPTAN SUCCINATE 100 MG PO TABS: ORAL_TABLET | ORAL | 11 refills | Status: DC

## 2023-10-05 MED ORDER — GABAPENTIN 100 MG PO CAPS: ORAL_CAPSULE | ORAL | 3 refills | Status: DC

## 2023-10-05 NOTE — Patient Instructions (Signed)
 Good to see you.  Schedule MRI brain with and without contrast  2. Schedule 1-hour EEG  3. We will change the Topiramate  to 100 tablet: take 1 and 1/2 tablets every morning and 2 tablets every evening  4. Continue Gabapentin  100mg  every evening  5. Continue working on taking your medications regularly to help cut down on migraines and seizures  6. Follow-up in 4-5 months, call for any changes   Seizure Precautions: 1. If medication has been prescribed for you to prevent seizures, take it exactly as directed.  Do not stop taking the medicine without talking to your doctor first, even if you have not had a seizure in a long time.   2. Avoid activities in which a seizure would cause danger to yourself or to others.  Don't operate dangerous machinery, swim alone, or climb in high or dangerous places, such as on ladders, roofs, or girders.  Do not drive unless your doctor says you may.  3. If you have any warning that you may have a seizure, lay down in a safe place where you can't hurt yourself.    4.  No driving for 6 months from last seizure, as per [place]  state law.   Please refer to the following link on the [employer]'s website for more information: http://www.epilepsyfoundation.org/answerplace/Social/driving/drivingu.cfm   5.  Maintain good sleep hygiene. Avoid alcohol  6.  Contact your doctor if you have any problems that may be related to the medicine you are taking.  7.  Call 911 and bring the patient back to the ED if:        A.  The seizure lasts longer than 5 minutes.       B.  The patient doesn't awaken shortly after the seizure  C.  The patient has new problems such as difficulty seeing, speaking or moving  D.  The patient was injured during the seizure  E.  The patient has a temperature over 102 F (39C)  F.  The patient vomited and now is having trouble breathing

## 2023-10-05 NOTE — Progress Notes (Signed)
 NEUROLOGY FOLLOW UP OFFICE NOTE  Victoria Bishop [id number] 1965/12/25  HISTORY OF PRESENT ILLNESS: I had the pleasure of seeing Victoria Bishop in follow-up in the neurology clinic on 10/05/2023.  The patient was last seen 7 months ago for seizures and migraines. She is alone in the office today. Records and images were personally reviewed where available.  Since her last visit, she denies any bigger seizures since 12/2022 however she reports times where she wakes up tired like she had a seizure. She states she had a very bad dream, no tongue bite or incontinence. It does not happen often, last episode was last month. She feels like she is zoned out sometimes when she is tired. Sometimes her left arm and leg feel tired and weak, last episode was last month. No associated jerking. She reports migraines are just regular, she takes Sumatriptan  with Ibuprofen. Recently migraine lasted 3 days. Sometimes she wakes up with a migraine. She sleeps okay. Sometimes she thinks her glucose levels are causing her symptoms. She is taking Topiramate  150mg  in AM, 200mg  in PM (50mg : 3 tabs in AM, 4 tabs in PM) and Gabapentin  100mg  at bedtime without side effects. She reports sometimes missing medications because she feels she is taking too much. She lives with her husband.    History on Initial Assessment 05/11/2017: This is a 58 yo RH woman presenting for migraines, meningioma resection, and seizures. She recently moved from [place] , records from her Neurologist in [place], [place] were reviewed. She started having seizures in July 2000 while still living in [place]. At that time she had eclampsia during delivery, she had pain radiating to her head then lost awareness. She was admitted to the hospital for a month and had renal failure requiring dialysis. Per report, 3 days later, she had seizures. There is an MRI brain report from July 2000 was concerning for PRES, reporting abnormal foci of signal intensity involving the  cortical and subcortical regions of the posterior parietal and occipital lobes in a fairly symmetrical fashion. The largest area of abnormal signal involves the left posterior parietal lobe. Initially she was having episodes of loss of consciousness with convulsions. She was initially started on Tegretol and Dilantin immediately post-partum, and had been very well controlled on carbamazepine XR for several years. There is an EEG report from 11/2005 quoted from [facility] reporting epileptiform activity generalized, although there was not significant associated clinical motor activity. She had an EEG in [place]  in 04/2006 reporting occasional sharp and slow wave complexes that appear epileptiform over the left anterior temporal regions. They deny any convulsions since 2000. Since then, she has been having recurrent stereotyped episodes starting with left-sided numbness and tingling on the arm, leg, face, and left side of her tongue. The left side of her head feels like it is going weak. She describes electric shocks. There is no jerking or twitching but she describes them as spasms, then she feels weaker on the left side after. She cannot concentrate when this occurs, she speaks but slows down a lot, keeps saying uhm for several minutes. Apparently she can still function and do things when this occurs. She is sleepy afterwards and wakes up feeling better, but her daughter notices it takes a couple of days to return to her normal baseline. There is no associated headaches. She has difficulty saying how often they are, sometimes occurring twice a week, other times every other week. Last episode was 1.5 weeks ago. When she has them at night, she  has really bad dreams. Her husband thinks she is having nightmares but when she wakes up she feels different. She denies any olfactory/gustatory hallucinations, rising epigastric sensation. She has noticed some gaps in time, her daughter has to repeat things some times.     Records from [place]  were reviewed. She was transitioned off carbamazepine to zonisamide in 2010 due to concerns for osteoporosis, however had abdominal pain and stomach burning, and was switched to Levetiracetam. 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 She reports that headaches worsened after the surgery and was started on Topiramate  in 2015. She feels this has helped with the headaches but not the seizures. She is taking Topiramate  50mg  BID. Higher doses in the past caused side effects. She reports stopping the Keppra last February 2018 because her mood swings were horrible and anxiety was heightened. This has increased seizures frequency, but the anxiety and mood swings are better. Her Zoloft  dose was also increased. She takes sumatriptan  around twice a week on average for the migraines. Sleep is good. She denies any dizziness, diplopia, dysarthria/dysphagia, neck/back pain, bowel/bladder dysfunction. They have noticed she has a cough that would not stop if she has mint or a drink. She   Diagnostic Data:  EEG in [place]  in 04/2006 reporting occasional sharp and slow wave complexes that appear epileptiform over the left anterior temporal regions.  03/20/2016 EEG normal wake and sleep EEG  MRI brain 09/02/2015: Stable to slightly decreased size of rounded, well-circumscribed, homogenously  enhancing, extradural right cerebellar hemisphere lesion, again most consistent with a meningioma; this measures approximately 1.2 x 1.2 x 1.3 cm (AP x ML x CC) today. No new lesions suspicious for meningioma are seen. 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  MRI brain  without contrast  06/2021 no acute changes, there is a small focus of encephalomalacia in the left parietal lobe. The extra-axial lesion in the right posterior fossa is unchanged compared to 2021 (1.3 x 1.1 cm) with mild mass effect on the underlying brain parenchyma  Epilepsy Risk Factors:  PRES in 2000, left parietal encephalomalacia. She had meningioma resection in 2013. Otherwise she had a normal birth and early development.  There is no history of febrile convulsions, CNS infections such as meningitis/encephalitis, significant traumatic brain injury, or family history of seizures.   Prior AEDs: Tegretol, Dilantin, Keppra, Zonisamide  PAST MEDICAL HISTORY: Past Medical History:  Diagnosis Date   Anxiety    Brain tumor (HCC) 2013   Depression    Epilepsy (HCC)    GERD (gastroesophageal reflux disease)    Meningitis 2013   Migraines    Seizures (HCC)     MEDICATIONS: Current Outpatient Medications on File Prior to Visit  Medication Sig Dispense Refill   amoxicillin -clavulanate (AUGMENTIN ) 875-125 MG tablet Take 1 tablet by mouth 2 (two) times daily. 14 tablet 0   cyclobenzaprine  (FLEXERIL ) 10 MG tablet Take 1 tablet (10 mg total) by mouth 3 (three) times daily as needed for muscle spasms. 30 tablet 1   diclofenac  Sodium (VOLTAREN ) 1 % GEL Apply 4 g topically 4 (four) times daily. 150 g 0   fluticasone  (FLONASE ) 50 MCG/ACT nasal spray Place 1 spray into both nostrils daily. 16 g 6  gabapentin  (NEURONTIN ) 100 MG capsule Take 1 capsule every night 90 capsule 3   hydrOXYzine  (ATARAX ) 25 MG tablet Take 1 tablet (25 mg total) by mouth 3 (three) times daily as needed for anxiety. 60 tablet 5   sertraline  (ZOLOFT ) 50 MG tablet Take 3 tablets (150 mg total) by mouth at bedtime. 270 tablet 3   SUMAtriptan  (IMITREX ) 100 MG tablet TAKE 1 TABLET BY MOUTH AT FIRST SIGN OF MIGRAINE-MAY REPEAT IN 2 HRS-MAX 2 TABLES/24HOURS 9 tablet 11   topiramate  (TOPAMAX ) 50 MG tablet TAKE 3 TABLETS BY MOUTH IN AM AND 4 TABLETS AT  BEDTIME 210 tablet 11   atorvastatin  (LIPITOR) 10 MG tablet Take 1 tablet (10 mg total) by mouth daily. (Patient not taking: Reported on 10/05/2023) 90 tablet 1   azelaic acid  (AZELEX ) 20 % cream Apply topically 2 (two) times daily. After skin is thoroughly washed and patted dry, gently but thoroughly massage a thin film of azelaic acid  cream into the affected area twice daily, in the morning and evening. 30 g 0   terbinafine  (LAMISIL ) 250 MG tablet Take 1 tablet (250 mg total) by mouth daily. 30 tablet 2   No current facility-administered medications on file prior to visit.    ALLERGIES: No Known Allergies  FAMILY HISTORY: Family History  Problem Relation Age of Onset   Hypertension Mother    Hypertension Father    Hyperlipidemia Father    Anxiety disorder Daughter    Other Daughter        vader syndrome   Hearing loss Son    Anxiety disorder Son    Diabetes Neg Hx    Cancer Neg Hx    Breast cancer Neg Hx     SOCIAL HISTORY: Social History   Socioeconomic History   Marital status: Married    Spouse name: Not on file   Number of children: Not on file   Years of education: Not on file   Highest education level: 9th grade  Occupational History   Occupation: food Control and instrumentation engineer: [employer]  Tobacco Use   Smoking status: Never   Smokeless tobacco: Never  Vaping Use   Vaping status: Never Used  Substance and Sexual Activity   Alcohol use: No   Drug use: Yes    Types: Barbituates   Sexual activity: Yes    Birth control/protection: None  Other Topics Concern   Not on file  Social History Narrative   Originally from [place], non smoker and non drinker, lives with husband and a son; 3 children (2 daughters and 1 son), 1 daughter passed at age 98       Lives in 1 story home   9th grade education   Works with [employer]   Right handed    5 steps one door and 3 steps on other   Social Drivers of Health   Financial Resource Strain: Low Risk   (04/18/2022)   Overall Financial Resource Strain (CARDIA)    Difficulty of Paying Living Expenses: Not very hard  Food Insecurity: No Food Insecurity (04/18/2022)   Hunger Vital Sign    Worried About Running Out of Food in the Last Year: Never true    Ran Out of Food in the Last Year: Never true  Transportation Needs: No Transportation Needs (04/18/2022)   PRAPARE - Administrator, Civil Service (Medical): No    Lack of Transportation (Non-Medical): No  Physical Activity: Insufficiently Active (04/18/2022)   Exercise  Vital Sign    Days of Exercise per Week: 2 days    Minutes of Exercise per Session: 10 min  Stress: No Stress Concern Present (04/18/2022)   [employer] of Occupational Health - Occupational Stress Questionnaire    Feeling of Stress : Only a little  Social Connections: Moderately Isolated (04/18/2022)   Social Connection and Isolation Panel    Frequency of Communication with Friends and Family: More than three times a week    Frequency of Social Gatherings with Friends and Family: Twice a week    Attends Religious Services: Never    Database administrator or Organizations: No    Attends Engineer, structural: Not on file    Marital Status: Married  Catering manager Violence: Not on file     PHYSICAL EXAM: Vitals:   10/05/23 0943  BP: 99/62  Pulse: 80  SpO2: 96%   General: No acute distress Head:  Normocephalic/atraumatic Skin/Extremities: No rash, no edema Neurological Exam: alert and awake. No aphasia or dysarthria. Fund of knowledge is appropriate.  Attention and concentration are normal.   Cranial nerves: Pupils equal, round. Extraocular movements intact with no nystagmus. Visual fields full.  No facial asymmetry.  Motor: Bulk and tone normal, muscle strength 5/5 throughout with no pronator drift.   Finger to nose testing intact.  Gait narrow-based and steady, able to tandem walk adequately.  Romberg negative.   IMPRESSION: This is a 58 yo RH  woman with a history of migraines, meningioma resection, and seizures suggestive of focal seizures without impairment of awareness, possibly arising from the right hemisphere, describing left-sided numbness and post-ictal weakness on the left side. However,a prior EEG reported left anterior temporal epileptiform discharges. Last brain MRI done 06/2021 no acute changes with volume loss in the left posterior parietal region after developing PRES in 2000, and unchanged meningioma in the right posterior fossa. She denies any bigger seizures since 12/2022 but continues to report episodes of left-sided weakness. Follow-up brain MRI with and without contrast will be ordered to assess for underlying structural abnormality. Schedule 1-hour EEG. Continue current medications, we will switch to Topiramate  100mg  tablets: take 1.5 tabs in AM, 2 tabs in PM to reduce pill load. Continue Gabapentin  100mg  at bedtime and prn sumatriptan . We discussed the importance of medication compliance. She is aware of [place] driving laws to stop driving after a seizure until 6 months seizure-free. Follow-up in 4-5 months, call for any changes.    Thank you for allowing me to participate in her care.  Please do not hesitate to call for any questions or concerns.    Darice Shivers, M.D.   CC: Dr. Jodie

## 2023-10-08 ENCOUNTER — Ambulatory Visit (INDEPENDENT_AMBULATORY_CARE_PROVIDER_SITE_OTHER): Admitting: Neurology

## 2023-10-08 DIAGNOSIS — G43009 Migraine without aura, not intractable, without status migrainosus: Secondary | ICD-10-CM | POA: Diagnosis not present

## 2023-10-08 DIAGNOSIS — G40209 Localization-related (focal) (partial) symptomatic epilepsy and epileptic syndromes with complex partial seizures, not intractable, without status epilepticus: Secondary | ICD-10-CM | POA: Diagnosis not present

## 2023-10-08 NOTE — Progress Notes (Unsigned)
 EEG complete and ready for review.

## 2023-10-10 NOTE — Procedures (Signed)
 ELECTROENCEPHALOGRAM REPORT  Date of Study: 10/08/2023  Patient's Name: Victoria Bishop MRN: [id number] Date of Birth: 1966/01/30  Referring Provider: Dr. 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 EEG for classification.  CNS Active Medications: Topiramate , Gabapentin , Sertraline , Hydroxyzine   Technical Summary: A multichannel digital 1-hour EEG recording measured by the international 10-20 system with electrodes applied with paste and impedances below 5000 ohms performed in our laboratory with EKG monitoring in an awake and asleep patient.  Hyperventilation and photic stimulation were performed.  The digital EEG was referentially recorded, reformatted, and digitally filtered in a variety of bipolar and referential montages for optimal display.    Description: The patient is awake and asleep during the recording.  During maximal wakefulness, there is a symmetric, medium voltage 10-10.5 Hz posterior dominant rhythm that attenuates with eye opening.  There is occasional focal 4-5 Hz theta slowing over the left temporal region, at times sharply contoured without clear epileptogenic potential. During drowsiness and sleep, there is an increase in theta slowing of the background.  Vertex waves and symmetric sleep spindles were seen. Hyperventilation and photic stimulation did not elicit any abnormalities.  There were no epileptiform discharges or electrographic seizures seen.    EKG lead was unremarkable.  Impression: This 1-hour awake and asleep EEG is abnormal due to occasional focal slowing over the left temporal region.  Clinical Correlation of the above findings indicates focal cerebral dysfunction over the left temporal region suggestive of underlying structural or physiologic  abnormality. The absence of epileptiform discharges does not exclude a clinical diagnosis of epilepsy. Clinical correlation is advised.   Darice Shivers, M.D.

## 2023-10-11 ENCOUNTER — Ambulatory Visit (INDEPENDENT_AMBULATORY_CARE_PROVIDER_SITE_OTHER): Admitting: Family Medicine

## 2023-10-11 ENCOUNTER — Other Ambulatory Visit (HOSPITAL_COMMUNITY)
Admission: RE | Admit: 2023-10-11 | Discharge: 2023-10-11 | Disposition: A | Discharge status: Home / Self Care | Source: Ambulatory Visit | Attending: Family Medicine | Admitting: Family Medicine

## 2023-10-11 ENCOUNTER — Encounter: Payer: Self-pay | Admitting: Family Medicine

## 2023-10-11 VITALS — BP 100/70 | HR 65 | Temp 98.2°F | Ht 62.0 in | Wt 160.0 lb

## 2023-10-11 DIAGNOSIS — Z0001 Encounter for general adult medical examination with abnormal findings: Secondary | ICD-10-CM

## 2023-10-11 DIAGNOSIS — G40109 Localization-related (focal) (partial) symptomatic epilepsy and epileptic syndromes with simple partial seizures, not intractable, without status epilepticus: Secondary | ICD-10-CM

## 2023-10-11 DIAGNOSIS — Z124 Encounter for screening for malignant neoplasm of cervix: Secondary | ICD-10-CM | POA: Diagnosis not present

## 2023-10-11 DIAGNOSIS — M545 Low back pain, unspecified: Secondary | ICD-10-CM

## 2023-10-11 DIAGNOSIS — G8929 Other chronic pain: Secondary | ICD-10-CM

## 2023-10-11 DIAGNOSIS — D329 Benign neoplasm of meninges, unspecified: Secondary | ICD-10-CM | POA: Diagnosis not present

## 2023-10-11 DIAGNOSIS — E782 Mixed hyperlipidemia: Secondary | ICD-10-CM

## 2023-10-11 DIAGNOSIS — F339 Major depressive disorder, recurrent, unspecified: Secondary | ICD-10-CM

## 2023-10-11 DIAGNOSIS — G43109 Migraine with aura, not intractable, without status migrainosus: Secondary | ICD-10-CM

## 2023-10-11 LAB — COMPREHENSIVE METABOLIC PANEL WITH GFR
ALT: 15 U/L (ref 0–35)
AST: 19 U/L (ref 0–37)
Albumin: 4.4 g/dL (ref 3.5–5.2)
Alkaline Phosphatase: 62 U/L (ref 39–117)
BUN: 15 mg/dL (ref 6–23)
CO2: 26 meq/L (ref 19–32)
Calcium: 9.2 mg/dL (ref 8.4–10.5)
Chloride: 107 meq/L (ref 96–112)
Creatinine, Ser: 0.75 mg/dL (ref 0.40–1.20)
GFR: 86.94 mL/min (ref >60.00)
Glucose, Bld: 92 mg/dL (ref 70–99)
Potassium: 3.8 meq/L (ref 3.5–5.1)
Sodium: 137 meq/L (ref 135–145)
Total Bilirubin: 0.5 mg/dL (ref 0.2–1.2)
Total Protein: 7.4 g/dL (ref 6.0–8.3)

## 2023-10-11 LAB — LIPID PANEL
Cholesterol: 288 mg/dL — ABNORMAL HIGH (ref 0–200)
HDL: 50.2 mg/dL (ref >39.00)
LDL Cholesterol: 212 mg/dL — ABNORMAL HIGH (ref 0–99)
NonHDL: 238.22
Total CHOL/HDL Ratio: 6
Triglycerides: 130 mg/dL (ref 0.0–149.0)
VLDL: 26 mg/dL (ref 0.0–40.0)

## 2023-10-11 NOTE — Patient Instructions (Signed)
 Please return in 12 months for your annual complete physical; please come fasting.   I will release your lab results to you on your MyChart account with further instructions. You may see the results before I do, but when I review them I will send you a message with my report or have my assistant call you if things need to be discussed. Please reply to my message with any questions. Thank you!   If you have any questions or concerns, please don't hesitate to send me a message via MyChart or call the office at [phone]. Thank you for visiting with [place] today! It's our pleasure caring for you.   Preventive Care 60-60 Years Old, Female Preventive care refers to lifestyle choices and visits with your health care provider that can promote health and wellness. Preventive care visits are also called wellness exams. What can I expect for my preventive care visit? Counseling Your health care provider may ask you questions about your: Medical history, including: Past medical problems. Family medical history. Pregnancy history. Current health, including: Menstrual cycle. Method of birth control. Emotional well-being. Home life and relationship well-being. Sexual activity and sexual health. Lifestyle, including: Alcohol, nicotine or tobacco, and drug use. Access to firearms. Diet, exercise, and sleep habits. Work and work Astronomer. Sunscreen use. Safety issues such as seatbelt and bike helmet use. Physical exam Your health care provider will check your: Height and weight. These may be used to calculate your BMI (body mass index). BMI is a measurement that tells if you are at a healthy weight. Waist circumference. This measures the distance around your waistline. This measurement also tells if you are at a healthy weight and may help predict your risk of certain diseases, such as type 2 diabetes and high blood pressure. Heart rate and blood pressure. Body temperature. Skin for abnormal  spots. What immunizations do I need?  Vaccines are usually given at various ages, according to a schedule. Your health care provider will recommend vaccines for you based on your age, medical history, and lifestyle or other factors, such as travel or where you work. What tests do I need? Screening Your health care provider may recommend screening tests for certain conditions. This may include: Lipid and cholesterol levels. Diabetes screening. This is done by checking your blood sugar (glucose) after you have not eaten for a while (fasting). Pelvic exam and Pap test. Hepatitis B test. Hepatitis C test. HIV (human immunodeficiency virus) test. STI (sexually transmitted infection) testing, if you are at risk. Lung cancer screening. Colorectal cancer screening. Mammogram. Talk with your health care provider about when you should start having regular mammograms. This may depend on whether you have a family history of breast cancer. BRCA-related cancer screening. This may be done if you have a family history of breast, ovarian, tubal, or peritoneal cancers. Bone density scan. This is done to screen for osteoporosis. Talk with your health care provider about your test results, treatment options, and if necessary, the need for more tests. Follow these instructions at home: Eating and drinking  Eat a diet that includes fresh fruits and vegetables, whole grains, lean protein, and low-fat dairy products. Take vitamin and mineral supplements as recommended by your health care provider. Do not drink alcohol if: Your health care provider tells you not to drink. You are pregnant, may be pregnant, or are planning to become pregnant. If you drink alcohol: Limit how much you have to 0-1 drink a day. Know how much alcohol is in your  drink. In the [place] one drink equals one 12 oz bottle of beer (355 mL), one 5 oz glass of wine (148 mL), or one 1 oz glass of hard liquor (44 mL). Lifestyle Brush your  teeth every morning and night with fluoride toothpaste. Floss one time each day. Exercise for at least 30 minutes 5 or more days each week. Do not use any products that contain nicotine or tobacco. These products include cigarettes, chewing tobacco, and vaping devices, such as e-cigarettes. If you need help quitting, ask your health care provider. Do not use drugs. If you are sexually active, practice safe sex. Use a condom or other form of protection to prevent STIs. If you do not wish to become pregnant, use a form of birth control. If you plan to become pregnant, see your health care provider for a prepregnancy visit. Take aspirin only as told by your health care provider. Make sure that you understand how much to take and what form to take. Work with your health care provider to find out whether it is safe and beneficial for you to take aspirin daily. Find healthy ways to manage stress, such as: Meditation, yoga, or listening to music. Journaling. Talking to a trusted person. Spending time with friends and family. Minimize exposure to UV radiation to reduce your risk of skin cancer. Safety Always wear your seat belt while driving or riding in a vehicle. Do not drive: If you have been drinking alcohol. Do not ride with someone who has been drinking. When you are tired or distracted. While texting. If you have been using any mind-altering substances or drugs. Wear a helmet and other protective equipment during sports activities. If you have firearms in your house, make sure you follow all gun safety procedures. Seek help if you have been physically or sexually abused. What's next? Visit your health care provider once a year for an annual wellness visit. Ask your health care provider how often you should have your eyes and teeth checked. Stay up to date on all vaccines. This information is not intended to replace advice given to you by your health care provider. Make sure you discuss any  questions you have with your health care provider. Document Revised: 08/25/2020 Document Reviewed: 08/25/2020 Elsevier Patient Education  2024 [employer].

## 2023-10-11 NOTE — Progress Notes (Signed)
 Subjective  Chief Complaint  Patient presents with   Annual Exam    Pt here for Annual Exam and is currently fasting     HPI: Victoria Bishop is a 58 y.o. female who presents to [facility] [place] today for a Female Wellness Visit. She also has the concerns and/or needs as listed above in the chief complaint. These will be addressed in addition to the Health Maintenance Visit.   Wellness Visit: annual visit with health maintenance review and exam  HM: mamm current. Due pap smear. Reviewed labs from march. Imms current. Doing well.  Chronic disease f/u and/or acute problem visit: (deemed necessary to be done in addition to the wellness visit): Depression and anxiety: Stable on hydroxyzine  and sertraline  50 mg daily. Sees neurology for follow-up of migraines and meningioma status post section.  On antiepileptics.  Fortunately has been stable. Hyperlipidemia: Due for recheck, recommended starting Crestor in March due to LDL in the 190s. Only taking 2-3 x week. Chronic back pain muscle relaxers intermittently.  Assessment  1. Encounter for well adult exam with abnormal findings   2. Cervical cancer screening   3. Meningioma (HCC)   4. Partial epilepsy (HCC)   5. Mixed hyperlipidemia   6. Major depression, recurrent, chronic (HCC)   7. Migraine with aura and without status migrainosus, not intractable   8. Chronic right-sided low back pain without sciatica      Plan  Female Wellness Visit: Age appropriate Health Maintenance and Prevention measures were discussed with patient. Included topics are cancer screening recommendations, ways to keep healthy (see AVS) including dietary and exercise recommendations, regular eye and dental care, use of seat belts, and avoidance of moderate alcohol use and tobacco use.  BMI: discussed patient's BMI and encouraged positive lifestyle modifications to help get to or maintain a target BMI.  Pap smear done today.  Other screens are  current HM needs and immunizations were addressed and ordered. See below for orders. See HM and immunization section for updates. Routine labs and screening tests ordered including cmp, cbc and lipids where appropriate. Discussed recommendations regarding Vit D and calcium  supplementation (see AVS)  Chronic disease management visit and/or acute problem visit: Hyperlipidemia: Intolerant to statins daily but taking 2-3 times per week.  Recheck lipids today.  Check LFTs Neurologically stable with history of epilepsy and meningioma.  Continues on antiepileptics and migraine medications that are working well Depression is well-controlled on antidepressant Back pain is stable Follow up: 12 months for complete physical Orders Placed This Encounter  Procedures   Lipid panel   Comprehensive metabolic panel with GFR   No orders of the defined types were placed in this encounter.     Body mass index is 29.26 kg/m. Wt Readings from Last 3 Encounters:  10/11/23 160 lb (72.6 kg)  10/05/23 161 lb (73 kg)  09/25/23 159 lb (72.1 kg)     Patient Active Problem List   Diagnosis Date Noted   Mixed hyperlipidemia 05/03/2020    Priority: High   Meningioma (HCC) 07/07/2016    Priority: High    S/p resection    Major depression, recurrent, chronic (HCC) 06/23/2016    Priority: High   Partial epilepsy (HCC) 10/01/2015    Priority: High   Migraine without status migrainosus, not intractable 10/01/2015    Priority: High   Chronic right-sided low back pain without sciatica 02/12/2017    Priority: Medium    Helicobacter pylori (H. pylori) infection 08/17/2015  Priority: Medium     treated    Gastroesophageal reflux disease with esophagitis 08/13/2015    Priority: Medium    Atypical chest pain 06/06/2023    Priority: Low    May 2023 had palpitations with benign monitor 07/13/21 only PAC/PVC's < 1% total beats read by Dr Inocencio  Coronary calcium  score ) 2024, Dr. Delford.  Normal coronaries     Sensorineural hearing loss (SNHL) of both ears 06/06/2023    Priority: Low   Lipoma of torso 02/12/2017    Priority: Low   Exposure to communicable disease 12/01/2007    Priority: Low    Exposed to TB in August, 2009. No current symptoms.    Health Maintenance  Topic Date Due   Hepatitis B Vaccines (1 of 3 - 19+ 3-dose series) Never done   Cervical Cancer Screening (HPV/Pap Cotest)  07/07/2021   INFLUENZA VACCINE  10/12/2023   MAMMOGRAM  06/25/2024   Colonoscopy  04/29/2025   DTaP/Tdap/Td (3 - Td or Tdap) 08/18/2031   Hepatitis C Screening  Completed   HIV Screening  Completed   Zoster Vaccines- Shingrix  Completed   HPV VACCINES  Aged Out   Meningococcal B Vaccine  Aged Out   COVID-19 Vaccine  Discontinued   Immunization History  Administered Date(s) Administered   Influenza Split 11/29/2007, 01/22/2009, 12/31/2009, 12/23/2010   Influenza,inj,Quad PF,6+ Mos 12/31/2014, 02/12/2017, 01/08/2018, 11/08/2018, 03/13/2020, 01/12/2021, 03/01/2022   Influenza,inj,quad, With Preservative 01/21/2016   Influenza-Unspecified 03/17/2008   PFIZER(Purple Top)SARS-COV-2 Vaccination 05/23/2019, 06/16/2019, 03/19/2020   PPD Test 11/29/2007   Tdap 02/10/2011, 08/17/2021   Zoster Recombinant(Shingrix) 05/03/2020, 05/09/2021   We updated and reviewed the patient's past history in detail and it is documented below. Allergies: Patient has no known allergies. Past Medical History Patient  has a past medical history of Anxiety, Brain tumor (HCC) (2013), Depression, Epilepsy (HCC), GERD (gastroesophageal reflux disease), Meningitis (2013), Migraines, and Seizures (HCC). Past Surgical History Patient  has a past surgical history that includes Cesarean section; Brain surgery (2014); and CT RADIATION THERAPY GUIDE (2014). Family History: Patient family history includes Anxiety disorder in her daughter and son; Hearing loss in her son; Hyperlipidemia in her father; Hypertension in her father and  mother; Other in her daughter. Social History:  Patient  reports that she has never smoked. She has never used smokeless tobacco. She reports current drug use. Drug: Barbituates. She reports that she does not drink alcohol.  Review of Systems: Constitutional: negative for fever or malaise Ophthalmic: negative for photophobia, double vision or loss of vision Cardiovascular: negative for chest pain, dyspnea on exertion, or new LE swelling Respiratory: negative for SOB or persistent cough Gastrointestinal: negative for abdominal pain, change in bowel habits or melena Genitourinary: negative for dysuria or gross hematuria, no abnormal uterine bleeding or disharge Musculoskeletal: negative for new gait disturbance or muscular weakness Integumentary: negative for new or persistent rashes, no breast lumps Neurological: negative for TIA or stroke symptoms Psychiatric: negative for SI or delusions Allergic/Immunologic: negative for hives  Patient Care Team    Relationship Specialty Notifications Start End  Jodie Lavern CROME, MD PCP - General Family Medicine  06/06/23   Delford Maude BROCKS, MD PCP - Cardiology Cardiology  03/02/22   Georjean Darice HERO, MD Consulting Physician Neurology  03/14/18     Objective  Vitals: BP 100/70   Pulse 65   Temp 98.2 F (36.8 C)   Ht 5' 2 (1.575 m)   Wt 160 lb (72.6 kg)   LMP  07/12/2014 (LMP Unknown)   SpO2 97%   BMI 29.26 kg/m  General:  Well developed, well nourished, no acute distress  Psych:  Alert and orientedx3,normal mood and affect HEENT:  Normocephalic, atraumatic, non-icteric sclera,  supple neck without adenopathy, mass or thyromegaly Cardiovascular:  Normal S1, S2, RRR without gallop, rub or murmur Respiratory:  Good breath sounds bilaterally, CTAB with normal respiratory effort Gastrointestinal: normal bowel sounds, soft, non-tender, no noted masses. No HSM MSK: extremities without edema, joints without erythema or swelling Neurologic:    Mental  status is normal.  Gross motor and sensory exams are normal.  No tremor Pelvic Exam: Normal external genitalia, no vulvar or vaginal lesions present. Clear cervix w/o CMT. Bimanual exam reveals a nontender fundus w/o masses, nl size. No adnexal masses present. No inguinal adenopathy. A PAP smear was performed.    Commons side effects, risks, benefits, and alternatives for medications and treatment plan prescribed today were discussed, and the patient expressed understanding of the given instructions. Patient is instructed to call or message via MyChart if he/she has any questions or concerns regarding our treatment plan. No barriers to understanding were identified. We discussed Red Flag symptoms and signs in detail. Patient expressed understanding regarding what to do in case of urgent or emergency type symptoms.  Medication list was reconciled, printed and provided to the patient in AVS. Patient instructions and summary information was reviewed with the patient as documented in the AVS. This note was prepared with assistance of Dragon voice recognition software. Occasional wrong-word or sound-a-like substitutions may have occurred due to the inherent limitations of voice recognition software

## 2023-10-12 ENCOUNTER — Ambulatory Visit: Payer: Self-pay | Admitting: Neurology

## 2023-10-15 LAB — CYTOLOGY - PAP
Comment: NEGATIVE
Diagnosis: NEGATIVE
High risk HPV: NEGATIVE

## 2023-10-22 ENCOUNTER — Encounter: Payer: Self-pay | Admitting: Neurology

## 2023-10-24 ENCOUNTER — Ambulatory Visit: Payer: Self-pay | Admitting: Family Medicine

## 2023-10-24 ENCOUNTER — Other Ambulatory Visit: Payer: Self-pay

## 2023-10-24 DIAGNOSIS — E7800 Pure hypercholesterolemia, unspecified: Secondary | ICD-10-CM

## 2023-10-24 NOTE — Progress Notes (Signed)
 Please call pt: cholesterol levels are extremely high. Please refer to lipid clinic so they can discuss treatment options to bring levels down to a safer level.  Pap smear is normal. Other labs are all stable.

## 2023-11-03 ENCOUNTER — Other Ambulatory Visit

## 2023-11-20 ENCOUNTER — Telehealth: Payer: Self-pay | Admitting: Neurology

## 2023-11-20 NOTE — Telephone Encounter (Signed)
No answer will send mychart message

## 2023-11-20 NOTE — Telephone Encounter (Signed)
 Pt called and LM with AN. She wants to talk with someone about her DL

## 2023-11-20 NOTE — Telephone Encounter (Signed)
 Pt. Cld to speak with Dr. Ellena nurse but leona not provide about what, I cld

## 2023-11-20 NOTE — Telephone Encounter (Signed)
 Pt is returning a call

## 2023-11-21 NOTE — Telephone Encounter (Signed)
 No answer, will send my chart message again.

## 2023-12-24 ENCOUNTER — Ambulatory Visit (INDEPENDENT_AMBULATORY_CARE_PROVIDER_SITE_OTHER): Admitting: Internal Medicine

## 2023-12-24 VITALS — BP 90/60 | HR 88 | Ht 62.0 in | Wt 164.0 lb

## 2023-12-24 DIAGNOSIS — E7800 Pure hypercholesterolemia, unspecified: Secondary | ICD-10-CM | POA: Diagnosis not present

## 2023-12-24 NOTE — Progress Notes (Signed)
 LIPID CLINIC CONSULT NOTE  Chief Complaint:  Familial hyperlipidemia  Primary Care Physician: Jodie Lavern CROME, MD  Primary Cardiologist:  Maude Emmer, MD  HPI:  Victoria Bishop is a 58 y.o. female who is being seen today for the evaluation of familial hyperlipidemia at the request of Jodie Lavern CROME, MD. This is a pleasant 58 year old female kindly referred for evaluation management of possible familial hyperlipidemia.  She has a history of high cholesterol and family history of high cholesterol in her sister.  She had previously underwent coronary CT angiography in December 2023 for evaluation of chest pain.  This showed no evidence of coronary artery disease and 0 coronary calcium .  Despite this her cholesterol has continued to elevate.  She could only tolerate 10 mg atorvastatin  but she has significant myalgias/arthralgias with this.  Recent lipids in July showed total cholesterol 288, triglycerides 130, HDL 50 and LDL 212.  Overall diet seems to be fairly low in saturated fats.  PMHx:  Past Medical History:  Diagnosis Date   Anxiety    Brain tumor (HCC) 2013   Depression    Epilepsy (HCC)    GERD (gastroesophageal reflux disease)    Meningitis 2013   Migraines    Seizures (HCC)     Past Surgical History:  Procedure Laterality Date   BRAIN SURGERY  2014   CESAREAN SECTION     CT RADIATION THERAPY GUIDE  2014    FAMHx:  Family History  Problem Relation Age of Onset   Hypertension Mother    Hypertension Father    Hyperlipidemia Father    Anxiety disorder Daughter    Other Daughter        vader syndrome   Hearing loss Son    Anxiety disorder Son    Diabetes Neg Hx    Cancer Neg Hx    Breast cancer Neg Hx     SOCHx:   reports that she has never smoked. She has never used smokeless tobacco. She reports current drug use. Drug: Barbituates. She reports that she does not drink alcohol.  ALLERGIES:  No Known Allergies  ROS: Pertinent items noted in HPI and  remainder of comprehensive ROS otherwise negative.  HOME MEDS: Current Outpatient Medications on File Prior to Visit  Medication Sig Dispense Refill   atorvastatin  (LIPITOR) 10 MG tablet Take 1 tablet (10 mg total) by mouth daily. 90 tablet 1   azelaic acid  (AZELEX ) 20 % cream Apply topically 2 (two) times daily. After skin is thoroughly washed and patted dry, gently but thoroughly massage a thin film of azelaic acid  cream into the affected area twice daily, in the morning and evening. 30 g 0   cyclobenzaprine  (FLEXERIL ) 10 MG tablet Take 1 tablet (10 mg total) by mouth 3 (three) times daily as needed for muscle spasms. 30 tablet 1   diclofenac  Sodium (VOLTAREN ) 1 % GEL Apply 4 g topically 4 (four) times daily. 150 g 0   fluticasone  (FLONASE ) 50 MCG/ACT nasal spray Place 1 spray into both nostrils daily. 16 g 6   gabapentin  (NEURONTIN ) 100 MG capsule Take 1 capsule every night 90 capsule 3   hydrOXYzine  (ATARAX ) 25 MG tablet Take 1 tablet (25 mg total) by mouth 3 (three) times daily as needed for anxiety. 60 tablet 5   sertraline  (ZOLOFT ) 50 MG tablet Take 3 tablets (150 mg total) by mouth at bedtime. 270 tablet 3   SUMAtriptan  (IMITREX ) 100 MG tablet TAKE 1 TABLET BY MOUTH AT FIRST  SIGN OF MIGRAINE-MAY REPEAT IN 2 HRS-MAX 2 TABLES/24HOURS 9 tablet 11   terbinafine  (LAMISIL ) 250 MG tablet Take 1 tablet (250 mg total) by mouth daily. 30 tablet 2   topiramate  (TOPAMAX ) 100 MG tablet Take 1 and 1/2 tablets (150mg ) every morning, take 2 tablets every evening (200mg ) 315 tablet 3   No current facility-administered medications on file prior to visit.    LABS/IMAGING: No results found for this or any previous visit (from the past 48 hours). No results found.  LIPID PANEL:    Component Value Date/Time   CHOL 288 (H) 10/11/2023 1132   TRIG 130.0 10/11/2023 1132   HDL 50.20 10/11/2023 1132   CHOLHDL 6 10/11/2023 1132   VLDL 26.0 10/11/2023 1132   LDLCALC 212 (H) 10/11/2023 1132   LDLDIRECT  173.0 03/14/2018 1538    No results found for: LIPOA   WEIGHTS: Wt Readings from Last 3 Encounters:  12/24/23 164 lb (74.4 kg)  10/11/23 160 lb (72.6 kg)  10/05/23 161 lb (73 kg)    VITALS: BP 90/60 (BP Location: Left Arm, Patient Position: Sitting, Cuff Size: Normal)   Pulse 88   Ht 5' 2 (1.575 m)   Wt 164 lb (74.4 kg)   LMP 07/12/2014 (LMP Unknown)   SpO2 98%   BMI 30.00 kg/m   EXAM: Deferred  EKG: Deferred  ASSESSMENT: Probable familial hyperlipidemia, LDL greater than 190 Family history of sister with high cholesterol 0 detectable coronary artery disease by coronary CTA (02/2022)  PLAN: 1.   Ms. Snowberger has a probable familial hyperlipidemia with LDL greater than 190.  Recent LDL of 212 despite being on low-dose atorvastatin .  This is the most medicine she can tolerate.  She may ultimately be a good candidate to add a PCSK9 inhibitor to.  I would like to assess an LP(a) which could explain her high cholesterol.  As to why she has not developed any early onset heart disease, there are some variants which are associated with lower risk and may cause elevated serum levels of cholesterol however full unknown reasons the cholesterol is not able to enter the vasculature.  Genetic testing could be very helpful to determine whether she might have a low risk or potentially high risk variant.  This is a send out test that takes several weeks to get back and likely will be covered by insurance but if not could lead up to $299 charge.  She is agreeable to this today.  Once we get the results back from that and her LP(a) assessment, I could then recommend further treatment options.  She is agreeable to this plan.  Thanks again for the kind referral.  Vinie KYM Maxcy, MD, [facility], FNLA, FACP  [place]  [facility]  Medical Director of the Advanced Lipid Disorders &  Cardiovascular Risk Reduction Clinic Diplomate of the American Board of Clinical Lipidology Attending  Cardiologist  Direct Dial: [phone]  Fax: [phone]  Website:  [email or web address] Vinie BROCKS Ziana Heyliger 12/24/2023, 3:09 PM

## 2023-12-24 NOTE — Patient Instructions (Signed)
 Medication Instructions:  NO CHANGES  *If you need a refill on your cardiac medications before your next appointment, please call your pharmacy*  Lab Work: LPa today   If you have labs (blood work) drawn today and your tests are completely normal, you will receive your results only by: MyChart Message (if you have MyChart) OR A paper copy in the mail If you have any lab test that is abnormal or we need to change your treatment, we will call you to review the results.  Testing/Procedures: Genetic Test -- results available in 2-3 weeks via MyChart  Follow-Up: At [facility], you and your health needs are our priority.  As part of our continuing mission to provide you with exceptional heart care, our providers are all part of one team.  This team includes your primary Cardiologist (physician) and Advanced Practice Providers or APPs (Physician Assistants and Nurse Practitioners) who all work together to provide you with the care you need, when you need it.  Your next appointment:    AS NEEDED, pending genetic test results + treatment plan  We recommend signing up for the patient portal called MyChart.  Sign up information is provided on this After Visit Summary.  MyChart is used to connect with patients for Virtual Visits (Telemedicine).  Patients are able to view lab/test results, encounter notes, upcoming appointments, etc.  Non-urgent messages can be sent to your provider as well.   To learn more about what you can do with MyChart, go to [email or web address].   Other Instructions

## 2023-12-26 LAB — LIPOPROTEIN A (LPA): Lipoprotein (a): 9.7 nmol/L (ref <75.0)

## 2024-01-06 ENCOUNTER — Ambulatory Visit: Payer: Self-pay | Admitting: Internal Medicine

## 2024-01-23 ENCOUNTER — Encounter (HOSPITAL_BASED_OUTPATIENT_CLINIC_OR_DEPARTMENT_OTHER): Payer: Self-pay | Admitting: *Deleted

## 2024-02-18 ENCOUNTER — Other Ambulatory Visit

## 2024-02-18 ENCOUNTER — Ambulatory Visit (INDEPENDENT_AMBULATORY_CARE_PROVIDER_SITE_OTHER): Admitting: Neurology

## 2024-02-18 ENCOUNTER — Encounter: Payer: Self-pay | Admitting: Neurology

## 2024-02-18 VITALS — BP 112/75 | HR 88 | Ht 62.0 in | Wt 156.8 lb

## 2024-02-18 DIAGNOSIS — G43009 Migraine without aura, not intractable, without status migrainosus: Secondary | ICD-10-CM | POA: Diagnosis not present

## 2024-02-18 DIAGNOSIS — G40209 Localization-related (focal) (partial) symptomatic epilepsy and epileptic syndromes with complex partial seizures, not intractable, without status epilepticus: Secondary | ICD-10-CM

## 2024-02-18 DIAGNOSIS — D329 Benign neoplasm of meninges, unspecified: Secondary | ICD-10-CM

## 2024-02-18 MED ORDER — GABAPENTIN 100 MG PO CAPS: ORAL_CAPSULE | ORAL | 3 refills | Status: AC

## 2024-02-18 MED ORDER — SUMATRIPTAN SUCCINATE 100 MG PO TABS: ORAL_TABLET | ORAL | 11 refills | Status: AC

## 2024-02-18 MED ORDER — TOPIRAMATE 100 MG PO TABS: ORAL_TABLET | ORAL | 3 refills | Status: AC

## 2024-02-18 NOTE — Progress Notes (Signed)
 NEUROLOGY FOLLOW UP OFFICE NOTE  Victoria Bishop [id number] [birth year]  Discussed the use of AI scribe software for clinical note transcription with the patient, who gave verbal consent to proceed.  History of Present Illness I had the pleasure of seeing Victoria Bishop in follow-up in the neurology clinic on 02/18/2024.  The patient was last seen 5 months ago for seizures and migraines. She is alone in the office today. Records and images were personally reviewed where available.  On her last visit, she denied any bigger seizures since 12/2022 but reported episodes of left arm/leg weakness. We discussed repeating brain MRI, last was in 2023. Her 1-hour EEG in 09/2023 showed occasional left temporal slowing, no epileptiform discharges. She denies any staring/unresponsive episodes, gaps in time, olfactory/gustatory hallucinations, focal numbness/tingling/weakness, myoclonic jerks. She has not had the episodes of left-sided weakness recently. Migraines are infrequent, primarily triggered by stress or poor sleep. She has prn sumatriptan  for rescue. She takes Topiramate  150mg  in AM, 200mg  in PM (100mg  1.5 tabs in AM, 2 tabs in PM) and Gabapentin  100mg  at bedtime for seizure and migraine prophylaxis without side effects. Mood and sleep are good.   She reports balance changes due to hearing issues on the left side.    History on Initial Assessment 05/11/2017: This is a 58 yo RH woman presenting for migraines, meningioma resection, and seizures. She recently moved from [place] , records from her Neurologist in [place], [place] were reviewed. She started having seizures in July 2000 while still living in [place]. At that time she had eclampsia during delivery, she had pain radiating to her head then lost awareness. She was admitted to the hospital for a month and had renal failure requiring dialysis. Per report, 3 days later, she had seizures. There is an MRI brain report from July 2000 was concerning for PRES,  reporting abnormal foci of signal intensity involving the cortical and subcortical regions of the posterior parietal and occipital lobes in a fairly symmetrical fashion. The largest area of abnormal signal involves the left posterior parietal lobe. Initially she was having episodes of loss of consciousness with convulsions. She was initially started on Tegretol and Dilantin immediately post-partum, and had been very well controlled on carbamazepine XR for several years. There is an EEG report from 11/2005 quoted from [facility] reporting epileptiform activity generalized, although there was not significant associated clinical motor activity. She had an EEG in [place]  in 04/2006 reporting occasional sharp and slow wave complexes that appear epileptiform over the left anterior temporal regions. They deny any convulsions since 2000. Since then, she has been having recurrent stereotyped episodes starting with left-sided numbness and tingling on the arm, leg, face, and left side of her tongue. The left side of her head feels like it is going weak. She describes electric shocks. There is no jerking or twitching but she describes them as spasms, then she feels weaker on the left side after. She cannot concentrate when this occurs, she speaks but slows down a lot, keeps saying uhm for several minutes. Apparently she can still function and do things when this occurs. She is sleepy afterwards and wakes up feeling better, but her daughter notices it takes a couple of days to return to her normal baseline. There is no associated headaches. She has difficulty saying how often they are, sometimes occurring twice a week, other times every other week. Last episode was 1.5 weeks ago. When she has them at night, she has really bad dreams. Her husband thinks she  is having nightmares but when she wakes up she feels different. She denies any olfactory/gustatory hallucinations, rising epigastric sensation. She has noticed some gaps in  time, her daughter has to repeat things some times.    Records from [place]  were reviewed. She was transitioned off carbamazepine to zonisamide in 2010 due to concerns for osteoporosis, however had abdominal pain and stomach burning, and was switched to Levetiracetam. 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 She reports that headaches worsened after the surgery and was started on Topiramate  in 2015. She feels this has helped with the headaches but not the seizures. She is taking Topiramate  50mg  BID. Higher doses in the past caused side effects. She reports stopping the Keppra last February 2018 because her mood swings were horrible and anxiety was heightened. This has increased seizures frequency, but the anxiety and mood swings are better. Her Zoloft  dose was also increased. She takes sumatriptan  around twice a week on average for the migraines. Sleep is good. She denies any dizziness, diplopia, dysarthria/dysphagia, neck/back pain, bowel/bladder dysfunction. They have noticed she has a cough that would not stop if she has mint or a drink. She   Diagnostic Data:  EEG in [place]  in 04/2006 reporting occasional sharp and slow wave complexes that appear epileptiform over the left anterior temporal regions.  03/20/2016 EEG normal wake and sleep EEG  MRI brain 09/02/2015: Stable to slightly decreased size of rounded, well-circumscribed, homogenously  enhancing, extradural right cerebellar hemisphere lesion, again most consistent with a meningioma; this measures approximately 1.2 x 1.2 x 1.3 cm (AP x ML x CC) today. No new lesions suspicious for meningioma are seen. 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  MRI brain  without contrast 06/2021 no acute changes, there is a small focus of encephalomalacia in the left parietal lobe. The extra-axial lesion in the right posterior fossa is unchanged compared to 2021 (1.3 x 1.1 cm) with mild mass effect on the underlying brain parenchyma  Epilepsy Risk Factors:  PRES in 2000, left parietal encephalomalacia. She had meningioma resection in 2013. Otherwise she had a normal birth and early development.  There is no history of febrile convulsions, CNS infections such as meningitis/encephalitis, significant traumatic brain injury, or family history of seizures.   Prior AEDs: Tegretol, Dilantin, Keppra, Zonisamide   PAST MEDICAL HISTORY: Past Medical History:  Diagnosis Date   Anxiety    Brain tumor (HCC) 2013   Depression    Epilepsy (HCC)    GERD (gastroesophageal reflux disease)    Meningitis 2013   Migraines    Seizures (HCC)     MEDICATIONS: Current Outpatient Medications on File Prior to Visit  Medication Sig Dispense Refill   atorvastatin  (LIPITOR) 10 MG tablet Take 1 tablet (10 mg total) by mouth daily. 90 tablet 1   azelaic acid  (AZELEX ) 20 % cream Apply topically 2 (two) times daily. After skin is thoroughly washed and patted dry, gently but thoroughly massage a thin film of azelaic acid  cream into the affected area twice daily, in the morning and evening. 30 g 0   cyclobenzaprine  (FLEXERIL ) 10 MG tablet Take 1 tablet (10 mg total) by mouth 3 (three) times daily as needed for muscle spasms.  30 tablet 1   diclofenac  Sodium (VOLTAREN ) 1 % GEL Apply 4 g topically 4 (four) times daily. 150 g 0   fluticasone  (FLONASE ) 50 MCG/ACT nasal spray Place 1 spray into both nostrils daily. 16 g 6   gabapentin  (NEURONTIN ) 100 MG capsule Take 1 capsule every night 90 capsule 3   hydrOXYzine  (ATARAX ) 25 MG tablet Take 1 tablet (25 mg total) by mouth 3 (three) times daily as needed for anxiety. 60 tablet 5   sertraline  (ZOLOFT ) 50 MG  tablet Take 3 tablets (150 mg total) by mouth at bedtime. 270 tablet 3   SUMAtriptan  (IMITREX ) 100 MG tablet TAKE 1 TABLET BY MOUTH AT FIRST SIGN OF MIGRAINE-MAY REPEAT IN 2 HRS-MAX 2 TABLES/24HOURS 9 tablet 11   terbinafine  (LAMISIL ) 250 MG tablet Take 1 tablet (250 mg total) by mouth daily. 30 tablet 2   topiramate  (TOPAMAX ) 100 MG tablet Take 1 and 1/2 tablets (150mg ) every morning, take 2 tablets every evening (200mg ) 315 tablet 3   No current facility-administered medications on file prior to visit.    ALLERGIES: No Known Allergies  FAMILY HISTORY: Family History  Problem Relation Age of Onset   Hypertension Mother    Hypertension Father    Hyperlipidemia Father    Anxiety disorder Daughter    Other Daughter        vader syndrome   Hearing loss Son    Anxiety disorder Son    Diabetes Neg Hx    Cancer Neg Hx    Breast cancer Neg Hx     SOCIAL HISTORY: Social History   Socioeconomic History   Marital status: Married    Spouse name: Not on file   Number of children: Not on file   Years of education: Not on file   Highest education level: 9th grade  Occupational History   Occupation: food Control And Instrumentation Engineer: [employer]  Tobacco Use   Smoking status: Never   Smokeless tobacco: Never  Vaping Use   Vaping status: Never Used  Substance and Sexual Activity   Alcohol use: No   Drug use: Yes    Types: Barbituates   Sexual activity: Yes    Birth control/protection: None  Other Topics Concern   Not on file  Social History Narrative   Originally from [place], non smoker and non drinker, lives with husband and a son; 3 children (2 daughters and 1 son), 1 daughter passed at age 75       Lives in 1 story home   9th grade education   Works with [employer]   Right handed    5 steps one door and 3 steps on other   Social Drivers of Health   Financial Resource Strain: Low Risk  (04/18/2022)   Overall Financial Resource Strain (CARDIA)     Difficulty of Paying Living Expenses: Not very hard  Food Insecurity: No Food Insecurity (04/18/2022)   Hunger Vital Sign    Worried About Running Out of Food in the Last Year: Never true    Ran Out of Food in the Last Year: Never true  Transportation Needs: No Transportation Needs (04/18/2022)   PRAPARE - Administrator, Civil Service (Medical): No    Lack of Transportation (Non-Medical): No  Physical Activity: Insufficiently Active (04/18/2022)   Exercise Vital Sign    Days of Exercise per Week: 2 days    Minutes of Exercise per Session: 10 min  Stress: No Stress Concern Present (  04/18/2022)   [employer] - Occupational Stress Questionnaire    Feeling of Stress : Only a little  Social Connections: Moderately Isolated (04/18/2022)   Social Connection and Isolation Panel    Frequency of Communication with Friends and Family: More than three times a week    Frequency of Social Gatherings with Friends and Family: Twice a week    Attends Religious Services: Never    Database Administrator or Organizations: No    Attends Engineer, Structural: Not on file    Marital Status: Married  Catering Manager Violence: Not on file     PHYSICAL EXAM: Vitals:   02/18/24 1027  BP: 112/75  Pulse: 88  SpO2: 98%   General: No acute distress Head:  Normocephalic/atraumatic Skin/Extremities: No rash, no edema Neurological Exam: alert and awake. No aphasia or dysarthria. Fund of knowledge is appropriate.  Attention and concentration are normal.   Cranial nerves: Pupils equal, round. Extraocular movements intact with no nystagmus. Visual fields full.  No facial asymmetry.  Motor: Bulk and tone normal, muscle strength 5/5 throughout with no pronator drift.   Finger to nose testing intact.  Gait narrow-based and steady, mild difficulty with tandem walk but able. Romberg negative.   IMPRESSION: This is a 58 yo RH woman with a history of migraines, meningioma  resection, and seizures suggestive of focal seizures without impairment of awareness, possibly arising from the right hemisphere, describing left-sided numbness and post-ictal weakness on the left side however a prior EEG reported left anterior temporal epileptiform discharges. Recent EEG showed occasional left temporal slowing. Last brain MRI done 06/2021 no acute changes with volume loss in the left posterior parietal region after developing PRES in 2000, unchanged meningioma in the right posterior fossa. No bigger seizures since 12/2022, migraine controlled on Topiramate  150mg  in AM, 200mg  in PM and Gabapentin  100mg  at bedtime. She has prn sumatriptan  for rescue. Proceed with interval brain MRI with and without contrast for meningioma follow-up and report of intermittent left-sided weakness (none recently). She is aware of [place] driving laws to stop driving after a seizure until 6 months seizure-free. She would like to resume driving, DMV forms will be filled out, check Topiramate  level. Follow-up in 6 months, call for any changes.    Thank you for allowing me to participate in her care.  Please do not hesitate to call for any questions or concerns.    Darice Shivers, M.D.   CC: Dr. Jodie

## 2024-02-18 NOTE — Patient Instructions (Signed)
 Good to see you doing well!  Call [facility] to reschedule the brain MRI  2. Continue Topiramate  100mg : take 1 and 1/2 tablets every morning, 2 tablets every evening  3. Continue Gabapentin  100mg  every night  4. Follow-up in 6 months, call for any changes   Seizure Precautions: 1. If medication has been prescribed for you to prevent seizures, take it exactly as directed.  Do not stop taking the medicine without talking to your doctor first, even if you have not had a seizure in a long time.   2. Avoid activities in which a seizure would cause danger to yourself or to others.  Don't operate dangerous machinery, swim alone, or climb in high or dangerous places, such as on ladders, roofs, or girders.  Do not drive unless your doctor says you may.  3. If you have any warning that you may have a seizure, lay down in a safe place where you can't hurt yourself.    4.  No driving for 6 months from last seizure, as per [place]  state law.   Please refer to the following link on the Epilepsy Foundation of America's website for more information: http://www.epilepsyfoundation.org/answerplace/Social/driving/drivingu.cfm   5.  Maintain good sleep hygiene. Avoid alcohol  6.  Contact your doctor if you have any problems that may be related to the medicine you are taking.  7.  Call 911 and bring the patient back to the ED if:        A.  The seizure lasts longer than 5 minutes.       B.  The patient doesn't awaken shortly after the seizure  C.  The patient has new problems such as difficulty seeing, speaking or moving  D.  The patient was injured during the seizure  E.  The patient has a temperature over 102 F (39C)  F.  The patient vomited and now is having trouble breathing

## 2024-02-20 LAB — TOPIRAMATE LEVEL: Topiramate Lvl: 3.2 ug/mL

## 2024-03-10 ENCOUNTER — Encounter (HOSPITAL_BASED_OUTPATIENT_CLINIC_OR_DEPARTMENT_OTHER): Payer: Self-pay | Admitting: Internal Medicine

## 2024-03-11 ENCOUNTER — Encounter: Payer: Self-pay | Admitting: Internal Medicine

## 2024-04-18 ENCOUNTER — Ambulatory Visit

## 2024-04-18 ENCOUNTER — Encounter: Payer: Self-pay | Admitting: Family Medicine

## 2024-04-18 ENCOUNTER — Ambulatory Visit: Admitting: Family Medicine

## 2024-04-18 VITALS — BP 124/86 | HR 79 | Temp 98.2°F | Ht 62.0 in | Wt 154.4 lb

## 2024-04-18 DIAGNOSIS — M546 Pain in thoracic spine: Secondary | ICD-10-CM

## 2024-04-18 DIAGNOSIS — S300XXA Contusion of lower back and pelvis, initial encounter: Secondary | ICD-10-CM

## 2024-04-18 DIAGNOSIS — Y9323 Activity, snow (alpine) (downhill) skiing, snow boarding, sledding, tobogganing and snow tubing: Secondary | ICD-10-CM

## 2024-04-18 DIAGNOSIS — R0789 Other chest pain: Secondary | ICD-10-CM

## 2024-04-18 MED ORDER — CYCLOBENZAPRINE HCL 10 MG PO TABS: 10.0000 mg | ORAL_TABLET | Freq: Every evening | ORAL | 1 refills | Status: AC | PRN

## 2024-04-18 MED ORDER — DICLOFENAC SODIUM 75 MG PO TBEC: 75.0000 mg | DELAYED_RELEASE_TABLET | Freq: Two times a day (BID) | ORAL | 0 refills | Status: AC

## 2024-04-18 NOTE — Progress Notes (Signed)
 "  Subjective  CC:  Chief Complaint  Patient presents with   Back Pain    Pt stated that she s been having some back pain since she fell in the snow    HPI: Victoria Bishop is a 59 y.o. female who presents to the office today to address the problems listed above in the chief complaint. Discussed the use of AI scribe software for clinical note transcription with the patient, who gave verbal consent to proceed.  History of Present Illness Victoria Bishop is a 59 year old female who presents with pain after a sledding accident. 59 year old who went sledding with her children and grandchildren last week in the snow.  She showed me a video.  Went down steep hill at the end there was a large ramp.  She caught air, was up about 3 feet in the air and landed directly on her thoracic spine.  She was able to get up unassisted.  Had immediate tailbone pain but could not walk and function normally.  The next day or 2 went on, more mid chest pain and describes a tightness or weird feeling when she takes a deep breath in.  No shortness of breath.  Sleeping okay.  Musculoskeletal pain - Pain localized to tailbone, back, and chest area following sledding accident - Back pain developed over the last couple of days - Pain is exacerbated by sitting - Discomfort led to cessation of sledding activity - Able to ambulate after the accident  Respiratory symptoms - Abnormal 'weird' sensation when breathing after the accident - No shortness of breath - No difficulty breathing   Assessment  1. Injury due to sledding accident   2. Acute bilateral thoracic back pain   3. Contusion of coccyx, initial encounter   4. Tightness in chest      Plan  Assessment and Plan Assessment & Plan Traumatic thoracic back and tailbone pain after fall Acute thoracic back and tailbone pain following a fall while sledding. Pain is localized to the tailbone and back, with tenderness upon palpation. No shortness of breath  or difficulty breathing, but altered sensation when breathing. Differential includes bruising or fracture of the tailbone. X-rays are necessary to rule out fracture and assess lung involvement. - Ordered x-ray of tailbone, thoracic spine, and lungs - Prescribed anti-inflammatory medication Voltaren  twice daily - Prescribed muscle relaxer Flexeril  nightly - Advised gentle stretching exercises    Follow up: prn Orders Placed This Encounter  Procedures   DG Sacrum/Coccyx   DG Thoracic Spine 2 View   DG Chest 2 View   Meds ordered this encounter  Medications   cyclobenzaprine  (FLEXERIL ) 10 MG tablet    Sig: Take 1 tablet (10 mg total) by mouth at bedtime as needed for muscle spasms (back pain).    Dispense:  30 tablet    Refill:  1   diclofenac  (VOLTAREN ) 75 MG EC tablet    Sig: Take 1 tablet (75 mg total) by mouth 2 (two) times daily.    Dispense:  30 tablet    Refill:  0     I reviewed the patients updated PMH, FH, and SocHx.  Patient Active Problem List   Diagnosis Date Noted   Mixed hyperlipidemia 05/03/2020    Priority: High   Meningioma (HCC) 07/07/2016    Priority: High   Major depression, recurrent, chronic 06/23/2016    Priority: High   Partial epilepsy (HCC) 10/01/2015    Priority: High   Migraine without status migrainosus,  not intractable 10/01/2015    Priority: High   Low back pain, episodic 02/12/2017    Priority: Medium    Helicobacter pylori (H. pylori) infection 08/17/2015    Priority: Medium    Gastroesophageal reflux disease with esophagitis 08/13/2015    Priority: Medium    Atypical chest pain 06/06/2023    Priority: Low   Sensorineural hearing loss (SNHL) of both ears 06/06/2023    Priority: Low   Lipoma of torso 02/12/2017    Priority: Low   Exposure to communicable disease 12/01/2007    Priority: Low   Active Medications[1] Allergies: Patient has no known allergies. Family History: Patient family history includes Anxiety disorder in her  daughter and son; Hearing loss in her son; Hyperlipidemia in her father; Hypertension in her father and mother; Other in her daughter. Social History:  Patient  reports that she has never smoked. She has never used smokeless tobacco. She reports current drug use. Drug: Barbituates. She reports that she does not drink alcohol.  Review of Systems: Constitutional: Negative for fever malaise or anorexia Cardiovascular: negative for chest pain Respiratory: negative for SOB or persistent cough Gastrointestinal: negative for abdominal pain  Objective  Vitals: BP 124/86   Pulse 79   Temp 98.2 F (36.8 C)   Ht 5' 2 (1.575 m)   Wt 154 lb 6.4 oz (70 kg)   LMP 07/12/2014   SpO2 97%   BMI 28.24 kg/m  General: no acute distress , A&Ox3, moves well.  No respiratory distress. Cardiovascular:  RRR without murmur or gallop.  Respiratory:  Good breath sounds bilaterally, CTAB with normal respiratory effort, anterior chest wall tenderness present Back: No thoracic spine tenderness, paravertebrals and rhomboids are tender, full range of motion.  Tender over coccyx.  Normal gait Skin:  Warm, no rashes Commons side effects, risks, benefits, and alternatives for medications and treatment plan prescribed today were discussed, and the patient expressed understanding of the given instructions. Patient is instructed to call or message via MyChart if he/she has any questions or concerns regarding our treatment plan. No barriers to understanding were identified. We discussed Red Flag symptoms and signs in detail. Patient expressed understanding regarding what to do in case of urgent or emergency type symptoms.  Medication list was reconciled, printed and provided to the patient in AVS. Patient instructions and summary information was reviewed with the patient as documented in the AVS. This note was prepared with assistance of Dragon voice recognition software. Occasional wrong-word or sound-a-like substitutions may  have occurred due to the inherent limitations of voice recognition software    [1]  Current Meds  Medication Sig   atorvastatin  (LIPITOR) 10 MG tablet Take 1 tablet (10 mg total) by mouth daily.   azelaic acid  (AZELEX ) 20 % cream Apply topically 2 (two) times daily. After skin is thoroughly washed and patted dry, gently but thoroughly massage a thin film of azelaic acid  cream into the affected area twice daily, in the morning and evening.   diclofenac  Sodium (VOLTAREN ) 1 % GEL Apply 4 g topically 4 (four) times daily.   fluticasone  (FLONASE ) 50 MCG/ACT nasal spray Place 1 spray into both nostrils daily.   gabapentin  (NEURONTIN ) 100 MG capsule Take 1 capsule every night   hydrOXYzine  (ATARAX ) 25 MG tablet Take 1 tablet (25 mg total) by mouth 3 (three) times daily as needed for anxiety.   sertraline  (ZOLOFT ) 50 MG tablet Take 3 tablets (150 mg total) by mouth at bedtime.   SUMAtriptan  (IMITREX ) 100  MG tablet TAKE 1 TABLET BY MOUTH AT FIRST SIGN OF MIGRAINE-MAY REPEAT IN 2 HRS-MAX 2 TABLES/24HOURS   topiramate  (TOPAMAX ) 100 MG tablet Take 1 and 1/2 tablets (150mg ) every morning, take 2 tablets every evening (200mg )   [DISCONTINUED] cyclobenzaprine  (FLEXERIL ) 10 MG tablet Take 1 tablet (10 mg total) by mouth 3 (three) times daily as needed for muscle spasms.   [DISCONTINUED] terbinafine  (LAMISIL ) 250 MG tablet Take 1 tablet (250 mg total) by mouth daily.   diclofenac  (VOLTAREN ) 75 MG EC tablet Take 1 tablet (75 mg total) by mouth 2 (two) times daily.   "

## 2024-09-03 ENCOUNTER — Ambulatory Visit: Admitting: Neurology
